# Patient Record
Sex: Female | Born: 1955 | Race: White | Hispanic: No | Marital: Married | Smoking: Never smoker
Health system: Southern US, Community
[De-identification: ages and names within clinical notes are randomized; demographics above are authoritative.]

## PROBLEM LIST (undated history)

## (undated) DIAGNOSIS — I4891 Unspecified atrial fibrillation: Secondary | ICD-10-CM

## (undated) DIAGNOSIS — M1990 Unspecified osteoarthritis, unspecified site: Secondary | ICD-10-CM

## (undated) DIAGNOSIS — J45909 Unspecified asthma, uncomplicated: Secondary | ICD-10-CM

## (undated) DIAGNOSIS — B54 Unspecified malaria: Secondary | ICD-10-CM

## (undated) DIAGNOSIS — F419 Anxiety disorder, unspecified: Secondary | ICD-10-CM

## (undated) DIAGNOSIS — I499 Cardiac arrhythmia, unspecified: Secondary | ICD-10-CM

## (undated) DIAGNOSIS — D649 Anemia, unspecified: Secondary | ICD-10-CM

## (undated) DIAGNOSIS — E785 Hyperlipidemia, unspecified: Secondary | ICD-10-CM

## (undated) DIAGNOSIS — J189 Pneumonia, unspecified organism: Secondary | ICD-10-CM

## (undated) DIAGNOSIS — K759 Inflammatory liver disease, unspecified: Secondary | ICD-10-CM

## (undated) DIAGNOSIS — G4733 Obstructive sleep apnea (adult) (pediatric): Secondary | ICD-10-CM

## (undated) DIAGNOSIS — K219 Gastro-esophageal reflux disease without esophagitis: Secondary | ICD-10-CM

## (undated) DIAGNOSIS — I10 Essential (primary) hypertension: Secondary | ICD-10-CM

## (undated) DIAGNOSIS — D369 Benign neoplasm, unspecified site: Secondary | ICD-10-CM

## (undated) DIAGNOSIS — A159 Respiratory tuberculosis unspecified: Secondary | ICD-10-CM

## (undated) DIAGNOSIS — Z9289 Personal history of other medical treatment: Secondary | ICD-10-CM

## (undated) DIAGNOSIS — F32A Depression, unspecified: Secondary | ICD-10-CM

## (undated) DIAGNOSIS — A6920 Lyme disease, unspecified: Secondary | ICD-10-CM

## (undated) DIAGNOSIS — R519 Headache, unspecified: Secondary | ICD-10-CM

## (undated) DIAGNOSIS — E063 Autoimmune thyroiditis: Secondary | ICD-10-CM

## (undated) DIAGNOSIS — Q249 Congenital malformation of heart, unspecified: Secondary | ICD-10-CM

## (undated) HISTORY — DX: Cardiac arrhythmia, unspecified: I49.9

## (undated) HISTORY — DX: Essential (primary) hypertension: I10

## (undated) HISTORY — DX: Gastro-esophageal reflux disease without esophagitis: K21.9

## (undated) HISTORY — PX: VAGINAL HYSTERECTOMY: SHX2639

## (undated) HISTORY — DX: Benign neoplasm, unspecified site: D36.9

## (undated) HISTORY — PX: RECTOCELE REPAIR: SHX761

## (undated) HISTORY — PX: COLONOSCOPY: SHX174

## (undated) HISTORY — DX: Unspecified malaria: B54

## (undated) HISTORY — DX: Lyme disease, unspecified: A69.20

## (undated) HISTORY — DX: Personal history of other medical treatment: Z92.89

## (undated) HISTORY — PX: ABDOMINAL HYSTERECTOMY: SHX81

## (undated) HISTORY — PX: CARDIAC CATHETERIZATION: SHX172

## (undated) HISTORY — PX: ELBOW SURGERY: SHX618

## (undated) HISTORY — PX: OOPHORECTOMY: SHX86

## (undated) HISTORY — DX: Hyperlipidemia, unspecified: E78.5

## (undated) HISTORY — PX: ECTOPIC PREGNANCY SURGERY: SHX613

---

## 2013-02-14 HISTORY — PX: COLONOSCOPY: SHX174

## 2020-02-15 DIAGNOSIS — Z01419 Encounter for gynecological examination (general) (routine) without abnormal findings: Secondary | ICD-10-CM

## 2020-02-15 HISTORY — DX: Encounter for gynecological examination (general) (routine) without abnormal findings: Z01.419

## 2020-07-02 ENCOUNTER — Ambulatory Visit (HOSPITAL_COMMUNITY)
Admission: RE | Admit: 2020-07-02 | Discharge: 2020-07-02 | Disposition: A | Discharge status: Home / Self Care | Payer: Medicare Other | Source: Ambulatory Visit | Attending: Cardiology | Admitting: Cardiology

## 2020-07-02 ENCOUNTER — Other Ambulatory Visit: Payer: Self-pay

## 2020-07-02 ENCOUNTER — Other Ambulatory Visit (HOSPITAL_COMMUNITY): Payer: Self-pay | Admitting: Optometry

## 2020-07-02 DIAGNOSIS — G453 Amaurosis fugax: Secondary | ICD-10-CM | POA: Diagnosis present

## 2020-08-11 ENCOUNTER — Encounter (HOSPITAL_COMMUNITY): Payer: Self-pay | Admitting: Emergency Medicine

## 2020-08-11 ENCOUNTER — Other Ambulatory Visit: Payer: Self-pay

## 2020-08-11 ENCOUNTER — Emergency Department (HOSPITAL_COMMUNITY): Payer: Medicare Other

## 2020-08-11 ENCOUNTER — Emergency Department (HOSPITAL_COMMUNITY)
Admission: EM | Admit: 2020-08-11 | Discharge: 2020-08-11 | Disposition: A | Discharge status: Home / Self Care | Payer: Medicare Other | Attending: Emergency Medicine | Admitting: Emergency Medicine

## 2020-08-11 DIAGNOSIS — I160 Hypertensive urgency: Secondary | ICD-10-CM | POA: Diagnosis not present

## 2020-08-11 DIAGNOSIS — R519 Headache, unspecified: Secondary | ICD-10-CM

## 2020-08-11 DIAGNOSIS — R079 Chest pain, unspecified: Secondary | ICD-10-CM | POA: Diagnosis present

## 2020-08-11 HISTORY — DX: Autoimmune thyroiditis: E06.3

## 2020-08-11 LAB — COMPREHENSIVE METABOLIC PANEL
ALT: 31 U/L (ref 0–44)
AST: 29 U/L (ref 15–41)
Albumin: 4 g/dL (ref 3.5–5.0)
Alkaline Phosphatase: 80 U/L (ref 38–126)
Anion gap: 7 (ref 5–15)
BUN: 15 mg/dL (ref 8–23)
CO2: 25 mmol/L (ref 22–32)
Calcium: 9.4 mg/dL (ref 8.9–10.3)
Chloride: 109 mmol/L (ref 98–111)
Creatinine, Ser: 0.79 mg/dL (ref 0.44–1.00)
GFR, Estimated: >60 mL/min (ref >60)
Glucose, Bld: 115 mg/dL — ABNORMAL HIGH (ref 70–99)
Potassium: 4.2 mmol/L (ref 3.5–5.1)
Sodium: 141 mmol/L (ref 135–145)
Total Bilirubin: 0.4 mg/dL (ref 0.3–1.2)
Total Protein: 6.6 g/dL (ref 6.5–8.1)

## 2020-08-11 LAB — TROPONIN I (HIGH SENSITIVITY)
Troponin I (High Sensitivity): 11 ng/L (ref <18)
Troponin I (High Sensitivity): 11 ng/L (ref <18)

## 2020-08-11 LAB — CBC
HCT: 42.8 % (ref 36.0–46.0)
Hemoglobin: 13.4 g/dL (ref 12.0–15.0)
MCH: 28.6 pg (ref 26.0–34.0)
MCHC: 31.3 g/dL (ref 30.0–36.0)
MCV: 91.5 fL (ref 80.0–100.0)
Platelets: 267 10*3/uL (ref 150–400)
RBC: 4.68 MIL/uL (ref 3.87–5.11)
RDW: 13.1 % (ref 11.5–15.5)
WBC: 6.9 10*3/uL (ref 4.0–10.5)
nRBC: 0 % (ref 0.0–0.2)

## 2020-08-11 LAB — LIPASE, BLOOD: Lipase: 34 U/L (ref 11–51)

## 2020-08-11 MED ORDER — DIPHENHYDRAMINE HCL 50 MG/ML IJ SOLN
25.0000 mg | Freq: Once | INTRAMUSCULAR | Status: AC
  Administered 2020-08-11: 25 mg via INTRAVENOUS
  Filled 2020-08-11: qty 1

## 2020-08-11 MED ORDER — METOPROLOL SUCCINATE ER 25 MG PO TB24: 25.0000 mg | ORAL_TABLET | Freq: Every day | ORAL | 0 refills | Status: AC

## 2020-08-11 MED ORDER — LORAZEPAM 2 MG/ML IJ SOLN
INTRAMUSCULAR | Status: AC
  Filled 2020-08-11: qty 1

## 2020-08-11 MED ORDER — SODIUM CHLORIDE 0.9 % IV BOLUS
1000.0000 mL | Freq: Once | INTRAVENOUS | Status: AC
  Administered 2020-08-11: 1000 mL via INTRAVENOUS

## 2020-08-11 MED ORDER — PROCHLORPERAZINE EDISYLATE 10 MG/2ML IJ SOLN
10.0000 mg | Freq: Once | INTRAMUSCULAR | Status: AC
  Administered 2020-08-11: 10 mg via INTRAVENOUS
  Filled 2020-08-11: qty 2

## 2020-08-11 MED ORDER — METOPROLOL TARTRATE 5 MG/5ML IV SOLN
10.0000 mg | INTRAVENOUS | Status: DC | PRN
  Administered 2020-08-11 (×3): 10 mg via INTRAVENOUS
  Filled 2020-08-11 (×3): qty 10

## 2020-08-11 MED ORDER — KETOROLAC TROMETHAMINE 30 MG/ML IJ SOLN
15.0000 mg | Freq: Once | INTRAMUSCULAR | Status: AC
  Administered 2020-08-11: 15 mg via INTRAVENOUS
  Filled 2020-08-11: qty 1

## 2020-08-11 NOTE — ED Notes (Signed)
Pt stated she feels like she is having a

## 2020-08-11 NOTE — ED Notes (Signed)
Pt stated she is feeling like she is anxious and BP 211/109. Notified the provider.

## 2020-08-11 NOTE — Discharge Instructions (Addendum)
As discussed, your evaluation today has been largely reassuring.  But, it is important that you monitor your condition carefully, and do not hesitate to return to the ED if you develop new, or concerning changes in your condition. ? ?Otherwise, please follow-up with your physician for appropriate ongoing care. ? ?

## 2020-08-11 NOTE — ED Notes (Signed)
Pt ambulated to the restroom w/o incident.

## 2020-08-11 NOTE — ED Triage Notes (Signed)
Pt reports intermittent chest pain that is worse when lying down since Thursday.  Also reports headache and nausea.  Denies SOB.

## 2020-08-11 NOTE — ED Provider Notes (Signed)
[facility] EM[facility] DEPARTMENT Provider Note   CSN: [id number] Arrival date & time: 08/11/20  1345     History Chief Complaint  Patient presents with   Chest Pain    Tamara Morgan is a 65 y.o. female.  HPI Patient presents with multiple concerns, including ongoing left-sided chest pain.  She is here with her husband who assists with history.  When she acknowledges multiple medical issues, but states that she is generally well until but 4 days ago.  Since that time she has had intermittent left-sided chest pain, headache, fatigue, nausea, anorexia.  No fever, no syncope, no focal weakness.  No relief with anything.  She notes ongoing therapy with multiple medication, provided at [facility].     Past Medical History:  Diagnosis Date   Hashimoto's disease     There are no problems to display for this patient.   History reviewed. No pertinent surgical history.   OB History   No obstetric history on file.     No family history on file.  Social History   Tobacco Use   Smoking status: Never   Smokeless tobacco: Never  Substance Use Topics   Alcohol use: Yes   Drug use: Never    Home Medications Prior to Admission medications   Not on File    Allergies    Patient has no allergy information on record.  Review of Systems   Review of Systems  Constitutional:        Per HPI, otherwise negative  HENT:         Per HPI, otherwise negative  Respiratory:         Per HPI, otherwise negative  Cardiovascular:        Per HPI, otherwise negative  Gastrointestinal:  Negative for vomiting.  Endocrine:       Negative aside from HPI  Genitourinary:        Neg aside from HPI   Musculoskeletal:        Per HPI, otherwise negative  Skin: Negative.   Neurological:  Positive for headaches. Negative for syncope.   Physical Exam Updated Vital Signs BP 133/68   Pulse (!) 59   Temp 98.2 F (36.8 C) (Oral)   Resp 20   Ht 5\' 8"  (1.727 m)   Wt  114.8 kg   SpO2 97%   BMI 38.47 kg/m   Physical Exam Vitals and nursing note reviewed.  Constitutional:      General: She is not in acute distress.    Appearance: She is obese. She is not diaphoretic.  HENT:     Head: Normocephalic and atraumatic.  Eyes:     Conjunctiva/sclera: Conjunctivae normal.  Cardiovascular:     Rate and Rhythm: Normal rate and regular rhythm.  Pulmonary:     Effort: Pulmonary effort is normal. No respiratory distress.     Breath sounds: Normal breath sounds. No stridor.  Abdominal:     General: There is no distension.  Skin:    General: Skin is warm and dry.  Neurological:     General: No focal deficit present.     Mental Status: She is alert and oriented to person, place, and time.     Cranial Nerves: No cranial nerve deficit.    ED Results / Procedures / Treatments   Labs (all labs ordered are listed, but only abnormal results are displayed) Labs Reviewed  COMPREHENSIVE METABOLIC PANEL - Abnormal; Notable for the following components:  Result Value   Glucose, Bld 115 (*)    All other components within normal limits  CBC  LIPASE, BLOOD  TROPONIN I (HIGH SENSITIVITY)  TROPONIN I (HIGH SENSITIVITY)    EKG EKG Interpretation  Date/Time:  Tuesday August 11 2020 14:35:50 EDT Ventricular Rate:  73 PR Interval:  182 QRS Duration: 80 QT Interval:  382 QTC Calculation: 420 R Axis:   92 Text Interpretation: Sinus rhythm with Premature atrial complexes Rightward axis Low voltage QRS Nonspecific ST and T wave abnormality Artifact Abnormal ECG Confirmed by Carmin Muskrat [phone]) on 08/11/2020 4:07:47 PM  Radiology DG Chest 2 View  Result Date: 08/11/2020 CLINICAL DATA:  Chest pain. EXAM: CHEST - 2 VIEW COMPARISON:  None. FINDINGS: The heart size and mediastinal contours are within normal limits. Marked severity calcification of the aortic arch is noted. Both lungs are clear. The visualized skeletal structures are unremarkable. IMPRESSION: No  active cardiopulmonary disease. Electronically Signed   By: Virgina Norfolk M.D.   On: 08/11/2020 15:16    Procedures Procedures   Medications Ordered in ED Medications  sodium chloride 0.9 % bolus 1,000 mL (has no administration in time range)  ketorolac (TORADOL) 30 MG/ML injection 15 mg (has no administration in time range)  prochlorperazine (COMPAZINE) injection 10 mg (has no administration in time range)    ED Course  I have reviewed the triage vital signs and the nursing notes.  Pertinent labs & imaging results that were available during my care of the patient were reviewed by me and considered in my medical decision making (see chart for details).  After the initial evaluation I obtained some charts, as available from outside hospital.  Notable for patient's ongoing outpatient evaluation for multiple medical issues including Hashimoto's thyroiditis, obesity, fatigue, hypertension.  Patient received every 10 minutes metoprolol 10 mg boluses given her hypertensive urgency.  6:51 PM Patient continues to complain of a headache.  After some delay, with the assistance of our pharmacy tech we are able to do a medication reconciliation, and at the patient's allergies, which she had previously indicated were substantial. Now after discussion on her allergies medication provided. 10:20 PM Patient markedly better.  After what possibly was a medication reaction, with anxiousness, unsettled sensation, patient presented to the Benadryl.  Now, headache is resolved, blood pressure has improved, MAP less than 100.  No evidence for ACS, pneumonia, low suspicion for PE with no hypoxia, increased work of breathing no evidence for endorgan damage from her hypertensive urgency and headache is resolved with medication.  Patient discharged in stable condition with new prescription for antihypertensives, close outpatient follow-up. MDM Rules/Calculators/A&P MDM Number of Diagnoses or Management  Options Bad headache: new, needed workup Hypertensive urgency: new, needed workup   Amount and/or Complexity of Data Reviewed Clinical lab tests: ordered and reviewed Tests in the radiology section of CPT: ordered and reviewed Tests in the medicine section of CPT: reviewed and ordered Decide to obtain previous medical records or to obtain history from someone other than the patient: yes Obtain history from someone other than the patient: yes Review and summarize past medical records: yes Independent visualization of images, tracings, or specimens: yes  Risk of Complications, Morbidity, and/or Mortality Presenting problems: high Diagnostic procedures: high Management options: high  Critical Care Total time providing critical care: 30-74 minutes (35)  Patient Progress Patient progress: improved   Final Clinical Impression(s) / ED Diagnoses Final diagnoses:  Hypertensive urgency  Bad headache    Rx / DC Orders ED  Discharge Orders          Ordered    metoprolol succinate (TOPROL-XL) 25 MG 24 hr tablet  Daily        08/11/20 2221             Carmin Muskrat, MD 08/11/20 2222

## 2020-08-11 NOTE — ED Notes (Signed)
Informed pt that we are waiting for med reconciliation and provider is aware of pain medication.

## 2020-08-11 NOTE — ED Provider Notes (Signed)
Emergency Medicine Provider Triage Evaluation Note  Tamara Morgan , a 65 y.o. female  was evaluated in triage.  Pt complains of left chest pain a few days ago.  Review of Systems  Positive: Chest pain Negative: Abd pain, sob  Physical Exam  BP (!) 155/75   Pulse 64   Temp 98.2 F (36.8 C) (Oral)   Resp 20   SpO2 93%  Gen:   Awake, no distress   Resp:  Normal effort  MSK:   Moves extremities without difficulty   Medical Decision Making  Medically screening exam initiated at 2:39 PM.  Appropriate orders placed.  Tamara Morgan was informed that the remainder of the evaluation will be completed by another provider, this initial triage assessment does not replace that evaluation, and the importance of remaining in the ED until their evaluation is complete.     Bishop Dublin 08/11/20 1441    Carmin Muskrat, MD 08/11/20 [phone]

## 2020-08-11 NOTE — ED Notes (Signed)
Pt stated at 19:02 she felt a flutter in her heart. She noticed her HR jump to 100 on the monitor and PVC on the monitor. Notified provider.

## 2020-08-21 ENCOUNTER — Telehealth: Payer: Self-pay

## 2020-08-21 NOTE — Telephone Encounter (Signed)
Attempt made to contact Tamara Morgan is a 65 y.o. female re: new patient appt Pt was not available.  LM on the VM for the patient to call me back.

## 2020-10-13 ENCOUNTER — Ambulatory Visit: Payer: Medicare Other | Admitting: Obstetrics and Gynecology

## 2021-03-18 DIAGNOSIS — I10 Essential (primary) hypertension: Secondary | ICD-10-CM | POA: Diagnosis not present

## 2021-03-18 DIAGNOSIS — Z9989 Dependence on other enabling machines and devices: Secondary | ICD-10-CM | POA: Diagnosis not present

## 2021-03-18 DIAGNOSIS — G4733 Obstructive sleep apnea (adult) (pediatric): Secondary | ICD-10-CM | POA: Diagnosis not present

## 2021-03-22 DIAGNOSIS — D169 Benign neoplasm of bone and articular cartilage, unspecified: Secondary | ICD-10-CM | POA: Diagnosis not present

## 2021-03-22 DIAGNOSIS — I10 Essential (primary) hypertension: Secondary | ICD-10-CM | POA: Diagnosis not present

## 2021-03-22 DIAGNOSIS — M899 Disorder of bone, unspecified: Secondary | ICD-10-CM | POA: Diagnosis not present

## 2021-03-22 DIAGNOSIS — E669 Obesity, unspecified: Secondary | ICD-10-CM | POA: Diagnosis not present

## 2021-03-25 DIAGNOSIS — N393 Stress incontinence (female) (male): Secondary | ICD-10-CM | POA: Diagnosis not present

## 2021-04-28 DIAGNOSIS — G4733 Obstructive sleep apnea (adult) (pediatric): Secondary | ICD-10-CM | POA: Diagnosis not present

## 2021-05-05 DIAGNOSIS — I8311 Varicose veins of right lower extremity with inflammation: Secondary | ICD-10-CM | POA: Diagnosis not present

## 2021-05-15 DIAGNOSIS — H04123 Dry eye syndrome of bilateral lacrimal glands: Secondary | ICD-10-CM | POA: Diagnosis not present

## 2021-05-19 DIAGNOSIS — I8312 Varicose veins of left lower extremity with inflammation: Secondary | ICD-10-CM | POA: Diagnosis not present

## 2021-06-15 DIAGNOSIS — I8312 Varicose veins of left lower extremity with inflammation: Secondary | ICD-10-CM | POA: Diagnosis not present

## 2021-06-15 DIAGNOSIS — I8311 Varicose veins of right lower extremity with inflammation: Secondary | ICD-10-CM | POA: Diagnosis not present

## 2021-06-17 DIAGNOSIS — J3089 Other allergic rhinitis: Secondary | ICD-10-CM | POA: Diagnosis not present

## 2021-06-17 DIAGNOSIS — R0600 Dyspnea, unspecified: Secondary | ICD-10-CM | POA: Diagnosis not present

## 2021-06-30 DIAGNOSIS — I8312 Varicose veins of left lower extremity with inflammation: Secondary | ICD-10-CM | POA: Diagnosis not present

## 2021-06-30 DIAGNOSIS — I8311 Varicose veins of right lower extremity with inflammation: Secondary | ICD-10-CM | POA: Diagnosis not present

## 2021-07-07 DIAGNOSIS — I8311 Varicose veins of right lower extremity with inflammation: Secondary | ICD-10-CM | POA: Diagnosis not present

## 2021-07-29 DIAGNOSIS — G4733 Obstructive sleep apnea (adult) (pediatric): Secondary | ICD-10-CM | POA: Diagnosis not present

## 2021-08-20 DIAGNOSIS — H04123 Dry eye syndrome of bilateral lacrimal glands: Secondary | ICD-10-CM | POA: Diagnosis not present

## 2021-08-27 DIAGNOSIS — I8311 Varicose veins of right lower extremity with inflammation: Secondary | ICD-10-CM | POA: Diagnosis not present

## 2021-08-27 DIAGNOSIS — H2000 Unspecified acute and subacute iridocyclitis: Secondary | ICD-10-CM | POA: Diagnosis not present

## 2021-08-27 DIAGNOSIS — R2232 Localized swelling, mass and lump, left upper limb: Secondary | ICD-10-CM | POA: Diagnosis not present

## 2021-08-27 DIAGNOSIS — E8881 Metabolic syndrome: Secondary | ICD-10-CM | POA: Diagnosis not present

## 2021-08-27 DIAGNOSIS — Z0000 Encounter for general adult medical examination without abnormal findings: Secondary | ICD-10-CM | POA: Diagnosis not present

## 2021-08-27 DIAGNOSIS — H40051 Ocular hypertension, right eye: Secondary | ICD-10-CM | POA: Diagnosis not present

## 2021-08-27 DIAGNOSIS — E559 Vitamin D deficiency, unspecified: Secondary | ICD-10-CM | POA: Diagnosis not present

## 2021-08-27 DIAGNOSIS — I8001 Phlebitis and thrombophlebitis of superficial vessels of right lower extremity: Secondary | ICD-10-CM | POA: Diagnosis not present

## 2021-08-27 DIAGNOSIS — E063 Autoimmune thyroiditis: Secondary | ICD-10-CM | POA: Diagnosis not present

## 2021-08-30 DIAGNOSIS — H2011 Chronic iridocyclitis, right eye: Secondary | ICD-10-CM | POA: Diagnosis not present

## 2021-08-30 DIAGNOSIS — H40051 Ocular hypertension, right eye: Secondary | ICD-10-CM | POA: Diagnosis not present

## 2021-08-30 DIAGNOSIS — I471 Supraventricular tachycardia: Secondary | ICD-10-CM | POA: Diagnosis not present

## 2021-08-30 DIAGNOSIS — R0789 Other chest pain: Secondary | ICD-10-CM | POA: Diagnosis not present

## 2021-08-30 DIAGNOSIS — I10 Essential (primary) hypertension: Secondary | ICD-10-CM | POA: Diagnosis not present

## 2021-08-30 DIAGNOSIS — H16103 Unspecified superficial keratitis, bilateral: Secondary | ICD-10-CM | POA: Diagnosis not present

## 2021-08-30 DIAGNOSIS — H401131 Primary open-angle glaucoma, bilateral, mild stage: Secondary | ICD-10-CM | POA: Diagnosis not present

## 2021-08-30 DIAGNOSIS — R002 Palpitations: Secondary | ICD-10-CM | POA: Diagnosis not present

## 2021-09-02 DIAGNOSIS — I8311 Varicose veins of right lower extremity with inflammation: Secondary | ICD-10-CM | POA: Diagnosis not present

## 2021-09-02 DIAGNOSIS — I8001 Phlebitis and thrombophlebitis of superficial vessels of right lower extremity: Secondary | ICD-10-CM | POA: Diagnosis not present

## 2021-09-06 DIAGNOSIS — R2232 Localized swelling, mass and lump, left upper limb: Secondary | ICD-10-CM | POA: Diagnosis not present

## 2021-09-08 DIAGNOSIS — H524 Presbyopia: Secondary | ICD-10-CM | POA: Diagnosis not present

## 2021-09-14 DIAGNOSIS — E063 Autoimmune thyroiditis: Secondary | ICD-10-CM | POA: Diagnosis not present

## 2021-09-15 DIAGNOSIS — G4733 Obstructive sleep apnea (adult) (pediatric): Secondary | ICD-10-CM | POA: Diagnosis not present

## 2021-09-15 DIAGNOSIS — Z9989 Dependence on other enabling machines and devices: Secondary | ICD-10-CM | POA: Diagnosis not present

## 2021-09-15 DIAGNOSIS — I10 Essential (primary) hypertension: Secondary | ICD-10-CM | POA: Diagnosis not present

## 2021-10-17 ENCOUNTER — Other Ambulatory Visit: Payer: Self-pay

## 2021-10-17 ENCOUNTER — Emergency Department (HOSPITAL_BASED_OUTPATIENT_CLINIC_OR_DEPARTMENT_OTHER): Payer: Medicare Other

## 2021-10-17 ENCOUNTER — Encounter (HOSPITAL_BASED_OUTPATIENT_CLINIC_OR_DEPARTMENT_OTHER): Payer: Self-pay | Admitting: Emergency Medicine

## 2021-10-17 ENCOUNTER — Emergency Department (HOSPITAL_BASED_OUTPATIENT_CLINIC_OR_DEPARTMENT_OTHER)
Admission: EM | Admit: 2021-10-17 | Discharge: 2021-10-18 | Disposition: A | Discharge status: Home / Self Care | Payer: Medicare Other | Attending: Emergency Medicine | Admitting: Emergency Medicine

## 2021-10-17 DIAGNOSIS — K760 Fatty (change of) liver, not elsewhere classified: Secondary | ICD-10-CM | POA: Diagnosis not present

## 2021-10-17 DIAGNOSIS — R1031 Right lower quadrant pain: Secondary | ICD-10-CM

## 2021-10-17 DIAGNOSIS — R109 Unspecified abdominal pain: Secondary | ICD-10-CM | POA: Diagnosis not present

## 2021-10-17 HISTORY — DX: Congenital malformation of heart, unspecified: Q24.9

## 2021-10-17 HISTORY — DX: Obstructive sleep apnea (adult) (pediatric): G47.33

## 2021-10-17 LAB — URINALYSIS, ROUTINE W REFLEX MICROSCOPIC
Bilirubin Urine: NEGATIVE
Glucose, UA: NEGATIVE mg/dL
Hgb urine dipstick: NEGATIVE
Ketones, ur: NEGATIVE mg/dL
Nitrite: NEGATIVE
Protein, ur: NEGATIVE mg/dL
Specific Gravity, Urine: 1.015 (ref 1.005–1.030)
pH: 6 (ref 5.0–8.0)

## 2021-10-17 LAB — COMPREHENSIVE METABOLIC PANEL
ALT: 25 U/L (ref 0–44)
AST: 25 U/L (ref 15–41)
Albumin: 3.9 g/dL (ref 3.5–5.0)
Alkaline Phosphatase: 71 U/L (ref 38–126)
Anion gap: 6 (ref 5–15)
BUN: 13 mg/dL (ref 8–23)
CO2: 27 mmol/L (ref 22–32)
Calcium: 9 mg/dL (ref 8.9–10.3)
Chloride: 109 mmol/L (ref 98–111)
Creatinine, Ser: 0.81 mg/dL (ref 0.44–1.00)
GFR, Estimated: >60 mL/min (ref >60)
Glucose, Bld: 115 mg/dL — ABNORMAL HIGH (ref 70–99)
Potassium: 3.9 mmol/L (ref 3.5–5.1)
Sodium: 142 mmol/L (ref 135–145)
Total Bilirubin: 0.4 mg/dL (ref 0.3–1.2)
Total Protein: 6.9 g/dL (ref 6.5–8.1)

## 2021-10-17 LAB — CBC WITH DIFFERENTIAL/PLATELET
Abs Immature Granulocytes: 0.04 10*3/uL (ref 0.00–0.07)
Basophils Absolute: 0 10*3/uL (ref 0.0–0.1)
Basophils Relative: 1 %
Eosinophils Absolute: 0.2 10*3/uL (ref 0.0–0.5)
Eosinophils Relative: 3 %
HCT: 39.2 % (ref 36.0–46.0)
Hemoglobin: 12.9 g/dL (ref 12.0–15.0)
Immature Granulocytes: 1 %
Lymphocytes Relative: 35 %
Lymphs Abs: 2.4 10*3/uL (ref 0.7–4.0)
MCH: 28.5 pg (ref 26.0–34.0)
MCHC: 32.9 g/dL (ref 30.0–36.0)
MCV: 86.7 fL (ref 80.0–100.0)
Monocytes Absolute: 0.5 10*3/uL (ref 0.1–1.0)
Monocytes Relative: 8 %
Neutro Abs: 3.7 10*3/uL (ref 1.7–7.7)
Neutrophils Relative %: 52 %
Platelets: 251 10*3/uL (ref 150–400)
RBC: 4.52 MIL/uL (ref 3.87–5.11)
RDW: 13.2 % (ref 11.5–15.5)
WBC: 6.9 10*3/uL (ref 4.0–10.5)
nRBC: 0 % (ref 0.0–0.2)

## 2021-10-17 LAB — URINALYSIS, MICROSCOPIC (REFLEX)

## 2021-10-17 LAB — LIPASE, BLOOD: Lipase: 28 U/L (ref 11–51)

## 2021-10-17 MED ORDER — ONDANSETRON HCL 4 MG/2ML IJ SOLN
4.0000 mg | Freq: Once | INTRAMUSCULAR | Status: AC
  Administered 2021-10-18: 4 mg via INTRAVENOUS
  Filled 2021-10-17: qty 2

## 2021-10-17 MED ORDER — IOHEXOL 300 MG/ML  SOLN
100.0000 mL | Freq: Once | INTRAMUSCULAR | Status: AC | PRN
Start: 2021-10-17 — End: 2021-10-17
  Administered 2021-10-17: 100 mL via INTRAVENOUS

## 2021-10-17 MED ORDER — DIPHENHYDRAMINE HCL 50 MG/ML IJ SOLN
25.0000 mg | Freq: Once | INTRAMUSCULAR | Status: AC
  Administered 2021-10-17: 25 mg via INTRAVENOUS
  Filled 2021-10-17: qty 1

## 2021-10-17 NOTE — ED Provider Notes (Signed)
[facility] Provider Note   CSN: [id number] Arrival date & time: 10/17/21  2122     History  Chief Complaint  Patient presents with   Abdominal Pain    Maily Debarge is a 66 y.o. female.  Patient presents to the hospital complaining of right lower quadrant pain which began upon waking on September 1.  The patient states the pain is stayed in the right lower quadrant area and is slowly grown in size of affected area.  Patient currently rates pain is moderate to severe.  Patient endorses multiple episodes of loose stools over the past 2 days.  Patient states that today she became nauseated and lost her appetite.  She denies shortness of breath, emesis, chest pain, dysuria, hematuria.  Past medical history significant for Hashimoto's disease, obstructive sleep apnea.  Patient states that she has had a total hysterectomy.  HPI     Home Medications Prior to Admission medications   Medication Sig Start Date End Date Taking? Authorizing Provider  albuterol (VENTOLIN HFA) 108 (90 Base) MCG/ACT inhaler Inhale 1 puff into the lungs every 6 (six) hours as needed for wheezing or shortness of breath.    [provider]  famotidine (PEPCID) 20 MG tablet Take 20 mg by mouth in the morning and at bedtime.    [provider]  Fluticasone Furoate 50 MCG/ACT AEPB Place 1 spray into both nostrils daily.    [provider]  latanoprost (XALATAN) 0.005 % ophthalmic solution Place 1 drop into both eyes at bedtime.    [provider]  levocetirizine (XYZAL) 5 MG tablet Take 5 mg by mouth daily.    [provider]  metoprolol succinate (TOPROL-XL) 25 MG 24 hr tablet Take 1 tablet (25 mg total) by mouth daily. 08/11/20 09/10/20  Carmin Muskrat, MD  pantoprazole (PROTONIX) 40 MG tablet Take 40 mg by mouth every evening.    [provider]  PRESCRIPTION MEDICATION Place 1 suppository vaginally at bedtime. Medication: Vitamin E  200 U/MI Vaginal suppositories    [provider]      Allergies    Tramadol, Aspirin, Betadine [povidone iodine], Codeine, Cortisone, Gluten meal, Mushroom extract complex, Onion, Prednisone, Premarin [conjugated estrogens], Tine test [old tuberculin], Tomato, Valium [diazepam], Versed [midazolam], Wound dressing adhesive, and Iodine    Review of Systems   Review of Systems  Constitutional:  Positive for appetite change.  Respiratory:  Negative for shortness of breath.   Cardiovascular:  Negative for chest pain.  Gastrointestinal:  Positive for abdominal pain, diarrhea and nausea. Negative for blood in stool and vomiting.  Genitourinary:  Negative for dysuria.    Physical Exam Updated Vital Signs BP (!) 172/79   Pulse 69   Temp 98.7 F (37.1 C) (Oral)   Resp 16   Ht '5\' 8"'$  (1.727 m)   Wt 117.9 kg   SpO2 96%   BMI 39.53 kg/m  Physical Exam Vitals and nursing note reviewed.  Constitutional:      General: She is not in acute distress.    Appearance: She is obese.  HENT:     Head: Normocephalic and atraumatic.  Eyes:     Pupils: Pupils are equal, round, and reactive to light.  Cardiovascular:     Rate and Rhythm: Normal rate and regular rhythm.     Heart sounds: Normal heart sounds.  Pulmonary:     Effort: Pulmonary effort is normal.     Breath sounds: Normal breath sounds.  Abdominal:  General: There is no distension.     Palpations: Abdomen is soft.     Tenderness: There is abdominal tenderness in the right lower quadrant.  Skin:    General: Skin is warm and dry.     Capillary Refill: Capillary refill takes less than 2 seconds.  Neurological:     Mental Status: She is alert and oriented to person, place, and time.     ED Results / Procedures / Treatments   Labs (all labs ordered are listed, but only abnormal results are displayed) Labs Reviewed - No data to display  EKG None  Radiology No results found.  Procedures Procedures     Medications Ordered in ED Medications - No data to display  ED Course/ Medical Decision Making/ A&P                           Medical Decision Making  This patient presents to the ED for concern of right lower quadrant pain, this involves an extensive number of treatment options, and is a complaint that carries with it a high risk of complications and morbidity.  The differential diagnosis includes appendicitis, cholecystitis, gastritis, gastroenteritis, nephrolithiasis, and others   Co morbidities that complicate the patient evaluation  History of hysterectomy, obesity   Additional history obtained:  Additional history obtained from husband at bedside External records from outside source obtained and reviewed including outpatient cardiology visits for SVT   Lab Tests:  I Ordered, and personally interpreted labs.  The pertinent results include: Grossly normal CBC, urinalysis with trace leukocytes, few bacteria, 9 squamous epithelial cells (not consistent with infection), lipase 28, grossly normal CMP   Imaging Studies ordered:  I ordered imaging studies including CT abdomen pelvis I independently visualized and interpreted imaging which showed pending I agree with the radiologist interpretation   Problem List / ED Course / Critical interventions / Medication management   I ordered medication including Zofran for nausea, Benadryl for possible allergic reaction Reevaluation of the patient after these medicines showed that the patient improved I have reviewed the patients home medicines and have made adjustments as needed  Test / Admission - Considered:  Patient care being transferred to Dr. Deno Etienne. If CT is negative plan on discharge home.         Final Clinical Impression(s) / ED Diagnoses Final diagnoses:  None    Rx / DC Orders ED Discharge Orders     None         Ronny Bacon 10/18/21 0002    Malvin Johns, MD 10/18/21  1457

## 2021-10-17 NOTE — ED Triage Notes (Addendum)
Pt reports RLQ pain x 2 days, loose stool today; sometimes pain radiates to RUQ; +nausea

## 2021-10-18 MED ORDER — ONDANSETRON 4 MG PO TBDP: ORAL_TABLET | ORAL | 0 refills | Status: DC

## 2021-10-18 NOTE — Discharge Instructions (Signed)
Please return for worsening pain fever or inability to eat or drink.

## 2021-10-25 DIAGNOSIS — M1711 Unilateral primary osteoarthritis, right knee: Secondary | ICD-10-CM | POA: Diagnosis not present

## 2021-10-25 DIAGNOSIS — M898X6 Other specified disorders of bone, lower leg: Secondary | ICD-10-CM | POA: Diagnosis not present

## 2021-10-25 DIAGNOSIS — Z6841 Body Mass Index (BMI) 40.0 and over, adult: Secondary | ICD-10-CM | POA: Diagnosis not present

## 2021-10-26 DIAGNOSIS — R1084 Generalized abdominal pain: Secondary | ICD-10-CM | POA: Diagnosis not present

## 2021-10-26 DIAGNOSIS — R599 Enlarged lymph nodes, unspecified: Secondary | ICD-10-CM | POA: Diagnosis not present

## 2021-10-29 DIAGNOSIS — G4733 Obstructive sleep apnea (adult) (pediatric): Secondary | ICD-10-CM | POA: Diagnosis not present

## 2021-11-05 DIAGNOSIS — J208 Acute bronchitis due to other specified organisms: Secondary | ICD-10-CM | POA: Diagnosis not present

## 2021-11-11 IMAGING — DX DG CHEST 2V
2 series · 2 of 2 positions shown · non-contrast
Comparison: None.

CLINICAL DATA: Chest pain.

EXAM:
CHEST - 2 VIEW

[chest pa]
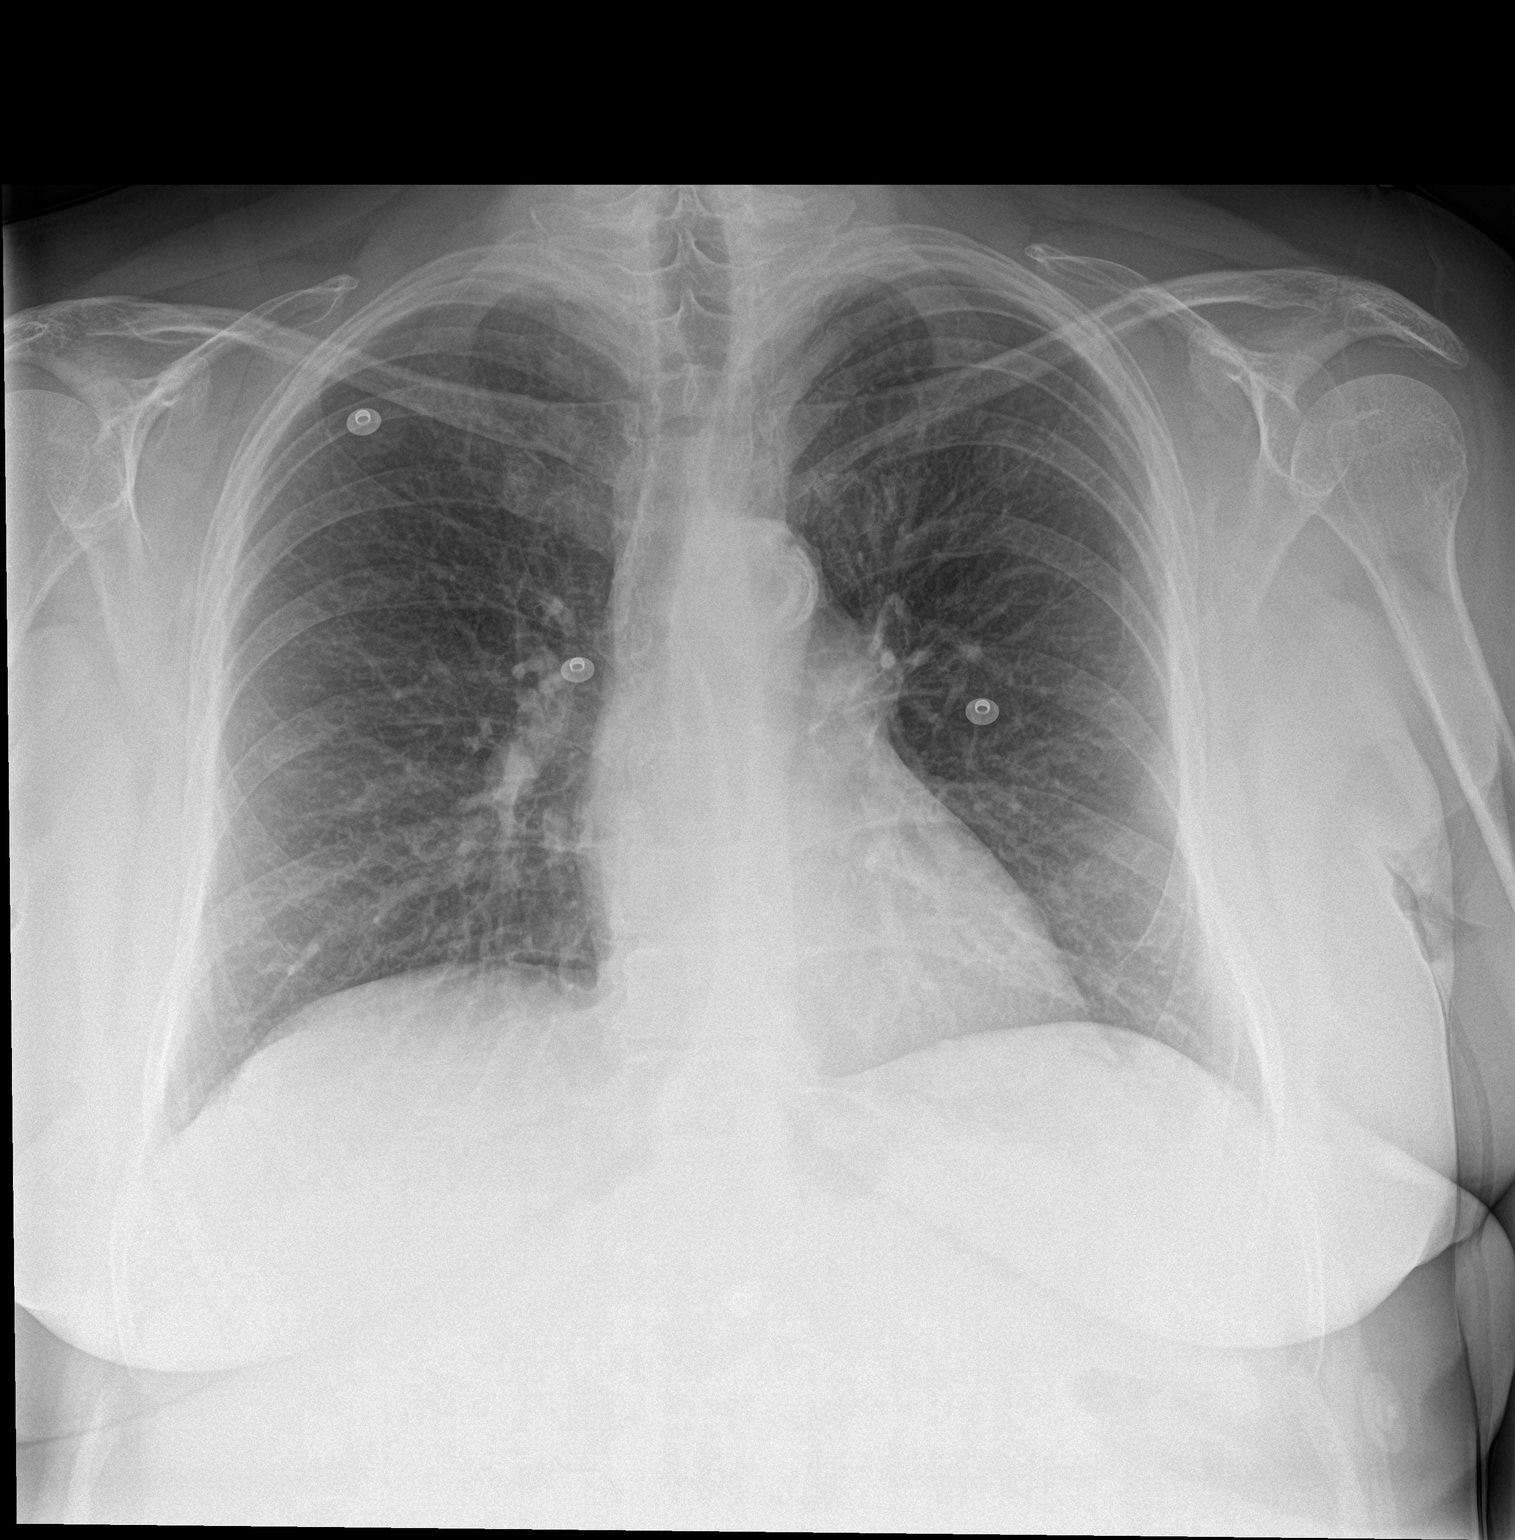

[chest lat]
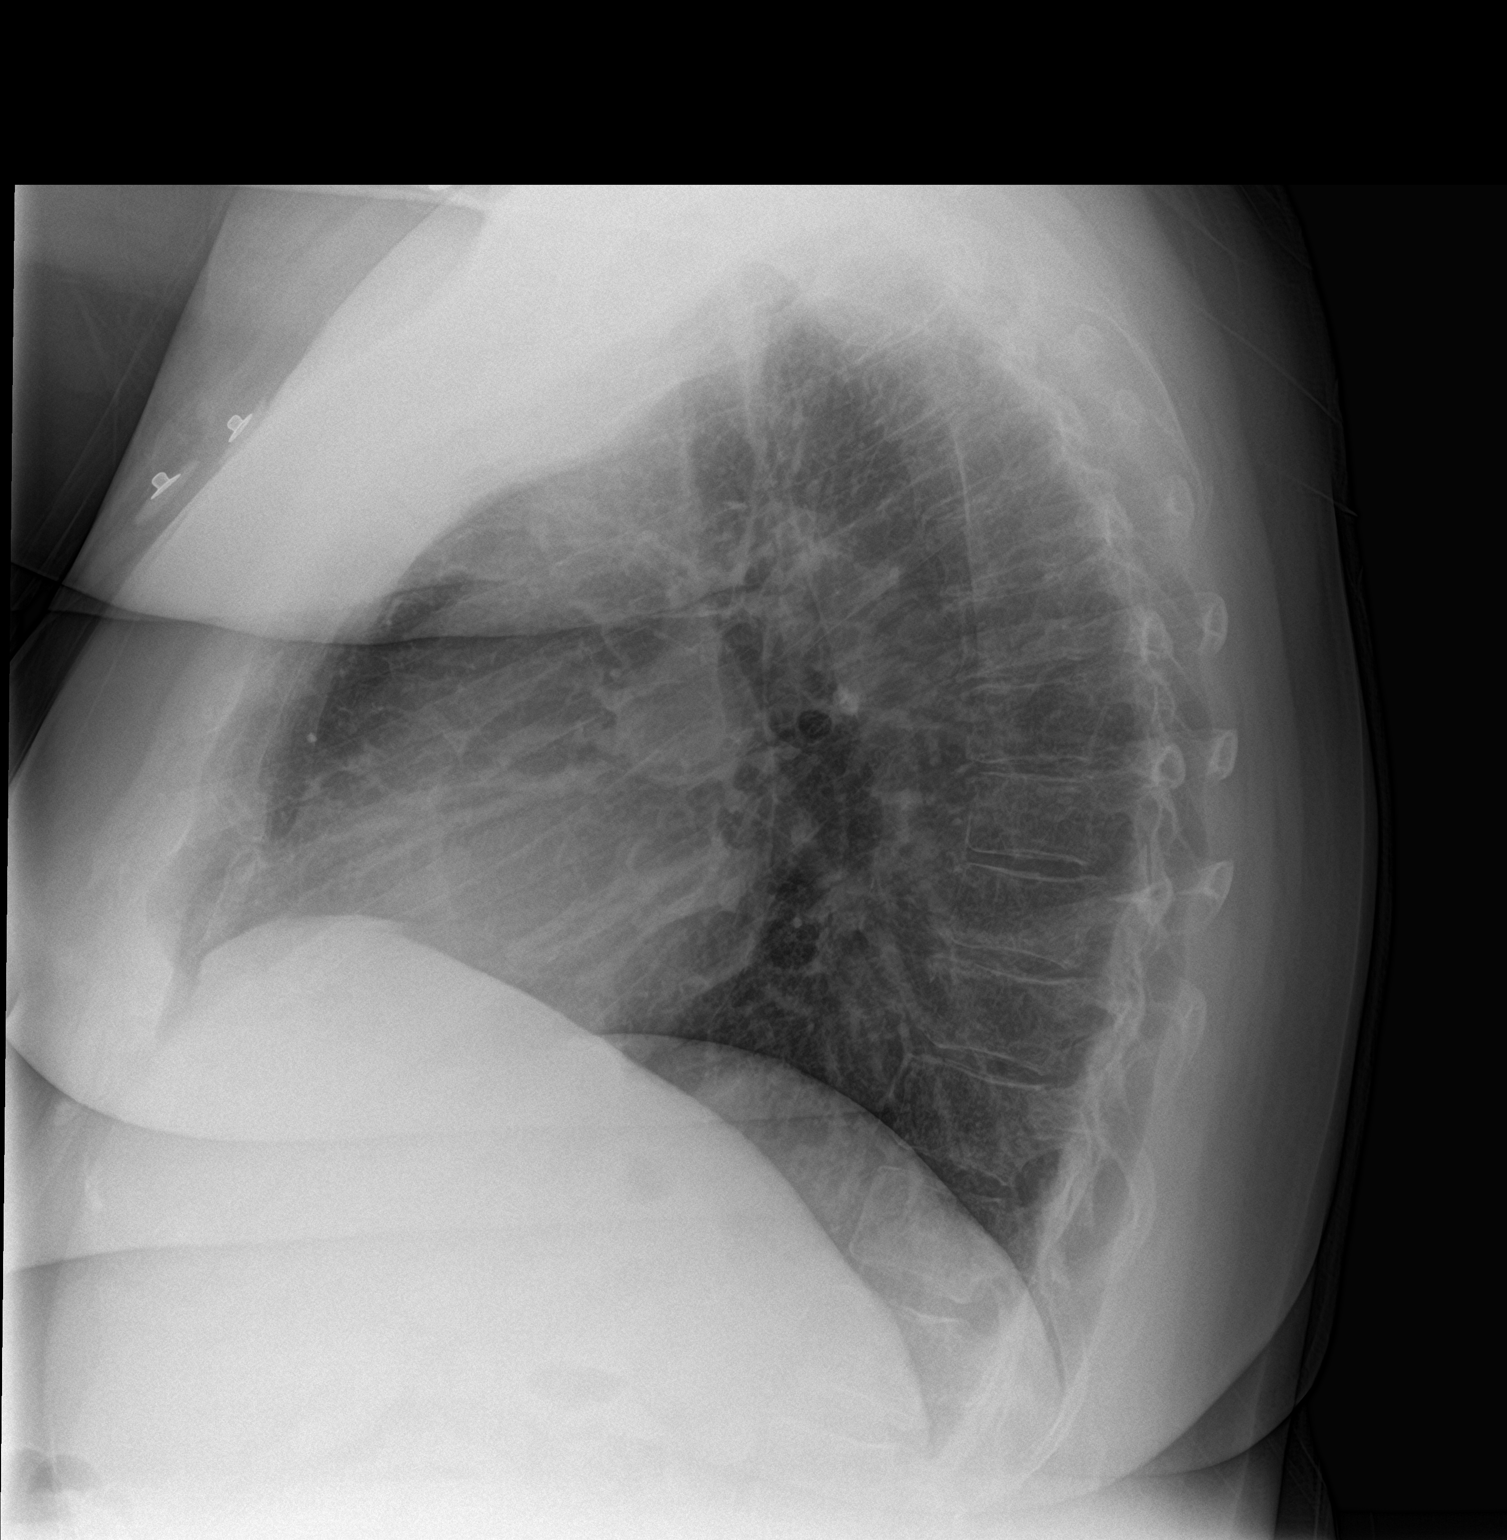

[2 of 2 positions shown; findings below may reference images not displayed]

FINDINGS: The heart size and mediastinal contours are within normal limits.
Marked severity calcification of the aortic arch is noted. Both
lungs are clear. The visualized skeletal structures are
unremarkable.
IMPRESSION: No active cardiopulmonary disease.

## 2021-11-12 ENCOUNTER — Other Ambulatory Visit: Payer: Self-pay

## 2021-11-12 ENCOUNTER — Emergency Department (HOSPITAL_BASED_OUTPATIENT_CLINIC_OR_DEPARTMENT_OTHER): Payer: Medicare Other

## 2021-11-12 ENCOUNTER — Emergency Department (HOSPITAL_BASED_OUTPATIENT_CLINIC_OR_DEPARTMENT_OTHER)
Admission: EM | Admit: 2021-11-12 | Discharge: 2021-11-12 | Disposition: A | Discharge status: Home / Self Care | Payer: Medicare Other | Attending: Emergency Medicine | Admitting: Emergency Medicine

## 2021-11-12 ENCOUNTER — Encounter (HOSPITAL_BASED_OUTPATIENT_CLINIC_OR_DEPARTMENT_OTHER): Payer: Self-pay

## 2021-11-12 ENCOUNTER — Other Ambulatory Visit (HOSPITAL_BASED_OUTPATIENT_CLINIC_OR_DEPARTMENT_OTHER): Payer: Self-pay

## 2021-11-12 DIAGNOSIS — R0781 Pleurodynia: Secondary | ICD-10-CM | POA: Diagnosis not present

## 2021-11-12 DIAGNOSIS — R051 Acute cough: Secondary | ICD-10-CM

## 2021-11-12 DIAGNOSIS — R059 Cough, unspecified: Secondary | ICD-10-CM | POA: Diagnosis not present

## 2021-11-12 DIAGNOSIS — J069 Acute upper respiratory infection, unspecified: Secondary | ICD-10-CM | POA: Insufficient documentation

## 2021-11-12 MED ORDER — BENZONATATE 100 MG PO CAPS: 100.0000 mg | ORAL_CAPSULE | Freq: Three times a day (TID) | ORAL | 0 refills | Status: DC

## 2021-11-12 MED ORDER — BENZONATATE 100 MG PO CAPS
100.0000 mg | ORAL_CAPSULE | Freq: Three times a day (TID) | ORAL | 0 refills | Status: DC
  Filled 2021-11-12: qty 21, 7d supply, fill #0

## 2021-11-12 NOTE — Discharge Instructions (Addendum)
You were seen today for an upper respiratory infection.  Your chest x-ray was reassuring for no signs of pneumonia.  I do recommend finishing the antibiotics as prescribed by your primary care provider.  Please take over-the-counter medication for symptom management as needed.  I have prescribed a cough medication called Tessalon Perles to be used as directed.  If you develop any life-threatening condition such as shortness of breath please return to the emergency department

## 2021-11-12 NOTE — ED Provider Notes (Addendum)
[facility] EM[facility] DEPARTMENT Provider Note   CSN: [id number] Arrival date & time: 11/12/21  1438     History  Chief Complaint  Patient presents with   Cough    Tamara Morgan is a 66 y.o. female.  Patient presents to the emergency department complaining of left-sided rib discomfort with coughing.  Patient states she has been sick intermittently over the past week.  She was evaluated at her primary care office last Friday and was negative for COVID and flu.  She states she has not been out of the house since that time.  She denies fevers, body aches, chills, shortness of breath, chest pain.  Endorses left-sided rib pain with coughing.  She states pain with deep inspiration.  Her chief complaint is the cough which has persisted for the past 4 weeks. Past medical history seen for Hashimoto's disease, OSA, cardiac anomaly  HPI     Home Medications Prior to Admission medications   Medication Sig Start Date End Date Taking? Authorizing Provider  albuterol (VENTOLIN HFA) 108 (90 Base) MCG/ACT inhaler Inhale 1 puff into the lungs every 6 (six) hours as needed for wheezing or shortness of breath.    [provider]  famotidine (PEPCID) 20 MG tablet Take 20 mg by mouth in the morning and at bedtime.    [provider]  Fluticasone Furoate 50 MCG/ACT AEPB Place 1 spray into both nostrils daily.    [provider]  latanoprost (XALATAN) 0.005 % ophthalmic solution Place 1 drop into both eyes at bedtime.    [provider]  levocetirizine (XYZAL) 5 MG tablet Take 5 mg by mouth daily.    [provider]  metoprolol succinate (TOPROL-XL) 25 MG 24 hr tablet Take 1 tablet (25 mg total) by mouth daily. 08/11/20 09/10/20  Carmin Muskrat, MD  ondansetron (ZOFRAN-ODT) 4 MG disintegrating tablet '4mg'$  ODT q4 hours prn nausea/vomit 10/18/21   Deno Etienne, DO  pantoprazole (PROTONIX) 40 MG tablet Take 40 mg by mouth every evening.    [provider]  PRESCRIPTION MEDICATION Place 1 suppository vaginally at bedtime. Medication: Vitamin E 200 U/MI Vaginal suppositories    [provider]      Allergies    Tramadol, Aspirin, Betadine [povidone iodine], Codeine, Cortisone, Gluten meal, Mushroom extract complex, Onion, Prednisone, Premarin [conjugated estrogens], Tine test [old tuberculin], Tomato, Valium [diazepam], Versed [midazolam], Wound dressing adhesive, and Iodine    Review of Systems   Review of Systems  Constitutional:  Negative for fever.  Respiratory:  Positive for cough. Negative for shortness of breath.   Cardiovascular:  Negative for chest pain.  Gastrointestinal:  Negative for abdominal pain, nausea and vomiting.  Genitourinary:  Negative for dysuria.  Neurological:  Negative for headaches.    Physical Exam Updated Vital Signs BP 132/69   Pulse 63   Temp 98 F (36.7 C) (Oral)   Resp 20   Ht '5\' 8"'$  (1.727 m)   Wt 117.9 kg   SpO2 95%   BMI 39.53 kg/m  Physical Exam Vitals and nursing note reviewed.  Constitutional:      General: She is not in acute distress.    Appearance: She is well-developed.  HENT:     Head: Normocephalic and atraumatic.     Mouth/Throat:     Mouth: Mucous membranes are moist.  Eyes:     Conjunctiva/sclera: Conjunctivae normal.  Cardiovascular:     Rate and Rhythm: Normal rate and regular rhythm.     Heart  sounds: No murmur heard. Pulmonary:     Effort: Pulmonary effort is normal. No respiratory distress.     Breath sounds: Normal breath sounds.  Abdominal:     Palpations: Abdomen is soft.     Tenderness: There is no abdominal tenderness.  Musculoskeletal:        General: No swelling or tenderness (No chest wall tenderness).     Cervical back: Neck supple.  Skin:    General: Skin is warm and dry.     Capillary Refill: Capillary refill takes less than 2 seconds.  Neurological:     Mental Status: She is alert.  Psychiatric:        Mood and Affect: Mood normal.      ED Results / Procedures / Treatments   Labs (all labs ordered are listed, but only abnormal results are displayed) Labs Reviewed - No data to display  EKG None  Radiology DG Chest 2 View  Result Date: 11/12/2021 CLINICAL DATA:  Cough EXAM: CHEST - 2 VIEW COMPARISON:  Radiograph August 11, 2020. FINDINGS: The heart size and mediastinal contours are within normal limits. Aortic atherosclerosis. No focal airspace consolidation. No pleural effusion. No pneumothorax. The visualized skeletal structures are unremarkable. IMPRESSION: No acute cardiopulmonary disease. Electronically Signed   By: Dahlia Bailiff M.D.   On: 11/12/2021 15:47    Procedures Procedures    Medications Ordered in ED Medications - No data to display  ED Course/ Medical Decision Making/ A&P                           Medical Decision Making Amount and/or Complexity of Data Reviewed Radiology: ordered.   This patient presents to the ED for concern of cough, this involves an extensive number of treatment options, and is a complaint that carries with it a high risk of complications and morbidity.  The differential diagnosis includes but is not limited to pneumonia, viral illness, Covid 19, influenza, and others   Co morbidities that complicate the patient evaluation  Obstructive sleep apnea   Additional history obtained:  Additional history obtained from husband at bedside   Lab Tests:  The patient has negative strep, Covid, influenza, and RSV testing from 1 week ago. I see no utility in repeat testing   Imaging Studies ordered:  I ordered imaging studies including plain chest x-ray  I independently visualized and interpreted imaging which showed no acute cardiopulmonary disease I agree with the radiologist interpretation    Test / Admission - Considered:  The patient has lung sounds are clear to auscultation bilaterally.  No pneumonia on chest x-ray.  This seems a viral illness.  Unclear as to  when his last at this morning but the patient states that she has autoimmune issues and that she frequently has emesis that last longer than other people do.  I see no indication at this time for hospital admission.  Her saturations on room air are 95+ percent.  Her vitals are normal.  She may discharge home at this time.  I will prescribe Tessalon Perles to help the patient with her cough.  I recommend she follow-up with her primary care provider as needed        Final Clinical Impression(s) / ED Diagnoses Final diagnoses:  Acute upper respiratory infection, unspecified  Acute cough    Rx / DC Orders ED Discharge Orders     None         Dorothyann Peng,  PA-C 11/12/21 1605    Dorothyann Peng, PA-C 11/12/21 1606    Drenda Freeze, MD 11/12/21 [phone]

## 2021-11-12 NOTE — ED Triage Notes (Signed)
Pt reports seen by PCP Friday. Neg for flu and covid. Pt has one day left of Z pack . Feeling worse since Saturday. Pain in ribs when coughing. Slight SOB with exertion

## 2021-12-07 DIAGNOSIS — I8312 Varicose veins of left lower extremity with inflammation: Secondary | ICD-10-CM | POA: Diagnosis not present

## 2021-12-17 ENCOUNTER — Telehealth: Payer: Self-pay | Admitting: Nurse Practitioner

## 2021-12-17 NOTE — Telephone Encounter (Signed)
Received a referral on pt from [facility] for history of thyroiditis. [facility] said labs are normal and pt does not need to see endocrinology. Closed referral/denied and send it to PCP to make them aware

## 2022-01-19 DIAGNOSIS — J45909 Unspecified asthma, uncomplicated: Secondary | ICD-10-CM | POA: Diagnosis not present

## 2022-01-19 DIAGNOSIS — Z20822 Contact with and (suspected) exposure to covid-19: Secondary | ICD-10-CM | POA: Diagnosis not present

## 2022-01-19 DIAGNOSIS — R059 Cough, unspecified: Secondary | ICD-10-CM | POA: Diagnosis not present

## 2022-01-19 DIAGNOSIS — J019 Acute sinusitis, unspecified: Secondary | ICD-10-CM | POA: Diagnosis not present

## 2022-01-26 DIAGNOSIS — J3089 Other allergic rhinitis: Secondary | ICD-10-CM | POA: Diagnosis not present

## 2022-01-26 DIAGNOSIS — R0609 Other forms of dyspnea: Secondary | ICD-10-CM | POA: Diagnosis not present

## 2022-01-26 DIAGNOSIS — Z6841 Body Mass Index (BMI) 40.0 and over, adult: Secondary | ICD-10-CM | POA: Diagnosis not present

## 2022-01-26 DIAGNOSIS — R051 Acute cough: Secondary | ICD-10-CM | POA: Diagnosis not present

## 2022-01-27 DIAGNOSIS — H40053 Ocular hypertension, bilateral: Secondary | ICD-10-CM | POA: Diagnosis not present

## 2022-01-28 DIAGNOSIS — G4733 Obstructive sleep apnea (adult) (pediatric): Secondary | ICD-10-CM | POA: Diagnosis not present

## 2022-01-28 DIAGNOSIS — H2000 Unspecified acute and subacute iridocyclitis: Secondary | ICD-10-CM | POA: Diagnosis not present

## 2022-01-28 DIAGNOSIS — H40031 Anatomical narrow angle, right eye: Secondary | ICD-10-CM | POA: Diagnosis not present

## 2022-01-31 DIAGNOSIS — R002 Palpitations: Secondary | ICD-10-CM | POA: Diagnosis not present

## 2022-01-31 DIAGNOSIS — I471 Supraventricular tachycardia, unspecified: Secondary | ICD-10-CM | POA: Diagnosis not present

## 2022-01-31 DIAGNOSIS — I10 Essential (primary) hypertension: Secondary | ICD-10-CM | POA: Diagnosis not present

## 2022-02-02 DIAGNOSIS — I2510 Atherosclerotic heart disease of native coronary artery without angina pectoris: Secondary | ICD-10-CM | POA: Diagnosis not present

## 2022-02-02 DIAGNOSIS — R222 Localized swelling, mass and lump, trunk: Secondary | ICD-10-CM | POA: Diagnosis not present

## 2022-02-02 DIAGNOSIS — K449 Diaphragmatic hernia without obstruction or gangrene: Secondary | ICD-10-CM | POA: Diagnosis not present

## 2022-02-02 DIAGNOSIS — I700 Atherosclerosis of aorta: Secondary | ICD-10-CM | POA: Diagnosis not present

## 2022-02-04 DIAGNOSIS — H401111 Primary open-angle glaucoma, right eye, mild stage: Secondary | ICD-10-CM | POA: Diagnosis not present

## 2022-02-04 DIAGNOSIS — H04121 Dry eye syndrome of right lacrimal gland: Secondary | ICD-10-CM | POA: Diagnosis not present

## 2022-02-04 DIAGNOSIS — H1820 Unspecified corneal edema: Secondary | ICD-10-CM | POA: Diagnosis not present

## 2022-02-11 DIAGNOSIS — H18591 Other hereditary corneal dystrophies, right eye: Secondary | ICD-10-CM | POA: Diagnosis not present

## 2022-02-11 DIAGNOSIS — H2013 Chronic iridocyclitis, bilateral: Secondary | ICD-10-CM | POA: Diagnosis not present

## 2022-02-11 DIAGNOSIS — H40051 Ocular hypertension, right eye: Secondary | ICD-10-CM | POA: Diagnosis not present

## 2022-02-11 DIAGNOSIS — H2000 Unspecified acute and subacute iridocyclitis: Secondary | ICD-10-CM | POA: Diagnosis not present

## 2022-02-15 LAB — LAB REPORT - SCANNED: EGFR: 64

## 2022-02-18 DIAGNOSIS — H40051 Ocular hypertension, right eye: Secondary | ICD-10-CM | POA: Diagnosis not present

## 2022-02-18 DIAGNOSIS — H2000 Unspecified acute and subacute iridocyclitis: Secondary | ICD-10-CM | POA: Diagnosis not present

## 2022-03-04 DIAGNOSIS — M7661 Achilles tendinitis, right leg: Secondary | ICD-10-CM | POA: Diagnosis not present

## 2022-03-04 DIAGNOSIS — M67471 Ganglion, right ankle and foot: Secondary | ICD-10-CM | POA: Diagnosis not present

## 2022-03-04 DIAGNOSIS — M79671 Pain in right foot: Secondary | ICD-10-CM | POA: Diagnosis not present

## 2022-03-04 DIAGNOSIS — H2000 Unspecified acute and subacute iridocyclitis: Secondary | ICD-10-CM | POA: Diagnosis not present

## 2022-03-04 DIAGNOSIS — M79672 Pain in left foot: Secondary | ICD-10-CM | POA: Diagnosis not present

## 2022-03-24 DIAGNOSIS — K080 Exfoliation of teeth due to systemic causes: Secondary | ICD-10-CM | POA: Diagnosis not present

## 2022-03-25 ENCOUNTER — Encounter: Payer: Self-pay | Admitting: Adult Health

## 2022-03-25 ENCOUNTER — Ambulatory Visit (INDEPENDENT_AMBULATORY_CARE_PROVIDER_SITE_OTHER): Payer: Medicare Other | Admitting: Adult Health

## 2022-03-25 VITALS — BP 130/70 | HR 74 | Temp 97.4°F | Ht 68.0 in | Wt 264.6 lb

## 2022-03-25 DIAGNOSIS — R7303 Prediabetes: Secondary | ICD-10-CM | POA: Diagnosis not present

## 2022-03-25 DIAGNOSIS — Z7689 Persons encountering health services in other specified circumstances: Secondary | ICD-10-CM | POA: Diagnosis not present

## 2022-03-25 DIAGNOSIS — Z113 Encounter for screening for infections with a predominantly sexual mode of transmission: Secondary | ICD-10-CM | POA: Diagnosis not present

## 2022-03-25 DIAGNOSIS — J309 Allergic rhinitis, unspecified: Secondary | ICD-10-CM

## 2022-03-25 DIAGNOSIS — I10 Essential (primary) hypertension: Secondary | ICD-10-CM

## 2022-03-25 DIAGNOSIS — E063 Autoimmune thyroiditis: Secondary | ICD-10-CM

## 2022-03-25 DIAGNOSIS — Z1211 Encounter for screening for malignant neoplasm of colon: Secondary | ICD-10-CM

## 2022-03-25 DIAGNOSIS — G4733 Obstructive sleep apnea (adult) (pediatric): Secondary | ICD-10-CM

## 2022-03-25 DIAGNOSIS — K219 Gastro-esophageal reflux disease without esophagitis: Secondary | ICD-10-CM | POA: Diagnosis not present

## 2022-03-25 NOTE — Progress Notes (Unsigned)
[facility] clinic  Provider:  Durenda Age DNP  Code Status:  Full Code  Goals of Care:     11/12/2021    2:48 PM  Advanced Directives  Does Patient Have a Medical Advance Directive? No     Chief Complaint  Patient presents with   New Patient (Initial Visit)    Patient presents today for a new patient appointment    HPI: Patient is a 67 y.o. female seen today to establish care with [facility]. She has a PMH of Hashimoto's disease, OSA and hypertension. She is a retired Corporate treasurer. She has 2 daughters and a son. She is a grandmother to 10 children. She lives with husband. She  lived in [place] wherein she had a history of fall. Right knee started hurting on 03/19/08 and follows up with [facility]. She drinks 1-2 glasses of alcohol 2X/week. She declines mammogram when offered.   Past Medical History:  Diagnosis Date   Cardiac anomaly    Hashimoto's disease    OSA (obstructive sleep apnea)     No past surgical history on file.  Allergies  Allergen Reactions   Tramadol Other (See Comments)    Hyperanxiety  Panic attacks    Aspirin     Edema    Betadine [Povidone Iodine] Hives   Codeine     Central nervous system    Cortisone     Unknown   Gluten Meal     unknown   Mushroom Extract Complex     Fungi infection    Onion     Reflux   Prednisone     unknown   Premarin [Conjugated Estrogens] Hives   Tine Test [Old Tuberculin] Swelling   Tomato     Reflux   Valium [Diazepam] Nausea Only    Stop breathing    Versed [Midazolam] Nausea And Vomiting    Out of body experience    Wound Dressing Adhesive Hives   Iodine Rash    Outpatient Encounter Medications as of 03/25/2022  Medication Sig   albuterol (VENTOLIN HFA) 108 (90 Base) MCG/ACT inhaler Inhale 1 puff into the lungs every 6 (six) hours as needed for wheezing or shortness of breath.   benzonatate (TESSALON) 100 MG capsule Take 1 capsule (100 mg total) by mouth every 8 (eight) hours.   famotidine (PEPCID) 20 MG tablet  Take 20 mg by mouth in the morning and at bedtime.   Fluticasone Furoate 50 MCG/ACT AEPB Place 1 spray into both nostrils daily.   latanoprost (XALATAN) 0.005 % ophthalmic solution Place 1 drop into both eyes at bedtime.   levocetirizine (XYZAL) 5 MG tablet Take 5 mg by mouth daily.   metoprolol succinate (TOPROL-XL) 25 MG 24 hr tablet Take 1 tablet (25 mg total) by mouth daily.   ondansetron (ZOFRAN-ODT) 4 MG disintegrating tablet 91m ODT q4 hours prn nausea/vomit   pantoprazole (PROTONIX) 40 MG tablet Take 40 mg by mouth every evening.   PRESCRIPTION MEDICATION Place 1 suppository vaginally at bedtime. Medication: Vitamin E 200 U/MI Vaginal suppositories   No facility-administered encounter medications on file as of 03/25/2022.    Review of Systems:  Review of Systems  Constitutional:  Negative for appetite change, chills, fatigue and fever.  HENT:  Negative for congestion, hearing loss, rhinorrhea and sore throat.   Eyes: Negative.   Respiratory:  Negative for cough, shortness of breath and wheezing.   Cardiovascular:  Positive for palpitations. Negative for chest pain and leg swelling.  Palpitations at night when no sleep   Gastrointestinal:  Negative for abdominal pain, constipation, diarrhea, nausea and vomiting.  Genitourinary:  Negative for dysuria.  Musculoskeletal:  Negative for arthralgias, back pain and myalgias.  Skin:  Negative for color change, rash and wound.  Neurological:  Negative for dizziness, weakness and headaches.  Psychiatric/Behavioral:  Negative for behavioral problems. The patient is not nervous/anxious.     Health Maintenance  Topic Date Due   COVID-19 Vaccine (1) Never done   Hepatitis C Screening  Never done   DTaP/Tdap/Td (1 - Tdap) Never done   COLONOSCOPY (Pts 45-57yr Insurance coverage will need to be confirmed)  Never done   MAMMOGRAM  Never done   Zoster Vaccines- Shingrix (1 of 2) Never done   Pneumonia Vaccine 69 Years old (1 of 1 -  PCV) Never done   DEXA SCAN  Never done   Medicare Annual Wellness (AWV)  05/05/2020   INFLUENZA VACCINE  Never done   HPV VACCINES  Aged Out    Physical Exam: Vitals:   03/25/22 1022  Weight: 264 lb 9.6 oz (120 kg)  Height: 5' 8"$  (1.727 m)   Body mass index is 40.23 kg/m. Physical Exam Constitutional:      Appearance: She is obese.     Comments: Morbidly obese  HENT:     Head: Normocephalic and atraumatic.     Nose: Nose normal.     Mouth/Throat:     Mouth: Mucous membranes are moist.  Eyes:     Conjunctiva/sclera: Conjunctivae normal.  Cardiovascular:     Rate and Rhythm: Normal rate and regular rhythm.  Pulmonary:     Effort: Pulmonary effort is normal.     Breath sounds: Normal breath sounds.  Abdominal:     General: Bowel sounds are normal.     Palpations: Abdomen is soft.  Musculoskeletal:        General: Normal range of motion.     Cervical back: Normal range of motion.     Comments: Bilateral antecubital areas tender.  Skin:    General: Skin is warm and dry.  Neurological:     General: No focal deficit present.     Mental Status: She is alert and oriented to person, place, and time.  Psychiatric:        Mood and Affect: Mood normal.        Behavior: Behavior normal.        Thought Content: Thought content normal.        Judgment: Judgment normal.     Labs reviewed: Basic Metabolic Panel: Recent Labs    10/17/21 2240  NA 142  K 3.9  CL 109  CO2 27  GLUCOSE 115*  BUN 13  CREATININE 0.81  CALCIUM 9.0   Liver Function Tests: Recent Labs    10/17/21 2240  AST 25  ALT 25  ALKPHOS 71  BILITOT 0.4  PROT 6.9  ALBUMIN 3.9   Recent Labs    10/17/21 2240  LIPASE 28   No results for input(s): "AMMONIA" in the last 8760 hours. CBC: Recent Labs    10/17/21 2240  WBC 6.9  NEUTROABS 3.7  HGB 12.9  HCT 39.2  MCV 86.7  PLT 251   Lipid Panel: No results for input(s): "CHOL", "HDL", "LDLCALC", "TRIG", "CHOLHDL", "LDLDIRECT" in the last  8760 hours. No results found for: "HGBA1C"  Procedures since last visit: No results found.  Assessment/Plan  1. Encounter to establish care -  established care  with [facility]  2. Primary hypertension -   continue Toprol XL and Losartan - CBC With Differential/Platelet - Complete Metabolic Panel with eGFR - Lipid panel  3. Prediabetes -  not on medications, last A1c 5.9 (12/15/20) - Hemoglobin A1C  4. Gastroesophageal reflux disease without esophagitis -  continue Pantoprazole  5. OSA (obstructive sleep apnea) -   uses CPAP at bedtime  6. Allergic rhinitis, unspecified seasonality, unspecified trigger -   takes Melatonin  7. Morbidly obese ([place]) Wt Readings from Last 3 Encounters:  03/25/22 264 lb 9.6 oz (120 kg)  11/12/21 260 lb (117.9 kg)  10/17/21 260 lb (117.9 kg)   Body mass index is 40.23 kg/m. -  walks 10-15 minutes daily -  encouraged to exercise at least 150 minutes/week of moderate exercise  8. Hashimoto's disease -  has appointment with rheumatology - TSH - T3 - T4, Free  9. Screening for STD (sexually transmitted disease) - Hep C Antibody - HIV antibody (with reflex)  10. Screen for colon cancer - Ambulatory referral to Gastroenterology    Labs/tests ordered:  - Hep C Antibody - HIV antibody (with reflex) - TSH - T3 - T4, Free - Hemoglobin A1C - CBC With Differential/Platelet - Complete Metabolic Panel with eGFR - Lipid panel   Next appt:  in 2 weeks

## 2022-03-25 NOTE — Patient Instructions (Signed)
Preventive Care 65 Years and Older, Female Preventive care refers to lifestyle choices and visits with your health care provider that can promote health and wellness. Preventive care visits are also called wellness exams. What can I expect for my preventive care visit? Counseling Your health care provider may ask you questions about your: Medical history, including: Past medical problems. Family medical history. Pregnancy and menstrual history. History of falls. Current health, including: Memory and ability to understand (cognition). Emotional well-being. Home life and relationship well-being. Sexual activity and sexual health. Lifestyle, including: Alcohol, nicotine or tobacco, and drug use. Access to firearms. Diet, exercise, and sleep habits. Work and work environment. Sunscreen use. Safety issues such as seatbelt and bike helmet use. Physical exam Your health care provider will check your: Height and weight. These may be used to calculate your BMI (body mass index). BMI is a measurement that tells if you are at a healthy weight. Waist circumference. This measures the distance around your waistline. This measurement also tells if you are at a healthy weight and may help predict your risk of certain diseases, such as type 2 diabetes and high blood pressure. Heart rate and blood pressure. Body temperature. Skin for abnormal spots. What immunizations do I need?  Vaccines are usually given at various ages, according to a schedule. Your health care provider will recommend vaccines for you based on your age, medical history, and lifestyle or other factors, such as travel or where you work. What tests do I need? Screening Your health care provider may recommend screening tests for certain conditions. This may include: Lipid and cholesterol levels. Hepatitis C test. Hepatitis B test. HIV (human immunodeficiency virus) test. STI (sexually transmitted infection) testing, if you are at  risk. Lung cancer screening. Colorectal cancer screening. Diabetes screening. This is done by checking your blood sugar (glucose) after you have not eaten for a while (fasting). Mammogram. Talk with your health care provider about how often you should have regular mammograms. BRCA-related cancer screening. This may be done if you have a family history of breast, ovarian, tubal, or peritoneal cancers. Bone density scan. This is done to screen for osteoporosis. Talk with your health care provider about your test results, treatment options, and if necessary, the need for more tests. Follow these instructions at home: Eating and drinking  Eat a diet that includes fresh fruits and vegetables, whole grains, lean protein, and low-fat dairy products. Limit your intake of foods with high amounts of sugar, saturated fats, and salt. Take vitamin and mineral supplements as recommended by your health care provider. Do not drink alcohol if your health care provider tells you not to drink. If you drink alcohol: Limit how much you have to 0-1 drink a day. Know how much alcohol is in your drink. In the [place] one drink equals one 12 oz bottle of beer (355 mL), one 5 oz glass of wine (148 mL), or one 1 oz glass of hard liquor (44 mL). Lifestyle Brush your teeth every morning and night with fluoride toothpaste. Floss one time each day. Exercise for at least 30 minutes 5 or more days each week. Do not use any products that contain nicotine or tobacco. These products include cigarettes, chewing tobacco, and vaping devices, such as e-cigarettes. If you need help quitting, ask your health care provider. Do not use drugs. If you are sexually active, practice safe sex. Use a condom or other form of protection in order to prevent STIs. Take aspirin only as told by   your health care provider. Make sure that you understand how much to take and what form to take. Work with your health care provider to find out whether it  is safe and beneficial for you to take aspirin daily. Ask your health care provider if you need to take a cholesterol-lowering medicine (statin). Find healthy ways to manage stress, such as: Meditation, yoga, or listening to music. Journaling. Talking to a trusted person. Spending time with friends and family. Minimize exposure to UV radiation to reduce your risk of skin cancer. Safety Always wear your seat belt while driving or riding in a vehicle. Do not drive: If you have been drinking alcohol. Do not ride with someone who has been drinking. When you are tired or distracted. While texting. If you have been using any mind-altering substances or drugs. Wear a helmet and other protective equipment during sports activities. If you have firearms in your house, make sure you follow all gun safety procedures. What's next? Visit your health care provider once a year for an annual wellness visit. Ask your health care provider how often you should have your eyes and teeth checked. Stay up to date on all vaccines. This information is not intended to replace advice given to you by your health care provider. Make sure you discuss any questions you have with your health care provider. Document Revised: 07/29/2020 Document Reviewed: 07/29/2020 Elsevier Patient Education  2023 Elsevier Inc.  

## 2022-03-26 LAB — COMPLETE METABOLIC PANEL WITH GFR
AG Ratio: 1.7 (calc) (ref 1.0–2.5)
ALT: 28 U/L (ref 6–29)
AST: 23 U/L (ref 10–35)
Albumin: 4.5 g/dL (ref 3.6–5.1)
Alkaline phosphatase (APISO): 82 U/L (ref 37–153)
BUN: 15 mg/dL (ref 7–25)
CO2: 25 mmol/L (ref 20–32)
Calcium: 9.7 mg/dL (ref 8.6–10.4)
Chloride: 106 mmol/L (ref 98–110)
Creat: 0.75 mg/dL (ref 0.50–1.05)
Globulin: 2.7 g/dL (calc) (ref 1.9–3.7)
Glucose, Bld: 111 mg/dL — ABNORMAL HIGH (ref 65–99)
Potassium: 4.3 mmol/L (ref 3.5–5.3)
Sodium: 142 mmol/L (ref 135–146)
Total Bilirubin: 0.4 mg/dL (ref 0.2–1.2)
Total Protein: 7.2 g/dL (ref 6.1–8.1)
eGFR: 87 mL/min/{1.73_m2} (ref >=60)

## 2022-03-26 LAB — HEMOGLOBIN A1C
Hgb A1c MFr Bld: 6.2 % of total Hgb — ABNORMAL HIGH (ref <5.7)
Mean Plasma Glucose: 131 mg/dL
eAG (mmol/L): 7.3 mmol/L

## 2022-03-26 LAB — CBC WITH DIFFERENTIAL/PLATELET
Absolute Monocytes: 462 cells/uL (ref 200–950)
Basophils Absolute: 48 cells/uL (ref 0–200)
Basophils Relative: 0.8 %
Eosinophils Absolute: 162 cells/uL (ref 15–500)
Eosinophils Relative: 2.7 %
HCT: 41 % (ref 35.0–45.0)
Hemoglobin: 13.7 g/dL (ref 11.7–15.5)
Lymphs Abs: 2424 cells/uL (ref 850–3900)
MCH: 29.2 pg (ref 27.0–33.0)
MCHC: 33.4 g/dL (ref 32.0–36.0)
MCV: 87.4 fL (ref 80.0–100.0)
MPV: 10.4 fL (ref 7.5–12.5)
Monocytes Relative: 7.7 %
Neutro Abs: 2904 cells/uL (ref 1500–7800)
Neutrophils Relative %: 48.4 %
Platelets: 267 10*3/uL (ref 140–400)
RBC: 4.69 10*6/uL (ref 3.80–5.10)
RDW: 12.9 % (ref 11.0–15.0)
Total Lymphocyte: 40.4 %
WBC: 6 10*3/uL (ref 3.8–10.8)

## 2022-03-26 LAB — LIPID PANEL
Cholesterol: 199 mg/dL (ref <200)
HDL: 55 mg/dL (ref >=50)
LDL Cholesterol (Calc): 114 mg/dL (calc) — ABNORMAL HIGH
Non-HDL Cholesterol (Calc): 144 mg/dL (calc) — ABNORMAL HIGH (ref <130)
Total CHOL/HDL Ratio: 3.6 (calc) (ref <5.0)
Triglycerides: 183 mg/dL — ABNORMAL HIGH (ref <150)

## 2022-03-26 LAB — TSH: TSH: 4.21 mIU/L (ref 0.40–4.50)

## 2022-03-26 LAB — T3: T3, Total: 114 ng/dL (ref 76–181)

## 2022-03-26 LAB — T4, FREE: Free T4: 0.9 ng/dL (ref 0.8–1.8)

## 2022-03-26 LAB — HIV ANTIBODY (ROUTINE TESTING W REFLEX): HIV 1&2 Ab, 4th Generation: NONREACTIVE

## 2022-03-26 LAB — HEPATITIS C ANTIBODY: Hepatitis C Ab: NONREACTIVE

## 2022-03-30 NOTE — Progress Notes (Signed)
-    hgbA1c 6.2, ranging as prediabetic -   triglycerides 183, elevated (normal <150) , restrict fats, avoid alcohol -   HIV and Hep C negative -  CBC and CMP all normal

## 2022-04-01 DIAGNOSIS — H40051 Ocular hypertension, right eye: Secondary | ICD-10-CM | POA: Diagnosis not present

## 2022-04-01 DIAGNOSIS — H2011 Chronic iridocyclitis, right eye: Secondary | ICD-10-CM | POA: Diagnosis not present

## 2022-04-01 DIAGNOSIS — H25813 Combined forms of age-related cataract, bilateral: Secondary | ICD-10-CM | POA: Diagnosis not present

## 2022-04-04 DIAGNOSIS — D1621 Benign neoplasm of long bones of right lower limb: Secondary | ICD-10-CM | POA: Diagnosis not present

## 2022-04-04 DIAGNOSIS — M67961 Unspecified disorder of synovium and tendon, right lower leg: Secondary | ICD-10-CM | POA: Diagnosis not present

## 2022-04-04 DIAGNOSIS — M2241 Chondromalacia patellae, right knee: Secondary | ICD-10-CM | POA: Diagnosis not present

## 2022-04-14 ENCOUNTER — Ambulatory Visit (INDEPENDENT_AMBULATORY_CARE_PROVIDER_SITE_OTHER): Payer: Medicare Other | Admitting: Adult Health

## 2022-04-14 ENCOUNTER — Encounter: Payer: Self-pay | Admitting: Adult Health

## 2022-04-14 VITALS — BP 127/88 | HR 61 | Temp 97.0°F | Resp 18 | Ht 68.0 in | Wt 267.0 lb

## 2022-04-14 DIAGNOSIS — R7303 Prediabetes: Secondary | ICD-10-CM | POA: Diagnosis not present

## 2022-04-14 DIAGNOSIS — K219 Gastro-esophageal reflux disease without esophagitis: Secondary | ICD-10-CM | POA: Diagnosis not present

## 2022-04-14 DIAGNOSIS — R002 Palpitations: Secondary | ICD-10-CM

## 2022-04-14 DIAGNOSIS — G4733 Obstructive sleep apnea (adult) (pediatric): Secondary | ICD-10-CM

## 2022-04-14 DIAGNOSIS — I10 Essential (primary) hypertension: Secondary | ICD-10-CM

## 2022-04-14 DIAGNOSIS — E063 Autoimmune thyroiditis: Secondary | ICD-10-CM

## 2022-04-14 DIAGNOSIS — E782 Mixed hyperlipidemia: Secondary | ICD-10-CM

## 2022-04-14 NOTE — Progress Notes (Signed)
[facility] clinic  Provider:  Durenda Age DNP  Code Status:  Full Code  Goals of Care:     11/12/2021    2:48 PM  Advanced Directives  Does Patient Have a Medical Advance Directive? No     Chief Complaint  Patient presents with   Follow-up    Two weeks follow up    HPI: Patient is a Tamara Morgan seen today for medical management of chronic issues.  Primary hypertension -  BP 127/88, takes Losartan PRN for SBP >113, and Metoprolol at night  Prediabetes -  eats salad, watches salad, has been prediabetic for 30 years  Gastroesophageal reflux disease without esophagitis - takes Pantoprazole, better, takes naturopathic supplements for the gut  OSA (obstructive sleep apnea) -  uses CPAP at night  Mixed hyperlipidemia  -  declines to take statin, eats salad  Might be moving to another PCP to get closer to her house.  Past Medical History:  Diagnosis Date   Arrhythmia    Benign tumor    Cardiac anomaly    GERD (gastroesophageal reflux disease)    Hashimoto's disease    History of bone density study    Hyperlipidemia    Hypertension    Lyme disease    Malaria    OSA (obstructive sleep apnea)    Pap smear, as part of routine gynecological examination 2022    Past Surgical History:  Procedure Laterality Date   CARDIAC CATHETERIZATION     COLONOSCOPY  2015   2010-2014   ECTOPIC PREGNANCY SURGERY     OOPHORECTOMY Left    RECTOCELE REPAIR     VAGINAL HYSTERECTOMY      Allergies  Allergen Reactions   Tramadol Other (See Comments)    Hyperanxiety  Panic attacks    Aspirin     Edema    Betadine [Povidone Iodine] Hives   Codeine     Central nervous system    Cortisone     Unknown   Gluten Meal     unknown   Mushroom Extract Complex     Fungi infection    Onion     Reflux   Prednisone     unknown   Premarin [Conjugated Estrogens] Hives   Tine Test [Old Tuberculin] Swelling   Tomato     Reflux   Valium [Diazepam] Nausea Only    Stop breathing     Versed [Midazolam] Nausea And Vomiting    Out of body experience    Wound Dressing Adhesive Hives   Iodine Rash    Outpatient Encounter Medications as of 04/14/2022  Medication Sig   Acetylcysteine (NAC PO) Take 1,200 mg by mouth daily.   Azelastine HCl 137 MCG/SPRAY SOLN Place 1-2 sprays into the nose in the morning and at bedtime.   benzonatate (TESSALON) 200 MG capsule Take 200 mg by mouth 3 (three) times daily as needed for cough.   Brinzolamide-Brimonidine (SIMBRINZA) 1-0.2 % SUSP Apply 1 drop to eye in the morning and at bedtime.   Calcium-Magnesium (CAL/MAG PO) Take by mouth.   cetirizine (ZYRTEC) 10 MG tablet Take 10 mg by mouth daily.   chlorhexidine (PERIDEX) 0.12 % solution Use as directed 15 mLs in the mouth or throat 2 (two) times daily.   Cholecalciferol (VITAMIN D-3 PO) Take by mouth.   clopidogrel (PLAVIX) 75 MG tablet Take 75 mg by mouth daily.   diphenhydrAMINE-PE-APAP (ALLERGY RELIEF PLUS SINUS PO) Take 50 mg by mouth at bedtime.   fluticasone (FLOVENT  HFA) 110 MCG/ACT inhaler Inhale 2 puffs into the lungs daily.   Ibuprofen-Acetaminophen (MOTRIN DUAL ACTION) 125-250 MG TABS Take 1 tablet by mouth as needed.   losartan (COZAAR) 25 MG tablet Take 25 mg by mouth in the morning and at bedtime.   Melatonin 10 MG TABS Take 20 mg by mouth at bedtime.   Misc Natural Products (TURMERIC CURCUMIN) CAPS Take 2,250 capsules by mouth daily.   Nutritional Supplements (COLD AND FLU PO) Take 2 tablets by mouth at bedtime.   pantoprazole (PROTONIX) 40 MG tablet Take 40 mg by mouth every evening.   Phenylephrine-Acetaminophen (TYLENOL SINUS CONGESTION/PAIN PO) Take by mouth as needed.   Povidone, PF, (IVIZIA DRY EYES) 0.5 % SOLN Apply 2 drops to eye in the morning and at bedtime.   prednisoLONE Acetate-Nepafenac 1-0.1 % SUSP Apply 1 drop to eye daily.   sodium chloride (MURO 128) 5 % ophthalmic ointment Place 1 Application into the right eye at bedtime.   metoprolol succinate  (TOPROL-XL) 25 MG 24 hr tablet Take 1 tablet (25 mg total) by mouth daily.   No facility-administered encounter medications on file as of 04/14/2022.    Review of Systems:  Review of Systems  Constitutional:  Negative for appetite change, chills, fatigue and fever.  HENT:  Negative for congestion, hearing loss, rhinorrhea and sore throat.   Eyes: Negative.   Respiratory:  Negative for cough, shortness of breath and wheezing.   Cardiovascular:  Positive for palpitations. Negative for chest pain and leg swelling.  Gastrointestinal:  Negative for abdominal pain, constipation, diarrhea, nausea and vomiting.  Genitourinary:  Negative for dysuria.  Musculoskeletal:  Negative for arthralgias, back pain and myalgias.  Skin:  Negative for color change, rash and wound.  Neurological:  Negative for dizziness, weakness and headaches.  Psychiatric/Behavioral:  Negative for behavioral problems. The patient is not nervous/anxious.     Health Maintenance  Topic Date Due   COVID-19 Vaccine (1) Never done   DTaP/Tdap/Td (1 - Tdap) Never done   COLONOSCOPY (Pts 45-83yr Insurance coverage will need to be confirmed)  Never done   MAMMOGRAM  Never done   Zoster Vaccines- Shingrix (1 of 2) Never done   Pneumonia Vaccine 69 Years old (1 of 1 - PCV) Never done   DEXA SCAN  Never done   Medicare Annual Wellness (AWV)  05/05/2020   INFLUENZA VACCINE  05/15/2022 (Originally 09/14/2021)   Hepatitis C Screening  Completed   HPV VACCINES  Aged Out    Physical Exam: Vitals:   04/14/22 1422  BP: 127/88  Pulse: 61  Resp: 18  Temp: (!) 97 F (36.1 C)  SpO2: 97%  Weight: 267 lb (121.1 kg)  Height: '5\' 8"'$  (1.727 m)   Body mass index is 40.6 kg/m. Physical Exam Constitutional:      General: She is not in acute distress.    Appearance: She is obese.     Comments: Morbidly obese  HENT:     Head: Normocephalic and atraumatic.     Nose: Nose normal.     Mouth/Throat:     Mouth: Mucous membranes are  moist.  Eyes:     Conjunctiva/sclera: Conjunctivae normal.  Cardiovascular:     Rate and Rhythm: Normal rate and regular rhythm.  Pulmonary:     Effort: Pulmonary effort is normal.     Breath sounds: Normal breath sounds.  Abdominal:     General: Bowel sounds are normal.     Palpations: Abdomen is soft.  Musculoskeletal:  General: Normal range of motion.     Cervical back: Normal range of motion.  Skin:    General: Skin is warm and dry.  Neurological:     General: No focal deficit present.     Mental Status: She is alert and oriented to person, place, and time.  Psychiatric:        Mood and Affect: Mood normal.        Behavior: Behavior normal.        Thought Content: Thought content normal.        Judgment: Judgment normal.     Labs reviewed: Basic Metabolic Panel: Recent Labs    10/17/21 2240 03/25/22 1152  NA 142 142  K 3.9 4.3  CL 109 106  CO2 27 25  GLUCOSE 115* 111*  BUN 13 15  CREATININE 0.81 0.75  CALCIUM 9.0 9.7  TSH  --  4.21   Liver Function Tests: Recent Labs    10/17/21 2240 03/25/22 1152  AST 25 23  ALT 25 28  ALKPHOS 71  --   BILITOT 0.4 0.4  PROT 6.9 7.2  ALBUMIN 3.9  --    Recent Labs    10/17/21 2240  LIPASE 28   No results for input(s): "AMMONIA" in the last 8760 hours. CBC: Recent Labs    10/17/21 2240 03/25/22 1152  WBC 6.9 6.0  NEUTROABS 3.7 2,904  HGB 12.9 13.7  HCT 39.2 41.0  MCV 86.7 87.4  PLT 251 267   Lipid Panel: Recent Labs    03/25/22 1152  CHOL 199  HDL 55  LDLCALC 114*  TRIG 183*  CHOLHDL 3.6   Lab Results  Component Value Date   HGBA1C 6.2 (H) 03/25/2022    Procedures since last visit: No results found.  Assessment/Plan  1. Primary hypertension -  BPs stable -  continue Losartan  2. Prediabetes Lab Results  Component Value Date   HGBA1C 6.2 (H) 03/25/2022   -  diet-controlled  3. Gastroesophageal reflux disease without esophagitis -   stable, continue Pantoprazole  4. OSA  (obstructive sleep apnea) -  uses CPAP at at night  5. Hashimoto's disease -  has an appointment with rheumatology on 06/17/22  6. Mixed hyperlipidemia Lab Results  Component Value Date   CHOL 199 03/25/2022   HDL 55 03/25/2022   LDLCALC 114 (H) 03/25/2022   TRIG 183 (H) 03/25/2022   CHOLHDL 3.6 03/25/2022   -  declines statin  7. Palpitations Lab Results  Component Value Date   TSH 4.21 03/25/2022   -   continue Plavix   Labs/tests ordered:  None  Next appt:  Visit date not found

## 2022-04-18 DIAGNOSIS — M899 Disorder of bone, unspecified: Secondary | ICD-10-CM | POA: Diagnosis not present

## 2022-04-18 DIAGNOSIS — Z1330 Encounter for screening examination for mental health and behavioral disorders, unspecified: Secondary | ICD-10-CM | POA: Diagnosis not present

## 2022-04-18 DIAGNOSIS — I10 Essential (primary) hypertension: Secondary | ICD-10-CM | POA: Diagnosis not present

## 2022-04-18 DIAGNOSIS — D169 Benign neoplasm of bone and articular cartilage, unspecified: Secondary | ICD-10-CM | POA: Diagnosis not present

## 2022-04-29 DIAGNOSIS — H2011 Chronic iridocyclitis, right eye: Secondary | ICD-10-CM | POA: Diagnosis not present

## 2022-04-29 DIAGNOSIS — H1820 Unspecified corneal edema: Secondary | ICD-10-CM | POA: Diagnosis not present

## 2022-05-13 DIAGNOSIS — M67471 Ganglion, right ankle and foot: Secondary | ICD-10-CM | POA: Diagnosis not present

## 2022-05-13 DIAGNOSIS — M79671 Pain in right foot: Secondary | ICD-10-CM | POA: Diagnosis not present

## 2022-05-13 DIAGNOSIS — M79672 Pain in left foot: Secondary | ICD-10-CM | POA: Diagnosis not present

## 2022-05-24 DIAGNOSIS — M1711 Unilateral primary osteoarthritis, right knee: Secondary | ICD-10-CM | POA: Diagnosis not present

## 2022-05-26 DIAGNOSIS — K219 Gastro-esophageal reflux disease without esophagitis: Secondary | ICD-10-CM | POA: Diagnosis not present

## 2022-05-26 DIAGNOSIS — E782 Mixed hyperlipidemia: Secondary | ICD-10-CM | POA: Diagnosis not present

## 2022-05-26 DIAGNOSIS — G4733 Obstructive sleep apnea (adult) (pediatric): Secondary | ICD-10-CM | POA: Diagnosis not present

## 2022-05-26 DIAGNOSIS — I10 Essential (primary) hypertension: Secondary | ICD-10-CM | POA: Diagnosis not present

## 2022-05-30 DIAGNOSIS — H2011 Chronic iridocyclitis, right eye: Secondary | ICD-10-CM | POA: Diagnosis not present

## 2022-05-30 DIAGNOSIS — H2513 Age-related nuclear cataract, bilateral: Secondary | ICD-10-CM | POA: Diagnosis not present

## 2022-06-03 NOTE — Progress Notes (Signed)
Office Visit Note  Patient: Tamara Morgan             Date of Birth: [birth year]           MRN: [id number]             PCP: Samuella Bruin Referring: Elise Benne, MD Visit Date: 06/17/2022 Occupation: @GUAROCC @  Subjective:  No chief complaint on file.   History of Present Illness: Tamara Morgan is a 67 y.o. female seen in consultation per request of Dr. Emily Filbert for positive ANA.  Patient states in the fall 2023 she developed right eye visual changes.  She was seen by Dr. Emily Filbert and was diagnosed with increased intraocular pressure.  She states she had extensive workup and follow-up and was finally diagnosed with reticulitis.  She has been on eyedrops.  Her vision is better on the eyedrops.  Her labs showed positive ANA for that reason she was referred to me for the evaluation.  She denies any history of oral ulcers, nasal ulcers, malar rash, photosensitivity, Raynaud's or lymphadenopathy.  She gives history of discomfort in her hands and feet for many years.  She states she was also evaluated at [facility] and was diagnosed with osteoarthritis.  She notices intermittent swelling in her hands and her ankles.  She states she had right knee injury in 2007 and has chronic discomfort in her knee since then.  There is no history of psoriasis, Achilles tendinitis, Planter fasciitis.  There is no family history of psoriasis.  There is no family history of autoimmune disease.  She is gravida 6, para 3, miscarriages 3.  Patient states she had preeclampsia and gestational diabetes during pregnancies.    Activities of Daily Living:  Patient reports morning stiffness for 0 minutes.   Patient Reports nocturnal pain.  Difficulty dressing/grooming: Denies Difficulty climbing stairs: Reports Difficulty getting out of chair: Reports Difficulty using hands for taps, buttons, cutlery, and/or writing: Reports  Review of Systems  Constitutional:  Positive for fatigue.  HENT:  Positive for mouth  dryness. Negative for mouth sores.   Eyes:  Positive for pain, visual disturbance and dryness.  Respiratory:  Negative for difficulty breathing.   Cardiovascular:  Positive for palpitations.  Gastrointestinal:  Positive for heartburn. Negative for blood in stool, constipation and diarrhea.  Endocrine: Negative for increased urination.  Genitourinary:  Negative for involuntary urination.  Musculoskeletal:  Positive for joint pain, joint pain, joint swelling, myalgias, muscle tenderness and myalgias. Negative for gait problem, muscle weakness and morning stiffness.  Skin:  Positive for hair loss. Negative for color change, rash and sensitivity to sunlight.  Allergic/Immunologic: Positive for susceptible to infections.  Neurological:  Positive for headaches. Negative for dizziness.  Hematological:  Negative for swollen glands.  Psychiatric/Behavioral:  Negative for depressed mood and sleep disturbance. The patient is not nervous/anxious.     PMFS History:  Patient Active Problem List   Diagnosis Date Noted   OSA (obstructive sleep apnea) 06/17/2022   Hashimoto's thyroiditis 06/17/2022   History of gastroesophageal reflux (GERD) 06/17/2022   Varicose veins of bilateral lower extremities with other complications 06/17/2022   Supraventricular tachycardia 06/17/2022   Primary hypertension 06/17/2022    Past Medical History:  Diagnosis Date   Arrhythmia    Benign tumor    Cardiac anomaly    GERD (gastroesophageal reflux disease)    Hashimoto's disease    History of bone density study    Hyperlipidemia    Hypertension    Lyme disease  Malaria    OSA (obstructive sleep apnea)    Pap smear, as part of routine gynecological examination 2022    Family History  Problem Relation Age of Onset   Hypertension Mother    Hypothyroidism Mother    Heart Problems Mother        Heart stent   Dementia Mother 45   Arthritis Mother    Arthritis Father    Cancer Father        TTR    Hypertension Father    Diabetes Father        type 2   Hypertension Daughter    Appendicitis Daughter    Hypertension Son    Past Surgical History:  Procedure Laterality Date   CARDIAC CATHETERIZATION     COLONOSCOPY     several   ECTOPIC PREGNANCY SURGERY     OOPHORECTOMY Left    RECTOCELE REPAIR     VAGINAL HYSTERECTOMY     Social History   Social History Narrative   Not on file    There is no immunization history on file for this patient.   Objective: Vital Signs: BP (!) 140/78 (BP Location: Right Arm, Patient Position: Sitting, Cuff Size: Large)   Pulse 71   Resp 17   Ht 5\' 8"  (1.727 m)   Wt 265 lb (120.2 kg)   BMI 40.29 kg/m    Physical Exam Vitals and nursing note reviewed.  Constitutional:      Appearance: She is well-developed.  HENT:     Head: Normocephalic and atraumatic.  Eyes:     Conjunctiva/sclera: Conjunctivae normal.  Cardiovascular:     Rate and Rhythm: Normal rate and regular rhythm.     Heart sounds: Normal heart sounds.  Pulmonary:     Effort: Pulmonary effort is normal.     Breath sounds: Normal breath sounds.  Abdominal:     General: Bowel sounds are normal.     Palpations: Abdomen is soft.  Musculoskeletal:     Cervical back: Normal range of motion.  Lymphadenopathy:     Cervical: No cervical adenopathy.  Skin:    General: Skin is warm and dry.     Capillary Refill: Capillary refill takes less than 2 seconds.  Neurological:     Mental Status: She is alert and oriented to person, place, and time.  Psychiatric:        Behavior: Behavior normal.      Musculoskeletal Exam: Cervical, thoracic and lumbar spine were in good range of motion.  She had some discomfort range of motion of her lumbar spine.  Shoulder joints, elbow joints, wrist joints, MCPs PIPs and DIPs with good range of motion.  She had bilateral PIP and DIP thickening consistent with osteoarthritis with no synovitis.  Hip joints and knee joints with good range of motion  with some discomfort in her right knee joint without any warmth swelling or effusion.  There was no tenderness over ankles or MTPs.  Prominence of PIP and DIP joints was noted.  No plantar fasciitis or Achilles tendinitis was noted.  CDAI Exam: CDAI Score: -- Patient Global: --; Provider Global: -- Swollen: --; Tender: -- Joint Exam 06/17/2022   No joint exam has been documented for this visit   There is currently no information documented on the homunculus. Go to the Rheumatology activity and complete the homunculus joint exam.  Investigation: No additional findings.  Imaging: No results found.  Recent Labs: Lab Results  Component Value Date   WBC  6.0 03/25/2022   HGB 13.7 03/25/2022   PLT 267 03/25/2022   NA 142 03/25/2022   K 4.3 03/25/2022   CL 106 03/25/2022   CO2 25 03/25/2022   GLUCOSE 111 (H) 03/25/2022   BUN 15 03/25/2022   CREATININE 0.75 03/25/2022   BILITOT 0.4 03/25/2022   ALKPHOS 71 10/17/2021   AST 23 03/25/2022   ALT 28 03/25/2022   PROT 7.2 03/25/2022   ALBUMIN 3.9 10/17/2021   CALCIUM 9.7 03/25/2022   February 11, 2022 BMP normal creatinine 0.96, ANA 1: 40 cytoplasmic speckled and nuclear homogenous, HSV IgG negative, HLA-B27 negative, sed rate 6, CBC WBC 6.8, hemoglobin 13.4, platelets 252,  rheumatoid factor negative, C-reactive protein 3.3, varicella zoster antibody IgG positive, ACE 28  December 15, 2020 antithyroglobulin antibody positive March 25, 2022 hepatitis C negative, HIV negative  Speciality Comments: No specialty comments available.  Procedures:  No procedures performed Allergies: Tramadol, Aspirin, Betadine [povidone iodine], Codeine, Cortisone, Gluten meal, Mushroom extract complex, Onion, Prednisone, Premarin [conjugated estrogens], Tine test [old tuberculin], Tomato, Valium [diazepam], Versed [midazolam], Wound dressing adhesive, and Iodine   Assessment / Plan:     Visit Diagnoses: Positive ANA (antinuclear antibody) -patient was  found to have positive ANA 1: 40NH during the workup by ophthalmologist.  Patient denies any history of oral ulcers, nasal ulcers, malar rash, photosensitivity, Raynaud's, lymphadenopathy or oral ulcers.  She gives history of sicca symptoms, hair loss and fatigue.  Her exam was unremarkable.  I will obtain following labs today.  Plan: Protein / creatinine ratio, urine, Anti-scleroderma antibody, RNP Antibody, Anti-Smith antibody, Sjogrens syndrome-A extractable nuclear antibody, Sjogrens syndrome-B extractable nuclear antibody, Anti-DNA antibody, double-stranded, C3 and C4  Pain of right eye - Evaluated by Dr. Emily Filbert and diagnosed with Trabeculitis. -Patient states her eye symptoms have resolved on the eyedrops.  She had extensive labs by Dr. Emily Filbert which were unremarkable.  Will obtain additional labs today.  Plan: QuantiFERON-TB Gold Plus, Protein electrophoresis, serum, Toxoplasma gondii antibody, IgM, Fluorescent treponemal ab(fta)-IgG-bld, Epstein-Barr virus VCA antibody panel, CMV abs, IgG+IgM (cytomegalovirus), Hepatitis B core antibody, IgM  Pain in both hands-she gives history of pain in her hands for many years.  She reports intermittent swelling.  No synovitis was noted.  Bilateral PIP and DIP thickening consistent with osteoarthritis was noted.  Joint protection muscle strengthening was discussed.  Chronic pain of the right knee-patient states she had an injury to her right knee joint in 2017.  Since then she has had chronic pain and discomfort in the right knee joint.  She is seeing orthopedic surgeon.  Pain in both feet-she gives history of pain in her feet for many years.  Osteoarthritic changes were noted.  Patient states she was evaluated at [facility].  History of DVT-patient states of DVTs.  She states that first was postpartum and second was related to travels.  Primary hypertension-blood pressure was elevated at 140/78.  She was advised to monitor blood pressure closely and follow-up  with the PCP.  Palpitations  Supraventricular tachycardia-followed by cardiology per patient.  Status post ablation.  She is on Plavix.  Chronic anticoagulation - on Plavix since 2023.  Varicose veins of bilateral lower extremities with other complications  History of gastroesophageal reflux (GERD)  Hashimoto's thyroiditis-antithyroglobulin antibody was positive.  OSA (obstructive sleep apnea)  Orders: Orders Placed This Encounter  Procedures   Protein / creatinine ratio, urine   Anti-scleroderma antibody   RNP Antibody   Anti-Smith antibody   Sjogrens syndrome-A extractable nuclear antibody  Sjogrens syndrome-B extractable nuclear antibody   Anti-DNA antibody, double-stranded   C3 and C4   QuantiFERON-TB Gold Plus   Protein electrophoresis, serum   Toxoplasma gondii antibody, IgM   Fluorescent treponemal ab(fta)-IgG-bld   Epstein-Barr virus VCA antibody panel   CMV abs, IgG+IgM (cytomegalovirus)   Hepatitis B core antibody, IgM   No orders of the defined types were placed in this encounter.   Follow-Up Instructions: No follow-ups on file.   Pollyann Savoy, MD  Note - This record has been created using Animal nutritionist.  Chart creation errors have been sought, but may not always  have been located. Such creation errors do not reflect on  the standard of medical care.

## 2022-06-15 DIAGNOSIS — D485 Neoplasm of uncertain behavior of skin: Secondary | ICD-10-CM | POA: Diagnosis not present

## 2022-06-15 DIAGNOSIS — L820 Inflamed seborrheic keratosis: Secondary | ICD-10-CM | POA: Diagnosis not present

## 2022-06-17 ENCOUNTER — Encounter: Payer: Self-pay | Admitting: Rheumatology

## 2022-06-17 ENCOUNTER — Ambulatory Visit: Payer: Medicare Other | Attending: Rheumatology | Admitting: Rheumatology

## 2022-06-17 VITALS — BP 140/78 | HR 71 | Resp 17 | Ht 68.0 in | Wt 265.0 lb

## 2022-06-17 DIAGNOSIS — G8929 Other chronic pain: Secondary | ICD-10-CM

## 2022-06-17 DIAGNOSIS — M25561 Pain in right knee: Secondary | ICD-10-CM

## 2022-06-17 DIAGNOSIS — I10 Essential (primary) hypertension: Secondary | ICD-10-CM

## 2022-06-17 DIAGNOSIS — Z86718 Personal history of other venous thrombosis and embolism: Secondary | ICD-10-CM

## 2022-06-17 DIAGNOSIS — H5711 Ocular pain, right eye: Secondary | ICD-10-CM

## 2022-06-17 DIAGNOSIS — Z8719 Personal history of other diseases of the digestive system: Secondary | ICD-10-CM

## 2022-06-17 DIAGNOSIS — R768 Other specified abnormal immunological findings in serum: Secondary | ICD-10-CM

## 2022-06-17 DIAGNOSIS — M79671 Pain in right foot: Secondary | ICD-10-CM

## 2022-06-17 DIAGNOSIS — G4733 Obstructive sleep apnea (adult) (pediatric): Secondary | ICD-10-CM

## 2022-06-17 DIAGNOSIS — Z7901 Long term (current) use of anticoagulants: Secondary | ICD-10-CM

## 2022-06-17 DIAGNOSIS — M79672 Pain in left foot: Secondary | ICD-10-CM

## 2022-06-17 DIAGNOSIS — I471 Supraventricular tachycardia, unspecified: Secondary | ICD-10-CM

## 2022-06-17 DIAGNOSIS — R002 Palpitations: Secondary | ICD-10-CM

## 2022-06-17 DIAGNOSIS — M79641 Pain in right hand: Secondary | ICD-10-CM

## 2022-06-17 DIAGNOSIS — E063 Autoimmune thyroiditis: Secondary | ICD-10-CM | POA: Insufficient documentation

## 2022-06-17 DIAGNOSIS — I83893 Varicose veins of bilateral lower extremities with other complications: Secondary | ICD-10-CM | POA: Insufficient documentation

## 2022-06-17 DIAGNOSIS — H5710 Ocular pain, unspecified eye: Secondary | ICD-10-CM

## 2022-06-17 DIAGNOSIS — M79642 Pain in left hand: Secondary | ICD-10-CM

## 2022-06-18 LAB — PROTEIN / CREATININE RATIO, URINE
Creatinine, Urine: 138 mg/dL (ref 20–275)
Protein/Creat Ratio: 65 mg/g creat (ref 24–184)
Protein/Creatinine Ratio: 0.065 mg/mg creat (ref 0.024–0.184)
Total Protein, Urine: 9 mg/dL (ref 5–24)

## 2022-06-18 LAB — ANTI-DNA ANTIBODY, DOUBLE-STRANDED: ds DNA Ab: <1 IU/mL

## 2022-06-18 LAB — SJOGRENS SYNDROME-B EXTRACTABLE NUCLEAR ANTIBODY: SSB (La) (ENA) Antibody, IgG: <1 AI

## 2022-06-18 LAB — C3 AND C4
C3 Complement: 174 mg/dL (ref 83–193)
C4 Complement: 26 mg/dL (ref 15–57)

## 2022-06-18 LAB — RNP ANTIBODY: Ribonucleic Protein(ENA) Antibody, IgG: <1 AI

## 2022-06-18 LAB — SJOGRENS SYNDROME-A EXTRACTABLE NUCLEAR ANTIBODY: SSA (Ro) (ENA) Antibody, IgG: <1 AI

## 2022-06-18 LAB — ANTI-SMITH ANTIBODY: ENA SM Ab Ser-aCnc: <1 AI

## 2022-06-18 LAB — ANTI-SCLERODERMA ANTIBODY: Scleroderma (Scl-70) (ENA) Antibody, IgG: <1 AI

## 2022-06-27 ENCOUNTER — Telehealth: Payer: Self-pay

## 2022-06-27 NOTE — Telephone Encounter (Signed)
Patient called requesting refill on pantoprazole. Patient states that she is in the process of finding a new PCP. She states that she has a PCP at [facility] physicians, but doesn't have an appointment with them yet. Is it ok to refill medication?  Message sent to Kenard Gower, NP

## 2022-06-28 ENCOUNTER — Other Ambulatory Visit: Payer: Self-pay | Admitting: Adult Health

## 2022-06-28 MED ORDER — PANTOPRAZOLE SODIUM 40 MG PO TBEC: 40.0000 mg | DELAYED_RELEASE_TABLET | Freq: Every evening | ORAL | 0 refills | Status: AC

## 2022-06-28 NOTE — Telephone Encounter (Signed)
Sent Pantoprazole prescription to pharmacy.

## 2022-06-29 DIAGNOSIS — K219 Gastro-esophageal reflux disease without esophagitis: Secondary | ICD-10-CM | POA: Diagnosis not present

## 2022-07-05 ENCOUNTER — Other Ambulatory Visit: Payer: Self-pay

## 2022-07-05 DIAGNOSIS — R7689 Other specified abnormal immunological findings in serum: Secondary | ICD-10-CM

## 2022-07-05 DIAGNOSIS — R768 Other specified abnormal immunological findings in serum: Secondary | ICD-10-CM

## 2022-07-05 DIAGNOSIS — H5711 Ocular pain, right eye: Secondary | ICD-10-CM

## 2022-07-05 NOTE — Progress Notes (Signed)
Office Visit Note  Patient: Tamara Morgan             Date of Birth: 05-09-1955           MRN: [id number]             PCP: Jackelyn Poling, DO Referring: Samuella Bruin Visit Date: 07/15/2022 Occupation: @GUAROCC @  Subjective:  History of high inflammation  History of Present Illness: Tamara Morgan is a 67 y.o. female returns today for evaluation of positive ANA and inflammation.  She states she continues to have some stiffness in her joints.  She has not seen any joint swelling.  She still uses prednisolone eyedrops as recommended by Dr. Emily Filbert.  She gives history of dry mouth and dry eyes.  There is no history of oral ulcers, nasal ulcers, malar rash, photosensitivity, Raynaud's phenomenon or lymphadenopathy.    Activities of Daily Living:  Patient reports morning stiffness for 1 hour.   Patient Denies nocturnal pain.  Difficulty dressing/grooming: Denies Difficulty climbing stairs: Reports Difficulty getting out of chair: Denies Difficulty using hands for taps, buttons, cutlery, and/or writing: Reports  Review of Systems  Constitutional:  Positive for fatigue.  HENT:  Positive for mouth dryness. Negative for mouth sores.   Eyes:  Positive for dryness.  Respiratory:  Negative for shortness of breath.   Cardiovascular:  Negative for chest pain and palpitations.  Gastrointestinal:  Negative for blood in stool, constipation and diarrhea.  Endocrine: Negative for increased urination.  Genitourinary:  Negative for involuntary urination.  Musculoskeletal:  Positive for joint pain, gait problem, joint pain, myalgias, muscle weakness, morning stiffness, muscle tenderness and myalgias. Negative for joint swelling.  Skin:  Positive for hair loss. Negative for color change, rash and sensitivity to sunlight.  Allergic/Immunologic: Negative for susceptible to infections.  Neurological:  Negative for dizziness and headaches.  Hematological:  Positive for bruising/bleeding  tendency. Negative for swollen glands.  Psychiatric/Behavioral:  Negative for depressed mood and sleep disturbance. The patient is not nervous/anxious.     PMFS History:  Patient Active Problem List   Diagnosis Date Noted   OSA (obstructive sleep apnea) 06/17/2022   Hashimoto's thyroiditis 06/17/2022   History of gastroesophageal reflux (GERD) 06/17/2022   Varicose veins of bilateral lower extremities with other complications 06/17/2022   Supraventricular tachycardia 06/17/2022   Primary hypertension 06/17/2022    Past Medical History:  Diagnosis Date   Arrhythmia    Benign tumor    Cardiac anomaly    GERD (gastroesophageal reflux disease)    Hashimoto's disease    History of bone density study    Hyperlipidemia    Hypertension    Lyme disease    Malaria    OSA (obstructive sleep apnea)    Pap smear, as part of routine gynecological examination 2022    Family History  Problem Relation Age of Onset   Hypertension Mother    Hypothyroidism Mother    Heart Problems Mother        Heart stent   Dementia Mother 72   Arthritis Mother    Arthritis Father    Cancer Father        TTR   Hypertension Father    Diabetes Father        type 2   Hypertension Daughter    Appendicitis Daughter    Hypertension Son    Past Surgical History:  Procedure Laterality Date   CARDIAC CATHETERIZATION     COLONOSCOPY     several  ECTOPIC PREGNANCY SURGERY     OOPHORECTOMY Left    RECTOCELE REPAIR     VAGINAL HYSTERECTOMY     Social History   Social History Narrative   Not on file    There is no immunization history on file for this patient.   Objective: Vital Signs: BP 125/79 (BP Location: Right Arm, Patient Position: Sitting, Cuff Size: Large)   Pulse (!) 53   Resp 17   Ht 5\' 8"  (1.727 m)   Wt 266 lb (120.7 kg)   BMI 40.45 kg/m    Physical Exam Vitals and nursing note reviewed.  Constitutional:      Appearance: She is well-developed.  HENT:     Head: Normocephalic  and atraumatic.  Eyes:     Conjunctiva/sclera: Conjunctivae normal.  Cardiovascular:     Rate and Rhythm: Normal rate and regular rhythm.     Heart sounds: Normal heart sounds.  Pulmonary:     Effort: Pulmonary effort is normal.     Breath sounds: Normal breath sounds.  Abdominal:     General: Bowel sounds are normal.     Palpations: Abdomen is soft.  Musculoskeletal:     Cervical back: Normal range of motion.  Lymphadenopathy:     Cervical: No cervical adenopathy.  Skin:    General: Skin is warm and dry.     Capillary Refill: Capillary refill takes less than 2 seconds.  Neurological:     Mental Status: She is alert and oriented to person, place, and time.  Psychiatric:        Behavior: Behavior normal.      Musculoskeletal Exam: Cervical spine was in good range of motion.  Shoulder joints, elbow joints, wrist joints were in good range of motion.  She had bilateral PIP and DIP thickening consistent with osteoarthritis.  Subluxation of some of the DIP joints was noted.  Hip joints and knee joints were in good range of motion.  No warmth swelling or effusion was noted.  She had no tenderness over ankles or MTPs.  CDAI Exam: CDAI Score: -- Patient Global: --; Provider Global: -- Swollen: --; Tender: -- Joint Exam 07/15/2022   No joint exam has been documented for this visit   There is currently no information documented on the homunculus. Go to the Rheumatology activity and complete the homunculus joint exam.  Investigation: No additional findings.  Imaging: No results found.  Recent Labs: Lab Results  Component Value Date   WBC 6.0 03/25/2022   HGB 13.7 03/25/2022   PLT 267 03/25/2022   NA 142 03/25/2022   K 4.3 03/25/2022   CL 106 03/25/2022   CO2 25 03/25/2022   GLUCOSE 111 (H) 03/25/2022   BUN 15 03/25/2022   CREATININE 0.75 03/25/2022   BILITOT 0.4 03/25/2022   ALKPHOS 71 10/17/2021   AST 23 03/25/2022   ALT 28 03/25/2022   PROT 7.0 07/06/2022   ALBUMIN  3.9 10/17/2021   CALCIUM 9.7 03/25/2022   QFTBGOLDPLUS NEGATIVE 07/06/2022   Jun 17, 2022 protein creatinine ratio normal, complements normal, ENA (SCL 70, RNP, Smith, SSA, SSB, dsDNA) negative  Jul 06, 2022 SPEP showed bisalbuminemia, TB Gold negative, EBV negative, CMV negative, toxoplasmosis negative, hepatitis B nonreactive  February 11, 2022 BMP normal creatinine 0.96, ANA 1: 40 cytoplasmic speckled and nuclear homogenous, HSV IgG negative, HLA-B27 negative, sed rate 6, CBC WBC 6.8, hemoglobin 13.4, platelets 252,  rheumatoid factor negative, C-reactive protein 3.3, varicella zoster antibody IgG positive, ACE 28  December 15, 2020 antithyroglobulin antibody positive March 25, 2022 hepatitis C negative, HIV negative   Speciality Comments: No specialty comments available.  Procedures:  No procedures performed Allergies: Tramadol, Aspirin, Betadine [povidone iodine], Codeine, Cortisone, Gluten meal, Mushroom extract complex, Onion, Prednisone, Premarin [conjugated estrogens], Tine test [old tuberculin], Tomato, Valium [diazepam], Versed [midazolam], Wound dressing adhesive, and Iodine   Assessment / Plan:     Visit Diagnoses: Positive ANA (antinuclear antibody) - ANA is low titer positive and not significant.  ENA negative.  I did detailed discussion with the patient regarding the labs.  She denies any history of oral ulcers, nasal ulcers, malar rash, photosensitivity, Raynaud's, lymphadenopathy.  She gives history of dry mouth and dry eyes which is most likely related to medication use.  Pain of right eye - Evaluated by Dr. Emily Filbert and diagnosed with trabeculitis.  She continues to use prednisone eyedrops.  Extensive workup including TB Gold, EBV, CMV, toxoplasmosis, hepatitis B were negative.  Lab results were discussed with the patient.  Primary osteoarthritis of both hands-she had bilateral DIP thickening and subluxation of some of the DIP joints.  Joint protection was discussed.  A  handout on hand exercises was given.  Chronic pain of right knee-she continues to have some discomfort in her knee joints.  No warmth swelling or effusion was noted.  A handout on lower extremity exercises was given.  Primary osteoarthritis of both feet-proper fitting shoes were advised.  Abnormal SPEP - Jul 06, 2022 SPEP showed bisalbuminemia -lab results were discussed with the patient.  Patient states that she has family history of blood cancer and would like to be evaluated by hematology.  I will place the referral.  Plan: Ambulatory referral to Hematology / Oncology  History of DVT (deep vein thrombosis) - First DVT in the postpartum and second after trauma.  Supraventricular tachycardia - S/p ablation.  Patient is followed by cardiologist.  She is on Plavix.  Primary hypertension  Chronic anticoagulation - On Plavix since 2023.  Varicose veins of bilateral lower extremities with other complications  Hashimoto's thyroiditis - Thyroglobulin antibody positive.  History of gastroesophageal reflux (GERD)  OSA (obstructive sleep apnea)  Orders: Orders Placed This Encounter  Procedures   Ambulatory referral to Hematology / Oncology   No orders of the defined types were placed in this encounter.    Follow-Up Instructions: Return if symptoms worsen or fail to improve, for Osteoarthritis, trabeculitis.   Pollyann Savoy, MD  Note - This record has been created using Animal nutritionist.  Chart creation errors have been sought, but may not always  have been located. Such creation errors do not reflect on  the standard of medical care.

## 2022-07-05 NOTE — Progress Notes (Unsigned)
Attempted to contact patient and left message to advise patient to call the office in regards to labs that are needed for upcoming appointment.   Some of the labs needed at the new patient appointment were not drawn/released. I have placed the orders as future orders. Patient needs to come prior to new patient follow up to have these drawn so they can be discussed at the appointment.

## 2022-07-06 DIAGNOSIS — R0789 Other chest pain: Secondary | ICD-10-CM | POA: Diagnosis not present

## 2022-07-06 DIAGNOSIS — R768 Other specified abnormal immunological findings in serum: Secondary | ICD-10-CM | POA: Diagnosis not present

## 2022-07-06 DIAGNOSIS — I471 Supraventricular tachycardia, unspecified: Secondary | ICD-10-CM | POA: Diagnosis not present

## 2022-07-06 DIAGNOSIS — Z111 Encounter for screening for respiratory tuberculosis: Secondary | ICD-10-CM | POA: Diagnosis not present

## 2022-07-06 DIAGNOSIS — R002 Palpitations: Secondary | ICD-10-CM | POA: Diagnosis not present

## 2022-07-06 DIAGNOSIS — H5711 Ocular pain, right eye: Secondary | ICD-10-CM | POA: Diagnosis not present

## 2022-07-06 DIAGNOSIS — I10 Essential (primary) hypertension: Secondary | ICD-10-CM | POA: Diagnosis not present

## 2022-07-06 NOTE — Progress Notes (Signed)
Patient returned call to the office. Patient advised some of the labs needed at the new patient appointment were not drawn/released. Patient advised we would like to have these result prior to her follow up visit so we may discuss them with her. Patient will come this afternoon to have them drawn.

## 2022-07-07 LAB — HEPATITIS B CORE ANTIBODY, IGM: Hep B C IgM: NONREACTIVE

## 2022-07-08 LAB — QUANTIFERON-TB GOLD PLUS
QuantiFERON-TB Gold Plus: NEGATIVE
TB1-NIL: 0.06 IU/mL
TB2-NIL: 0.06 IU/mL

## 2022-07-08 LAB — CMV ABS, IGG+IGM (CYTOMEGALOVIRUS)
CMV IgM: <30 AU/mL
Cytomegalovirus Ab-IgG: 9.2 U/mL — ABNORMAL HIGH

## 2022-07-08 LAB — EPSTEIN-BARR VIRUS VCA ANTIBODY PANEL: EBV NA IgG: <18 U/mL

## 2022-07-09 LAB — PROTEIN ELECTROPHORESIS, SERUM
Albumin ELP: 4.4 g/dL (ref 3.8–4.8)
Alpha 1: 0.3 g/dL (ref 0.2–0.3)
Alpha 2: 0.7 g/dL (ref 0.5–0.9)
Beta 2: 0.4 g/dL (ref 0.2–0.5)
Beta Globulin: 0.5 g/dL (ref 0.4–0.6)
Gamma Globulin: 0.8 g/dL (ref 0.8–1.7)
Total Protein: 7 g/dL (ref 6.1–8.1)

## 2022-07-09 LAB — QUANTIFERON-TB GOLD PLUS
Mitogen-NIL: 8.59 IU/mL
NIL: 0.02 IU/mL

## 2022-07-09 LAB — TOXOPLASMA GONDII ANTIBODY, IGM: Toxoplasma Antibody- IgM: <8 [AU]/ml

## 2022-07-09 LAB — EPSTEIN-BARR VIRUS VCA ANTIBODY PANEL
EBV VCA IgG: <18 U/mL
EBV VCA IgM: <36 U/mL

## 2022-07-12 NOTE — Progress Notes (Signed)
I will discuss results at the follow-up visit.

## 2022-07-13 DIAGNOSIS — R002 Palpitations: Secondary | ICD-10-CM | POA: Diagnosis not present

## 2022-07-13 DIAGNOSIS — I10 Essential (primary) hypertension: Secondary | ICD-10-CM | POA: Diagnosis not present

## 2022-07-13 DIAGNOSIS — I471 Supraventricular tachycardia, unspecified: Secondary | ICD-10-CM | POA: Diagnosis not present

## 2022-07-13 DIAGNOSIS — R0789 Other chest pain: Secondary | ICD-10-CM | POA: Diagnosis not present

## 2022-07-13 DIAGNOSIS — I517 Cardiomegaly: Secondary | ICD-10-CM | POA: Diagnosis not present

## 2022-07-15 ENCOUNTER — Encounter: Payer: Self-pay | Admitting: Rheumatology

## 2022-07-15 ENCOUNTER — Ambulatory Visit: Payer: Medicare Other | Attending: Rheumatology | Admitting: Rheumatology

## 2022-07-15 VITALS — BP 125/79 | HR 53 | Resp 17 | Ht 68.0 in | Wt 266.0 lb

## 2022-07-15 DIAGNOSIS — M19042 Primary osteoarthritis, left hand: Secondary | ICD-10-CM

## 2022-07-15 DIAGNOSIS — M19071 Primary osteoarthritis, right ankle and foot: Secondary | ICD-10-CM

## 2022-07-15 DIAGNOSIS — R768 Other specified abnormal immunological findings in serum: Secondary | ICD-10-CM | POA: Diagnosis not present

## 2022-07-15 DIAGNOSIS — Z86718 Personal history of other venous thrombosis and embolism: Secondary | ICD-10-CM

## 2022-07-15 DIAGNOSIS — E063 Autoimmune thyroiditis: Secondary | ICD-10-CM

## 2022-07-15 DIAGNOSIS — R778 Other specified abnormalities of plasma proteins: Secondary | ICD-10-CM

## 2022-07-15 DIAGNOSIS — Z8719 Personal history of other diseases of the digestive system: Secondary | ICD-10-CM

## 2022-07-15 DIAGNOSIS — I83893 Varicose veins of bilateral lower extremities with other complications: Secondary | ICD-10-CM

## 2022-07-15 DIAGNOSIS — M19041 Primary osteoarthritis, right hand: Secondary | ICD-10-CM

## 2022-07-15 DIAGNOSIS — H5711 Ocular pain, right eye: Secondary | ICD-10-CM | POA: Diagnosis not present

## 2022-07-15 DIAGNOSIS — M25561 Pain in right knee: Secondary | ICD-10-CM | POA: Diagnosis not present

## 2022-07-15 DIAGNOSIS — M19072 Primary osteoarthritis, left ankle and foot: Secondary | ICD-10-CM

## 2022-07-15 DIAGNOSIS — G8929 Other chronic pain: Secondary | ICD-10-CM

## 2022-07-15 DIAGNOSIS — I471 Supraventricular tachycardia, unspecified: Secondary | ICD-10-CM

## 2022-07-15 DIAGNOSIS — I10 Essential (primary) hypertension: Secondary | ICD-10-CM

## 2022-07-15 DIAGNOSIS — Z7901 Long term (current) use of anticoagulants: Secondary | ICD-10-CM

## 2022-07-15 DIAGNOSIS — G4733 Obstructive sleep apnea (adult) (pediatric): Secondary | ICD-10-CM

## 2022-07-15 NOTE — Patient Instructions (Signed)
Hand Exercises Hand exercises can be helpful for almost anyone. They can strengthen your hands and improve flexibility and movement. The exercises can also increase blood flow to the hands. These results can make your work and daily tasks easier for you. Hand exercises can be especially helpful for people who have joint pain from arthritis or nerve damage from using their hands over and over. These exercises can also help people who injure a hand. Exercises Most of these hand exercises are gentle stretching and motion exercises. It is usually safe to do them often throughout the day. Warming up your hands before exercise may help reduce stiffness. You can do this with gentle massage or by placing your hands in warm water for 10-15 minutes. It is normal to feel some stretching, pulling, tightness, or mild discomfort when you begin new exercises. In time, this will improve. Remember to always be careful and stop right away if you feel sudden, very bad pain or your pain gets worse. You want to get better and be safe. Ask your health care provider which exercises are safe for you. Do exercises exactly as told by your provider and adjust them as told. Do not begin these exercises until told by your provider. Knuckle bend or "claw" fist  Stand or sit with your arm, hand, and all five fingers pointed straight up. Make sure to keep your wrist straight. Gently bend your fingers down toward your palm until the tips of your fingers are touching your palm. Keep your big knuckle straight and only bend the small knuckles in your fingers. Hold this position for 10 seconds. Straighten your fingers back to your starting position. Repeat this exercise 5-10 times with each hand. Full finger fist  Stand or sit with your arm, hand, and all five fingers pointed straight up. Make sure to keep your wrist straight. Gently bend your fingers into your palm until the tips of your fingers are touching the middle of your  palm. Hold this position for 10 seconds. Extend your fingers back to your starting position, stretching every joint fully. Repeat this exercise 5-10 times with each hand. Straight fist  Stand or sit with your arm, hand, and all five fingers pointed straight up. Make sure to keep your wrist straight. Gently bend your fingers at the big knuckle, where your fingers meet your hand, and at the middle knuckle. Keep the knuckle at the tips of your fingers straight and try to touch the bottom of your palm. Hold this position for 10 seconds. Extend your fingers back to your starting position, stretching every joint fully. Repeat this exercise 5-10 times with each hand. Tabletop  Stand or sit with your arm, hand, and all five fingers pointed straight up. Make sure to keep your wrist straight. Gently bend your fingers at the big knuckle, where your fingers meet your hand, as far down as you can. Keep the small knuckles in your fingers straight. Think of forming a tabletop with your fingers. Hold this position for 10 seconds. Extend your fingers back to your starting position, stretching every joint fully. Repeat this exercise 5-10 times with each hand. Finger spread  Place your hand flat on a table with your palm facing down. Make sure your wrist stays straight. Spread your fingers and thumb apart from each other as far as you can until you feel a gentle stretch. Hold this position for 10 seconds. Bring your fingers and thumb tight together again. Hold this position for 10 seconds. Repeat  this exercise 5-10 times with each hand. Making circles  Stand or sit with your arm, hand, and all five fingers pointed straight up. Make sure to keep your wrist straight. Make a circle by touching the tip of your thumb to the tip of your index finger. Hold for 10 seconds. Then open your hand wide. Repeat this motion with your thumb and each of your fingers. Repeat this exercise 5-10 times with each hand. Thumb  motion  Sit with your forearm resting on a table and your wrist straight. Your thumb should be facing up toward the ceiling. Keep your fingers relaxed as you move your thumb. Lift your thumb up as high as you can toward the ceiling. Hold for 10 seconds. Bend your thumb across your palm as far as you can, reaching the tip of your thumb for the small finger (pinkie) side of your palm. Hold for 10 seconds. Repeat this exercise 5-10 times with each hand. Grip strengthening  Hold a stress ball or other soft ball in the middle of your hand. Slowly increase the pressure, squeezing the ball as much as you can without causing pain. Think of bringing the tips of your fingers into the middle of your palm. All of your finger joints should bend when doing this exercise. Hold your squeeze for 10 seconds, then relax. Repeat this exercise 5-10 times with each hand. Contact a health care provider if: Your hand pain or discomfort gets much worse when you do an exercise. Your hand pain or discomfort does not improve within 2 hours after you exercise. If you have either of these problems, stop doing these exercises right away. Do not do them again unless your provider says that you can. Get help right away if: You develop sudden, severe hand pain or swelling. If this happens, stop doing these exercises right away. Do not do them again unless your provider says that you can. This information is not intended to replace advice given to you by your health care provider. Make sure you discuss any questions you have with your health care provider. Document Revised: 02/15/2022 Document Reviewed: 02/15/2022 Elsevier Patient Education  2024 [employer] Exercises for Chronic Knee Pain Chronic knee pain is pain that lasts longer than 3 months. For most people with chronic knee pain, exercise and weight loss is an important part of treatment. Your health care provider may want you to focus on: Making the muscles that  support your knee stronger. This can take pressure off your knee and reduce pain. Preventing knee stiffness. How far you can move your knee, keeping it there or making it farther. Losing weight (if this applies) to take pressure off your knee, lower your risk for injury, and make it easier for you to exercise. Your provider will help you make an exercise program that fits your needs and physical abilities. Below are simple, low-impact exercises you can do at home. Ask your provider or physical therapist how often you should do your exercise program and how many times to repeat each exercise. General safety tips  Get your provider's approval before doing any exercises. Start slowly and stop any time you feel pain. Do not exercise if your knee pain is flaring up. Warm up first. Stretching a cold muscle can cause an injury. Do 5-10 minutes of easy movement or light stretching before beginning your exercises. Do 5-10 minutes of low-impact activity (like walking or cycling) before starting strengthening exercises. Contact your provider any time you have pain during  or after exercising. Exercise can cause discomfort but should not be painful. It is normal to be a little stiff or sore after exercising. Stretching and range-of-motion exercises Front thigh stretch  Stand up straight and support your body by holding on to a chair or resting one hand on a wall. With your legs straight and close together, bend one knee to lift your heel up toward your butt. Using one hand for support, grab your ankle with your free hand. Pull your foot up closer toward your butt to feel the stretch in front of your thigh. Hold the stretch for 30 seconds. Repeat __________ times. Complete this exercise __________ times a day. Back thigh stretch  Sit on the floor with your back straight and your legs out straight in front of you. Place the palms of your hands on the floor and slide them toward your feet as you bend at the  hip. Try to touch your nose to your knees and feel the stretch in the back of your thighs. Hold for 30 seconds. Repeat __________ times. Complete this exercise __________ times a day. Calf stretch  Stand facing a wall. Place the palms of your hands flat against the wall, arms extended, and lean slightly against the wall. Get into a lunge position with one leg bent at the knee and the other leg stretched out straight behind you. Keep both feet facing the wall and increase the bend in your knee while keeping the heel of the other leg flat on the ground. You should feel the stretch in your calf. Hold for 30 seconds. Repeat __________ times. Complete this exercise __________ times a day. Strengthening exercises Straight leg lift  Lie on your back with one knee bent and the other leg out straight. Slowly lift the straight leg without bending the knee. Lift until your foot is about 12 inches (30 cm) off the floor. Hold for 3-5 seconds and slowly lower your leg. Repeat __________ times. Complete this exercise __________ times a day. Single leg dip  Stand between two chairs and put both hands on the backs of the chairs for support. Extend one leg out straight with your body weight resting on the heel of the standing leg. Slowly bend your standing knee to dip your body to the level that is comfortable for you. Hold for 3-5 seconds. Repeat __________ times. Complete this exercise __________ times a day. Hamstring curls  Stand straight, knees close together, facing the back of a chair. Hold on to the back of a chair with both hands. Keep one leg straight. Bend the other knee while bringing the heel up toward the butt until the knee is bent at a 90-degree angle (right angle). Hold for 3-5 seconds. Repeat __________ times. Complete this exercise __________ times a day. Wall squat  Stand straight with your back, hips, and head against a wall. Step forward one foot at a time with your back  still against the wall. Your feet should be 2 feet (61 cm) from the wall at shoulder width. Keeping your back, hips, and head against the wall, slide down the wall to as close to a sitting position as you can get. Hold for 5-10 seconds, then slowly slide back up. Repeat __________ times. Complete this exercise __________ times a day. Step-ups  Stand in front of a sturdy platform or stool that is about 6 inches (15 cm) high. Slowly step up with your left / right foot, keeping your knee in line with your hip  and foot. Do not let your knee bend so far that you cannot see your toes. Hold on to a chair for balance, but do not use it for support. Slowly unlock your knee and lower yourself to the starting position. Repeat __________ times. Complete this exercise __________ times a day. Contact a health care provider if: Your exercises cause pain. Your pain is worse after you exercise. Your pain prevents you from doing your exercises. This information is not intended to replace advice given to you by your health care provider. Make sure you discuss any questions you have with your health care provider. Document Revised: 02/15/2022 Document Reviewed: 02/15/2022 Elsevier Patient Education  2024 [employer].

## 2022-07-22 ENCOUNTER — Ambulatory Visit: Payer: Medicare Other | Admitting: Podiatry

## 2022-07-22 DIAGNOSIS — L600 Ingrowing nail: Secondary | ICD-10-CM

## 2022-07-22 NOTE — Progress Notes (Signed)
Subjective:  Patient ID: Tamara Morgan, female    DOB: December 05, 1955,  MRN: [id number]  Chief Complaint  Patient presents with   Ingrown Toenail    left great toenail ingrown nail/ bilateral borders and right hallux ingrown medial border.   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 female presents with concern for bilateral hallux nail ingrown.  She has pain on both borders of the left great toe event on the medial border of the right hallux.  She has been doing this for a long time.  She has been getting pedicures and they have tried to take them out but they are not fully going away and she is having pain with them and does not want them to get infected.  Past Medical History:  Diagnosis Date   Arrhythmia    Benign tumor    Cardiac anomaly    GERD (gastroesophageal reflux disease)    Hashimoto's disease    History of bone density study    Hyperlipidemia    Hypertension    Lyme disease    Malaria    OSA (obstructive sleep apnea)    Pap smear, as part of routine gynecological examination 2022    Allergies  Allergen Reactions   Tramadol Other (See Comments)    Hyperanxiety  Panic attacks    Aspirin     Edema    Betadine [Povidone Iodine] Hives   Codeine     Central nervous system    Cortisone     Unknown   Gluten Meal     unknown   Mushroom Extract Complex     Fungi infection    Onion     Reflux   Prednisone     oral   Premarin [Conjugated Estrogens] Hives    Oral premarin    Tine Test [Old Tuberculin] Swelling   Tomato     Reflux   Valium [Diazepam] Nausea Only    Stop breathing    Versed [Midazolam] Nausea And Vomiting    Out of body experience    Wound Dressing Adhesive Hives   Iodine Rash    ROS: Negative except as per HPI above  Objective:  General: AAO x3, NAD  Dermatological: Incurvation is present along the bilateral nail border of the bilateral great toe. There is localized edema without any erythema or increase  in warmth around the nail border. There is no drainage or pus. There is no ascending cellulitis. No malodor. No open lesions or pre-ulcerative lesions.    Vascular:  Dorsalis Pedis artery and Posterior Tibial artery pedal pulses are 2/4 bilateral.  Capillary fill time < 3 sec to all digits.   Neruologic: Grossly intact via light touch bilateral. Protective threshold intact to all sites bilateral.   Musculoskeletal: No gross boney pedal deformities bilateral. No pain, crepitus, or limitation noted with foot and ankle range of motion bilateral. Muscular strength 5/5 in all groups tested bilateral.  Gait: Unassisted, Nonantalgic.   No images are attached to the encounter.   Assessment:   1. Ingrown nail of great toe of right foot   2. Ingrown nail of great toe of left foot      Plan:  Patient was evaluated and treated and all questions answered.  Ingrown Nail, bilaterally -Patient elects to proceed with minor surgery to remove ingrown toenail today. Consent reviewed and signed by patient. -Ingrown nail excised. See procedure note. -Educated on post-procedure care including soaking. Written  instructions provided and reviewed. -Patient to follow up in 2 weeks for nail check.  Procedure: Excision of Ingrown Toenail Location: Bilateral 1st toe  bilateral  nail borders. Anesthesia: Lidocaine 1% plain; 1.5 mL and Marcaine 0.5% plain; 1.5 mL, digital block. Skin Prep: Betadine. Dressing: Silvadene; telfa; dry, sterile, compression dressing. Technique: Following skin prep, the toe was exsanguinated and a tourniquet was secured at the base of the toe. The affected nail border was freed, split with a nail splitter, and excised. Chemical matrixectomy was then performed with phenol and irrigated out with alcohol. The tourniquet was then removed and sterile dressing applied. Disposition: Patient tolerated procedure well. Patient to return in 2 weeks for follow-up.    Return in about 2 weeks  (around 08/05/2022) for nail check.          Corinna Gab, DPM [facility] / [facility]

## 2022-07-22 NOTE — Patient Instructions (Signed)

## 2022-07-23 ENCOUNTER — Other Ambulatory Visit: Payer: Self-pay | Admitting: Adult Health

## 2022-08-01 DIAGNOSIS — H2011 Chronic iridocyclitis, right eye: Secondary | ICD-10-CM | POA: Diagnosis not present

## 2022-08-01 DIAGNOSIS — H401111 Primary open-angle glaucoma, right eye, mild stage: Secondary | ICD-10-CM | POA: Diagnosis not present

## 2022-08-01 DIAGNOSIS — H25811 Combined forms of age-related cataract, right eye: Secondary | ICD-10-CM | POA: Diagnosis not present

## 2022-08-03 DIAGNOSIS — K219 Gastro-esophageal reflux disease without esophagitis: Secondary | ICD-10-CM | POA: Diagnosis not present

## 2022-08-03 DIAGNOSIS — R600 Localized edema: Secondary | ICD-10-CM | POA: Diagnosis not present

## 2022-08-03 DIAGNOSIS — K760 Fatty (change of) liver, not elsewhere classified: Secondary | ICD-10-CM | POA: Diagnosis not present

## 2022-08-03 DIAGNOSIS — I10 Essential (primary) hypertension: Secondary | ICD-10-CM | POA: Diagnosis not present

## 2022-08-05 ENCOUNTER — Ambulatory Visit: Payer: Medicare Other | Admitting: Podiatry

## 2022-08-05 DIAGNOSIS — L600 Ingrowing nail: Secondary | ICD-10-CM

## 2022-08-05 NOTE — Progress Notes (Signed)
Subjective: Tamara Morgan is a 67 y.o.  female returns to office today for follow up evaluation after having bilateral Hallux bilateral border nail ingrown removal with phenol and alcohol matrixectomy approximately 2 weeks ago. Patient has been soaking using epsom salts and applying topical antibiotic covered with bandaid daily. Patient denies fevers, chills, nausea, vomiting. Denies any calf pain, chest pain, SOB.   Objective:  Vitals: Reviewed  General: Well developed, nourished, in no acute distress, alert and oriented x3   Dermatology: Skin is warm, dry and supple bilateral. bilateral hallux nail border appears to be clean, dry, with mild granular tissue and surrounding scab. There is no surrounding erythema, edema, drainage/purulence. The remaining nails appear unremarkable at this time. There are no other lesions or other signs of infection present.  Neurovascular status: Intact. No lower extremity swelling; No pain with calf compression bilateral.  Musculoskeletal: Decreased tenderness to palpation of the bilateral hallux nail fold(s). Muscular strength within normal limits bilateral.   Assesement and Plan: S/p phenol and alcohol matrixectomy to the  bilateral hallux nail bilateral, doing well.   -Continue soaking in epsom salts twice a day followed by antibiotic ointment and a band-aid. Can leave uncovered at night. Continue this until completely healed.  -If the area has not healed in 2 weeks, call the office for follow-up appointment, or sooner if any problems arise.  -Monitor for any signs/symptoms of infection. Call the office immediately if any occur or go directly to the emergency room. Call with any questions/concerns.        Corinna Gab, DPM [facility] / [facility]                   08/05/2022

## 2022-08-06 ENCOUNTER — Other Ambulatory Visit: Payer: Self-pay | Admitting: Podiatry

## 2022-08-06 ENCOUNTER — Telehealth: Payer: Self-pay | Admitting: Podiatry

## 2022-08-06 ENCOUNTER — Encounter: Payer: Self-pay | Admitting: Podiatry

## 2022-08-06 DIAGNOSIS — G4733 Obstructive sleep apnea (adult) (pediatric): Secondary | ICD-10-CM | POA: Diagnosis not present

## 2022-08-06 MED ORDER — CEPHALEXIN 500 MG PO CAPS: 500.0000 mg | ORAL_CAPSULE | Freq: Three times a day (TID) | ORAL | 0 refills | Status: DC

## 2022-08-06 NOTE — Telephone Encounter (Signed)
Patient called the answering service again stating that both toes are red and inflamed and painful.  I tried to call the patient again for the fourth time and he goes right to voicemail.  Antibiotic sent over.

## 2022-08-06 NOTE — Telephone Encounter (Signed)
Patient called the answering service with concerned about pain from her toenail. I tried calling x 2 and it went to VM. I left message to call back.

## 2022-08-08 ENCOUNTER — Other Ambulatory Visit: Payer: Self-pay | Admitting: Podiatry

## 2022-08-08 ENCOUNTER — Telehealth: Payer: Self-pay | Admitting: *Deleted

## 2022-08-08 DIAGNOSIS — N3941 Urge incontinence: Secondary | ICD-10-CM | POA: Diagnosis not present

## 2022-08-08 MED ORDER — DOXYCYCLINE HYCLATE 100 MG PO TABS: 100.0000 mg | ORAL_TABLET | Freq: Two times a day (BID) | ORAL | 0 refills | Status: AC

## 2022-08-08 NOTE — Telephone Encounter (Signed)
Patient said that she is allergic to the cephalexin, did not pick up. She has taken doxycycline before and it should be ok. The toe is still a little darker pink and the throbbing has gone away, would still like something to take just in case she has another flare up.

## 2022-08-08 NOTE — Progress Notes (Signed)
Doxycycline sent to pharmacy

## 2022-08-09 DIAGNOSIS — K219 Gastro-esophageal reflux disease without esophagitis: Secondary | ICD-10-CM | POA: Diagnosis not present

## 2022-08-09 DIAGNOSIS — R0789 Other chest pain: Secondary | ICD-10-CM | POA: Diagnosis not present

## 2022-08-09 DIAGNOSIS — I10 Essential (primary) hypertension: Secondary | ICD-10-CM | POA: Diagnosis not present

## 2022-08-09 DIAGNOSIS — I471 Supraventricular tachycardia, unspecified: Secondary | ICD-10-CM | POA: Diagnosis not present

## 2022-08-09 DIAGNOSIS — R1310 Dysphagia, unspecified: Secondary | ICD-10-CM | POA: Diagnosis not present

## 2022-08-09 DIAGNOSIS — R002 Palpitations: Secondary | ICD-10-CM | POA: Diagnosis not present

## 2022-08-09 NOTE — Telephone Encounter (Signed)
Patient updated and has picked up prescription.

## 2022-08-12 DIAGNOSIS — R002 Palpitations: Secondary | ICD-10-CM | POA: Diagnosis not present

## 2022-08-12 DIAGNOSIS — I10 Essential (primary) hypertension: Secondary | ICD-10-CM | POA: Diagnosis not present

## 2022-08-12 DIAGNOSIS — R0789 Other chest pain: Secondary | ICD-10-CM | POA: Diagnosis not present

## 2022-08-12 DIAGNOSIS — I471 Supraventricular tachycardia, unspecified: Secondary | ICD-10-CM | POA: Diagnosis not present

## 2022-08-19 ENCOUNTER — Other Ambulatory Visit: Payer: Self-pay

## 2022-08-19 ENCOUNTER — Inpatient Hospital Stay: Payer: Medicare Other | Attending: Hematology | Admitting: Hematology

## 2022-08-19 ENCOUNTER — Encounter: Payer: Self-pay | Admitting: Hematology

## 2022-08-19 ENCOUNTER — Inpatient Hospital Stay: Payer: Medicare Other

## 2022-08-19 VITALS — BP 140/80 | HR 60 | Temp 97.8°F | Resp 18 | Ht 67.5 in | Wt 271.2 lb

## 2022-08-19 DIAGNOSIS — R778 Other specified abnormalities of plasma proteins: Secondary | ICD-10-CM | POA: Diagnosis not present

## 2022-08-19 DIAGNOSIS — Z90710 Acquired absence of both cervix and uterus: Secondary | ICD-10-CM | POA: Diagnosis not present

## 2022-08-19 DIAGNOSIS — I10 Essential (primary) hypertension: Secondary | ICD-10-CM | POA: Diagnosis not present

## 2022-08-19 DIAGNOSIS — M7989 Other specified soft tissue disorders: Secondary | ICD-10-CM | POA: Diagnosis not present

## 2022-08-19 DIAGNOSIS — Z86718 Personal history of other venous thrombosis and embolism: Secondary | ICD-10-CM | POA: Insufficient documentation

## 2022-08-19 DIAGNOSIS — Z8249 Family history of ischemic heart disease and other diseases of the circulatory system: Secondary | ICD-10-CM | POA: Insufficient documentation

## 2022-08-19 DIAGNOSIS — Z90721 Acquired absence of ovaries, unilateral: Secondary | ICD-10-CM | POA: Diagnosis not present

## 2022-08-19 DIAGNOSIS — R1013 Epigastric pain: Secondary | ICD-10-CM

## 2022-08-19 NOTE — Progress Notes (Unsigned)
[facility]   [phone] [phone]   Clinic New Consult Note   Patient Care Team: Jackelyn Poling, DO as PCP - General (Family Medicine)  Date of Service:  08/19/2022   CHIEF COMPLAINTS/PURPOSE OF CONSULTATION:  Abnormal SPEP  REFERRING PHYSICIAN:  Pollyann Savoy, MD  HISTORY OF PRESENTING ILLNESS:  Tamara Morgan 67 y.o. female is a here because of abnormal SPEP. The patient was referred by Pollyann Savoy, MD. The patient presents to the clinic today accompanied by husband.  Patient states in the fall 2023 she developed right eye visual changes.  Pt had some temporarily blindness. She was seen by Dr. Emily Filbert and was diagnosed with increased intraocular pressure.  She states she had extensive workup and follow-up and was finally diagnosed with reticulitis.  She has been on eyedrops.  Due to positive ANA which was found during her workup, she was referred to rheumatologist Dr. Corliss Skains.  Rheumatological workup for negative. SPEP was obtained during the workup, which showed bisalbuminemia, negative for M protein and total protein and gamma globulin levels were normal.  Patient was referred to [place] for further evaluation.   She had LE DVT twice in the past, first 1 happened right after she delivered her daughter, and second episode was after an accident.  She was treated with anticoagulation and has been all for long time.  She also has a family history of thrombosis, and would like to be tested to see if she has inheritable thrombophilia.  She is doing well overall.  Pt has some swelling in her feet. She has stated that she has joint pain that she has learn to adapt to. Pt reports of a constant pain on her right side. For a while now.    She has a PMHx of.... Rheumatoid Arthritis  Hypertension -GERD Thyroid disease Hysterectomy Father -heart cancer   Socially... Married 3 children Social drinker   REVIEW OF SYSTEMS:   Constitutional:  (-)Denies fevers, chills or (+) abnormal night sweats Eyes: (-)Denies blurriness of vision, double vision or watery eyes Ears, nose, mouth, throat, and face: Denies mucositis or sore throat Respiratory: (-) Denies cough, dyspnea or wheezes Cardiovascular: (-)Denies palpitation, chest discomfort or lower extremity swelling Gastrointestinal: (-)  Denies nausea, heartburn or change in bowel habits Skin: (-) Denies abnormal skin rashes Lymphatics:(-) Denies new lymphadenopathy or easy bruising Neurological:(-) Denies numbness, tingling or new weaknesses Behavioral/Psych:(-)  Mood is stable, no new changes  All other systems were reviewed with the patient and are negative.   MEDICAL HISTORY:  Past Medical History:  Diagnosis Date   Arrhythmia    Benign tumor    Cardiac anomaly    GERD (gastroesophageal reflux disease)    Hashimoto's disease    History of bone density study    Hyperlipidemia    Hypertension    Lyme disease    Malaria    OSA (obstructive sleep apnea)    Pap smear, as part of routine gynecological examination 2022    SURGICAL HISTORY: Past Surgical History:  Procedure Laterality Date   ABDOMINAL HYSTERECTOMY     CARDIAC CATHETERIZATION     COLONOSCOPY     several   ECTOPIC PREGNANCY SURGERY     OOPHORECTOMY Left    RECTOCELE REPAIR     VAGINAL HYSTERECTOMY      SOCIAL HISTORY: Social History   Socioeconomic History   Marital status: Married    Spouse name: Not on file   Number of children: 3   Years  of education: Not on file   Highest education level: Not on file  Occupational History   Not on file  Tobacco Use   Smoking status: Never    Passive exposure: Past   Smokeless tobacco: Never  Vaping Use   Vaping Use: Never used  Substance and Sexual Activity   Alcohol use: Yes    Alcohol/week: 1.0 - 2.0 standard drink of alcohol    Types: 1 - 2 Glasses of wine per week    Comment: occ   Drug use: Never   Sexual activity: Not on file  Other Topics  Concern   Not on file  Social History Narrative   Not on file   Social Determinants of Health   Financial Resource Strain: Not on file  Food Insecurity: Not on file  Transportation Needs: Not on file  Physical Activity: Not on file  Stress: Not on file  Social Connections: Not on file  Intimate Partner Violence: Not on file    FAMILY HISTORY: Family History  Problem Relation Age of Onset   Hypertension Mother    Hypothyroidism Mother    Heart Problems Mother        Heart stent   Dementia Mother 36   Arthritis Mother    Arthritis Father    Cancer Father        TTR   Hypertension Father    Diabetes Father        type 2   Hypertension Daughter    Appendicitis Daughter    Hypertension Son     ALLERGIES:  is allergic to tramadol, aspirin, betadine [povidone iodine], codeine, cortisone, gluten meal, mushroom extract complex, onion, prednisone, premarin [conjugated estrogens], tine test [old tuberculin], tomato, valium [diazepam], versed [midazolam], wound dressing adhesive, and iodine.  MEDICATIONS:  Current Outpatient Medications  Medication Sig Dispense Refill   Acetylcysteine (NAC PO) Take 1,200 mg by mouth daily.     Azelastine HCl 137 MCG/SPRAY SOLN Place 1-2 sprays into the nose in the morning and at bedtime.     Brinzolamide-Brimonidine (SIMBRINZA) 1-0.2 % SUSP Apply 1 drop to eye in the morning and at bedtime.     Calcium-Magnesium (CAL/MAG PO) Take by mouth.     Carboxymethylcellul-Glycerin (REFRESH TEARS PF OP) Apply to eye as needed.     cephALEXin (KEFLEX) 500 MG capsule Take 1 capsule (500 mg total) by mouth 3 (three) times daily. 21 capsule 0   cetirizine (ZYRTEC) 10 MG tablet Take 10 mg by mouth daily.     chlorhexidine (PERIDEX) 0.12 % solution Use as directed 15 mLs in the mouth or throat as needed.     Cholecalciferol (VITAMIN D-3 PO) Take by mouth.     clopidogrel (PLAVIX) 75 MG tablet Take 75 mg by mouth daily.     diphenhydrAMINE-PE-APAP (ALLERGY  RELIEF PLUS SINUS PO) Take 50 mg by mouth at bedtime.     fluticasone (FLOVENT HFA) 110 MCG/ACT inhaler Inhale 2 puffs into the lungs as needed.     Ibuprofen-Acetaminophen (MOTRIN DUAL ACTION) 125-250 MG TABS Take 1 tablet by mouth as needed.     losartan (COZAAR) 25 MG tablet Take 25 mg by mouth in the morning and at bedtime.     Melatonin 10 MG TABS Take 20 mg by mouth at bedtime.     metoprolol succinate (TOPROL-XL) 25 MG 24 hr tablet Take 1 tablet (25 mg total) by mouth daily. 30 tablet 0   Misc Natural Products (TURMERIC CURCUMIN) CAPS Take 2,250 capsules by  mouth daily.     Nutritional Supplements (COLD AND FLU PO) Take 2 tablets by mouth at bedtime. (Patient not taking: Reported on 07/15/2022)     pantoprazole (PROTONIX) 40 MG tablet Take 1 tablet (40 mg total) by mouth every evening. 30 tablet 0   Phenylephrine-Acetaminophen (TYLENOL SINUS CONGESTION/PAIN PO) Take 1 tablet by mouth 2 (two) times daily.     Povidone, PF, (IVIZIA DRY EYES) 0.5 % SOLN Apply 2 drops to eye in the morning and at bedtime.     prednisoLONE Acetate-Nepafenac 1-0.1 % SUSP Apply 1 drop to eye daily. Right eye     sodium chloride (MURO 128) 5 % ophthalmic ointment Place 1 Application into both eyes at bedtime.     No current facility-administered medications for this visit.    PHYSICAL EXAMINATION: ECOG PERFORMANCE STATUS: 0 - Asymptomatic  Vitals:   08/19/22 1555  BP: (!) 140/80  Pulse: 60  Resp: 18  Temp: 97.8 F (36.6 C)  SpO2: 93%   Filed Weights   08/19/22 1555  Weight: 271 lb 3.2 oz (123 kg)     GENERAL:alert, no distress and comfortable SKIN: skin color normal, no rashes or significant lesions EYES: normal, Conjunctiva are pink and non-injected, sclera clear  NEURO: alert & oriented x 3 with fluent speech NECK: (-)supple, thyroid normal size, non-tender, without nodularity LYMPH:  (-)no palpable lymphadenopathy in the cervical, axillary  LUNGS: (-) clear to auscultation and percussion  with normal breathing effort HEART: (-) regular rate & rhythm and no murmurs and no lower extremity edema ABDOMEN:(-)abdomen soft, (-) non-tender and (-) normal bowel sounds  LABORATORY DATA:  I have reviewed the data as listed    Latest Ref Rng & Units 03/25/2022   11:52 AM 10/17/2021   10:40 PM 08/11/2020    2:49 PM  CBC  WBC 3.8 - 10.8 Thousand/uL 6.0  6.9  6.9   Hemoglobin 11.7 - 15.5 g/dL 16.1  09.6  04.5   Hematocrit 35.0 - 45.0 % 41.0  39.2  42.8   Platelets 140 - 400 Thousand/uL 267  251  267        Latest Ref Rng & Units 07/06/2022    3:17 PM 03/25/2022   11:52 AM 10/17/2021   10:40 PM  CMP  Glucose 65 - 99 mg/dL  409  811   BUN 7 - 25 mg/dL  15  13   Creatinine 9.14 - 1.05 mg/dL  7.82  9.56   Sodium 213 - 146 mmol/L  142  142   Potassium 3.5 - 5.3 mmol/L  4.3  3.9   Chloride 98 - 110 mmol/L  106  109   CO2 20 - 32 mmol/L  25  27   Calcium 8.6 - 10.4 mg/dL  9.7  9.0   Total Protein 6.1 - 8.1 g/dL 7.0  7.2  6.9   Total Bilirubin 0.2 - 1.2 mg/dL  0.4  0.4   Alkaline Phos 38 - 126 U/L   71   AST 10 - 35 U/L  23  25   ALT 6 - 29 U/L  28  25      RADIOGRAPHIC STUDIES: I have personally reviewed the radiological images as listed and agreed with the findings in the report. No results found.  ASSESSMENT & PLAN:  Eugenie Saiz is a 67 y.o.  female with a history of    Abnormal SPEP  -She had lab workup by her rheumatologist for positive ANA, which were mainly negative.  SPEP was obtained during the workup, which showed bisalbuminemia, negative for M protein and total protein and gamma globulin levels were normal. -No evidence of MGUS or multiple myeloma.  I am not sure what the cause of her bisalbuminemia, the interpretation of the test is "This pattern may occur when  patients are taking high doses of Beta-lactamic anti-biotics (such as penicillin), suffering from pancreatic disease, or it may be the result of a rare genetic disorder, Hereditary Bisalbuminemia.". pt has  gallbladder stone and intermittent right upper quadrant pain after fatty meal.  -I will repeat SPEP and check vitamin levels, and lipase. -If the repeated SPEP is normal, no need for further workup.  2.  Recurrent DVT -she had two episodes of DVT in her lower extremity, both were provoked.  -Patient is concerned about her future risk of thrombosis, she has a positive family history of thrombosis, I think it is reasonable to do genetic testing to rule out inheritable thrombophilia -Will obtain lab workup next week    PLAN:  -repeat Lab in 1 week for SPEP and hypercoagulopathy workup -F/u with phone visit in 3 weeks.  No orders of the defined types were placed in this encounter.   All questions were answered. The patient knows to call the clinic with any problems, questions or concerns. The total time spent in the appointment was 45 minutes.     Malachy Mood, MD 08/19/2022 4:15 PM  I, Monica Martinez, am acting as scribe for Malachy Mood, MD.   I have reviewed the above documentation for accuracy and completeness, and I agree with the above.

## 2022-08-25 DIAGNOSIS — K080 Exfoliation of teeth due to systemic causes: Secondary | ICD-10-CM | POA: Diagnosis not present

## 2022-08-26 ENCOUNTER — Other Ambulatory Visit: Payer: Self-pay

## 2022-08-26 ENCOUNTER — Inpatient Hospital Stay: Payer: Medicare Other

## 2022-08-26 DIAGNOSIS — R1013 Epigastric pain: Secondary | ICD-10-CM

## 2022-08-26 DIAGNOSIS — Z8249 Family history of ischemic heart disease and other diseases of the circulatory system: Secondary | ICD-10-CM | POA: Diagnosis not present

## 2022-08-26 DIAGNOSIS — R778 Other specified abnormalities of plasma proteins: Secondary | ICD-10-CM | POA: Diagnosis not present

## 2022-08-26 DIAGNOSIS — Z86718 Personal history of other venous thrombosis and embolism: Secondary | ICD-10-CM | POA: Diagnosis not present

## 2022-08-26 DIAGNOSIS — I10 Essential (primary) hypertension: Secondary | ICD-10-CM | POA: Diagnosis not present

## 2022-08-26 DIAGNOSIS — Z90710 Acquired absence of both cervix and uterus: Secondary | ICD-10-CM | POA: Diagnosis not present

## 2022-08-26 DIAGNOSIS — M7989 Other specified soft tissue disorders: Secondary | ICD-10-CM | POA: Diagnosis not present

## 2022-08-26 DIAGNOSIS — Z90721 Acquired absence of ovaries, unilateral: Secondary | ICD-10-CM | POA: Diagnosis not present

## 2022-08-26 LAB — D-DIMER, QUANTITATIVE: D-Dimer, Quant: <0.27 ug/mL-FEU (ref 0.00–0.50)

## 2022-08-26 LAB — LIPASE, BLOOD: Lipase: 31 U/L (ref 11–51)

## 2022-08-27 LAB — ANTITHROMBIN III: AntiThromb III Func: 98 % (ref 75–120)

## 2022-08-27 LAB — HOMOCYSTEINE: Homocysteine: 10.7 umol/L (ref 0.0–17.2)

## 2022-08-28 LAB — BETA-2-GLYCOPROTEIN I ABS, IGG/M/A
Beta-2 Glyco I IgG: <9 GPI IgG units (ref 0–20)
Beta-2-Glycoprotein I IgA: <9 GPI IgA units (ref 0–25)
Beta-2-Glycoprotein I IgM: <9 GPI IgM units (ref 0–32)

## 2022-08-29 LAB — KAPPA/LAMBDA LIGHT CHAINS
Kappa free light chain: 19.2 mg/L (ref 3.3–19.4)
Kappa, lambda light chain ratio: 1.52 (ref 0.26–1.65)
Lambda free light chains: 12.6 mg/L (ref 5.7–26.3)

## 2022-08-29 LAB — CARDIOLIPIN ANTIBODIES, IGG, IGM, IGA
Anticardiolipin IgA: <9 APL U/mL (ref 0–11)
Anticardiolipin IgG: <9 GPL U/mL (ref 0–14)
Anticardiolipin IgM: <9 MPL U/mL (ref 0–12)

## 2022-08-29 LAB — PROTEIN S, TOTAL: Protein S Ag, Total: 104 % (ref 60–150)

## 2022-08-29 LAB — PROTEIN C ACTIVITY: Protein C Activity: 139 % (ref 73–180)

## 2022-08-29 LAB — LUPUS ANTICOAGULANT PANEL
DRVVT: 40.4 s (ref 0.0–47.0)
PTT Lupus Anticoagulant: 37.1 s (ref 0.0–43.5)

## 2022-08-29 LAB — PROTEIN S ACTIVITY: Protein S Activity: 102 % (ref 63–140)

## 2022-08-30 LAB — PROTEIN ELECTROPHORESIS, SERUM, WITH REFLEX
A/G Ratio: 1.3 (ref 0.7–1.7)
Albumin ELP: 3.7 g/dL (ref 2.9–4.4)
Alpha-1-Globulin: 0.2 g/dL (ref 0.0–0.4)
Alpha-2-Globulin: 0.8 g/dL (ref 0.4–1.0)
Beta Globulin: 1.1 g/dL (ref 0.7–1.3)
Gamma Globulin: 0.8 g/dL (ref 0.4–1.8)
Globulin, Total: 2.8 g/dL (ref 2.2–3.9)
Total Protein ELP: 6.5 g/dL (ref 6.0–8.5)

## 2022-08-30 LAB — PROTEIN C, TOTAL: Protein C, Total: 110 % (ref 60–150)

## 2022-08-31 LAB — PROTHROMBIN GENE MUTATION

## 2022-09-01 DIAGNOSIS — H40051 Ocular hypertension, right eye: Secondary | ICD-10-CM | POA: Diagnosis not present

## 2022-09-06 DIAGNOSIS — R5383 Other fatigue: Secondary | ICD-10-CM | POA: Diagnosis not present

## 2022-09-06 DIAGNOSIS — J029 Acute pharyngitis, unspecified: Secondary | ICD-10-CM | POA: Diagnosis not present

## 2022-09-06 DIAGNOSIS — R059 Cough, unspecified: Secondary | ICD-10-CM | POA: Diagnosis not present

## 2022-09-06 DIAGNOSIS — Z03818 Encounter for observation for suspected exposure to other biological agents ruled out: Secondary | ICD-10-CM | POA: Diagnosis not present

## 2022-09-06 DIAGNOSIS — J069 Acute upper respiratory infection, unspecified: Secondary | ICD-10-CM | POA: Diagnosis not present

## 2022-09-06 LAB — FACTOR 5 LEIDEN

## 2022-09-08 ENCOUNTER — Encounter: Payer: Self-pay | Admitting: Hematology

## 2022-09-08 ENCOUNTER — Inpatient Hospital Stay (HOSPITAL_BASED_OUTPATIENT_CLINIC_OR_DEPARTMENT_OTHER): Payer: Medicare Other | Admitting: Hematology

## 2022-09-08 DIAGNOSIS — R778 Other specified abnormalities of plasma proteins: Secondary | ICD-10-CM | POA: Diagnosis not present

## 2022-09-08 DIAGNOSIS — Z86718 Personal history of other venous thrombosis and embolism: Secondary | ICD-10-CM | POA: Insufficient documentation

## 2022-09-08 NOTE — Assessment & Plan Note (Signed)
she had two episodes of DVT in her lower extremity, both were provoked.  -Patient is concerned about her future risk of thrombosis, she has a positive family history of thrombosis -She had a complete hypercoagulopathy workup in July 2024, which were all negative.  I reviewed the results with her

## 2022-09-08 NOTE — Progress Notes (Signed)
[facility]   [phone]) [phone] [phone]   Clinic Follow up Note   Patient Care Team: Jackelyn Poling, DO as PCP - General (Family Medicine)  Date of Service:  09/08/2022  I connected with Tamara Morgan on 09/08/2022 at  2:00 PM EDT by telephone visit and verified that I am speaking with the correct person using two identifiers.  I discussed the limitations, risks, security and privacy concerns of performing an evaluation and management service by telephone and the availability of in person appointments. I also discussed with the patient that there may be a patient responsible charge related to this service. The patient expressed understanding and agreed to proceed.   Other persons participating in the visit and their role in the encounter:  No  Patient's location:   home Provider's location:  [facility] office  CHIEF COMPLAINT: f/u of Abnormal SPEP   CURRENT THERAPY:    ASSESSMENT & PLAN:  Tamara Morgan is a 66 y.o. female with    Personal history of venous thrombosis and embolism she had two episodes of DVT in her lower extremity, both were provoked.  -Patient is concerned about her future risk of thrombosis, she has a positive family history of thrombosis -She had a complete hypercoagulopathy workup in July 2024, which were all negative.  I reviewed the results with her  Abnormal SPEP -She had lab workup by her rheumatologist for positive ANA, which were mainly negative.  SPEP was obtained during the workup, which showed bisalbuminemia, negative for M protein and total protein and gamma globulin levels were normal. -No evidence of MGUS or multiple myeloma.  I am not sure what the cause of her bisalbuminemia, the interpretation of the test is "This pattern may occur when  patients are taking high doses of Beta-lactamic anti-biotics (such as penicillin), suffering from pancreatic disease, or it may be the result of a rare genetic disorder, Hereditary  Bisalbuminemia.". pt has gallbladder stone and intermittent right upper quadrant pain after fatty meal.  -Repeated SPEP with immunofixation on August 26, 2022 was negative.  Light chain levels were also normal.  Lipase was normal.  No need to repeat or follow-up on this.  PLAN: -SPEP- normal -Light chain-normal -blood test-normal -f/u with PCP   INTERVAL HISTORY:  Tamara Morgan was contacted for a follow up of Abnormal SPEP . She was last seen by me on 08/19/2022.  Pt is recovering from a cold.   All other systems were reviewed with the patient and are negative.  MEDICAL HISTORY:  Past Medical History:  Diagnosis Date   Arrhythmia    Benign tumor    Cardiac anomaly    GERD (gastroesophageal reflux disease)    Hashimoto's disease    History of bone density study    Hyperlipidemia    Hypertension    Lyme disease    Malaria    OSA (obstructive sleep apnea)    Pap smear, as part of routine gynecological examination 2022    SURGICAL HISTORY: Past Surgical History:  Procedure Laterality Date   ABDOMINAL HYSTERECTOMY     CARDIAC CATHETERIZATION     COLONOSCOPY     several   ECTOPIC PREGNANCY SURGERY     OOPHORECTOMY Left    RECTOCELE REPAIR     VAGINAL HYSTERECTOMY      I have reviewed the social history and family history with the patient and they are unchanged from previous note.  ALLERGIES:  is allergic to tramadol, aspirin, betadine [povidone iodine], codeine, cortisone,  gluten meal, mushroom extract complex, onion, prednisone, premarin [conjugated estrogens], tine test [old tuberculin], tomato, valium [diazepam], versed [midazolam], wound dressing adhesive, and iodine.  MEDICATIONS:  Current Outpatient Medications  Medication Sig Dispense Refill   Acetylcysteine (NAC PO) Take 1,200 mg by mouth daily.     Azelastine HCl 137 MCG/SPRAY SOLN Place 1-2 sprays into the nose in the morning and at bedtime.     Brinzolamide-Brimonidine (SIMBRINZA) 1-0.2 % SUSP Apply 1 drop to  eye in the morning and at bedtime.     Calcium-Magnesium (CAL/MAG PO) Take by mouth.     Carboxymethylcellul-Glycerin (REFRESH TEARS PF OP) Apply to eye as needed.     cephALEXin (KEFLEX) 500 MG capsule Take 1 capsule (500 mg total) by mouth 3 (three) times daily. 21 capsule 0   cetirizine (ZYRTEC) 10 MG tablet Take 10 mg by mouth daily.     chlorhexidine (PERIDEX) 0.12 % solution Use as directed 15 mLs in the mouth or throat as needed.     Cholecalciferol (VITAMIN D-3 PO) Take by mouth.     clopidogrel (PLAVIX) 75 MG tablet Take 75 mg by mouth daily.     diphenhydrAMINE-PE-APAP (ALLERGY RELIEF PLUS SINUS PO) Take 50 mg by mouth at bedtime.     fluticasone (FLOVENT HFA) 110 MCG/ACT inhaler Inhale 2 puffs into the lungs as needed.     Ibuprofen-Acetaminophen (MOTRIN DUAL ACTION) 125-250 MG TABS Take 1 tablet by mouth as needed.     losartan (COZAAR) 25 MG tablet Take 25 mg by mouth in the morning and at bedtime.     Melatonin 10 MG TABS Take 20 mg by mouth at bedtime.     metoprolol succinate (TOPROL-XL) 25 MG 24 hr tablet Take 1 tablet (25 mg total) by mouth daily. 30 tablet 0   Misc Natural Products (TURMERIC CURCUMIN) CAPS Take 2,250 capsules by mouth daily.     Nutritional Supplements (COLD AND FLU PO) Take 2 tablets by mouth at bedtime. (Patient not taking: Reported on 07/15/2022)     pantoprazole (PROTONIX) 40 MG tablet Take 1 tablet (40 mg total) by mouth every evening. 30 tablet 0   Phenylephrine-Acetaminophen (TYLENOL SINUS CONGESTION/PAIN PO) Take 1 tablet by mouth 2 (two) times daily.     Povidone, PF, (IVIZIA DRY EYES) 0.5 % SOLN Apply 2 drops to eye in the morning and at bedtime.     prednisoLONE Acetate-Nepafenac 1-0.1 % SUSP Apply 1 drop to eye daily. Right eye     sodium chloride (MURO 128) 5 % ophthalmic ointment Place 1 Application into both eyes at bedtime.     No current facility-administered medications for this visit.    PHYSICAL EXAMINATION: ECOG PERFORMANCE STATUS: 1 -  Symptomatic but completely ambulatory  There were no vitals filed for this visit. Wt Readings from Last 3 Encounters:  08/19/22 271 lb 3.2 oz (123 kg)  07/15/22 266 lb (120.7 kg)  06/17/22 265 lb (120.2 kg)     No vitals taken today, Exam not performed today  LABORATORY DATA:  I have reviewed the data as listed    Latest Ref Rng & Units 03/25/2022   11:52 AM 10/17/2021   10:40 PM 08/11/2020    2:49 PM  CBC  WBC 3.8 - 10.8 Thousand/uL 6.0  6.9  6.9   Hemoglobin 11.7 - 15.5 g/dL 09.8  11.9  14.7   Hematocrit 35.0 - 45.0 % 41.0  39.2  42.8   Platelets 140 - 400 Thousand/uL 267  251  267  Latest Ref Rng & Units 07/06/2022    3:17 PM 03/25/2022   11:52 AM 10/17/2021   10:40 PM  CMP  Glucose 65 - 99 mg/dL  161  096   BUN 7 - 25 mg/dL  15  13   Creatinine 0.45 - 1.05 mg/dL  4.09  8.11   Sodium 914 - 146 mmol/L  142  142   Potassium 3.5 - 5.3 mmol/L  4.3  3.9   Chloride 98 - 110 mmol/L  106  109   CO2 20 - 32 mmol/L  25  27   Calcium 8.6 - 10.4 mg/dL  9.7  9.0   Total Protein 6.1 - 8.1 g/dL 7.0  7.2  6.9   Total Bilirubin 0.2 - 1.2 mg/dL  0.4  0.4   Alkaline Phos 38 - 126 U/L   71   AST 10 - 35 U/L  23  25   ALT 6 - 29 U/L  28  25       RADIOGRAPHIC STUDIES: I have personally reviewed the radiological images as listed and agreed with the findings in the report. No results found.    No orders of the defined types were placed in this encounter.  All questions were answered. The patient knows to call the clinic with any problems, questions or concerns. No barriers to learning was detected. The total time spent in the appointment was 12 minutes.     Malachy Mood, MD 09/08/2022   Carolin Coy am acting as scribe for Malachy Mood, MD.   I have reviewed the above documentation for accuracy and completeness, and I agree with the above.

## 2022-09-08 NOTE — Assessment & Plan Note (Signed)
-  She had lab workup by her rheumatologist for positive ANA, which were mainly negative.  SPEP was obtained during the workup, which showed bisalbuminemia, negative for M protein and total protein and gamma globulin levels were normal. -No evidence of MGUS or multiple myeloma.  I am not sure what the cause of her bisalbuminemia, the interpretation of the test is "This pattern may occur when  patients are taking high doses of Beta-lactamic anti-biotics (such as penicillin), suffering from pancreatic disease, or it may be the result of a rare genetic disorder, Hereditary Bisalbuminemia.". pt has gallbladder stone and intermittent right upper quadrant pain after fatty meal.  -Repeated SPEP with immunofixation on August 26, 2022 was negative.  Light chain levels were also normal.  Lipase was normal.  No need to repeat or follow-up on this.

## 2022-09-09 DIAGNOSIS — H40051 Ocular hypertension, right eye: Secondary | ICD-10-CM | POA: Diagnosis not present

## 2022-09-09 DIAGNOSIS — H401111 Primary open-angle glaucoma, right eye, mild stage: Secondary | ICD-10-CM | POA: Diagnosis not present

## 2022-09-16 DIAGNOSIS — R0981 Nasal congestion: Secondary | ICD-10-CM | POA: Diagnosis not present

## 2022-09-20 DIAGNOSIS — Z1211 Encounter for screening for malignant neoplasm of colon: Secondary | ICD-10-CM | POA: Diagnosis not present

## 2022-09-20 DIAGNOSIS — K219 Gastro-esophageal reflux disease without esophagitis: Secondary | ICD-10-CM | POA: Diagnosis not present

## 2022-10-05 DIAGNOSIS — E063 Autoimmune thyroiditis: Secondary | ICD-10-CM | POA: Diagnosis not present

## 2022-10-05 DIAGNOSIS — I4891 Unspecified atrial fibrillation: Secondary | ICD-10-CM | POA: Diagnosis not present

## 2022-10-05 DIAGNOSIS — E215 Disorder of parathyroid gland, unspecified: Secondary | ICD-10-CM | POA: Diagnosis not present

## 2022-10-05 DIAGNOSIS — J069 Acute upper respiratory infection, unspecified: Secondary | ICD-10-CM | POA: Diagnosis not present

## 2022-10-06 ENCOUNTER — Other Ambulatory Visit: Payer: Self-pay | Admitting: Family Medicine

## 2022-10-06 DIAGNOSIS — E215 Disorder of parathyroid gland, unspecified: Secondary | ICD-10-CM

## 2022-10-11 DIAGNOSIS — H401131 Primary open-angle glaucoma, bilateral, mild stage: Secondary | ICD-10-CM | POA: Diagnosis not present

## 2022-10-11 DIAGNOSIS — H5711 Ocular pain, right eye: Secondary | ICD-10-CM | POA: Diagnosis not present

## 2022-10-11 DIAGNOSIS — H20011 Primary iridocyclitis, right eye: Secondary | ICD-10-CM | POA: Diagnosis not present

## 2022-10-12 DIAGNOSIS — H40051 Ocular hypertension, right eye: Secondary | ICD-10-CM | POA: Diagnosis not present

## 2022-10-25 DIAGNOSIS — H40033 Anatomical narrow angle, bilateral: Secondary | ICD-10-CM | POA: Diagnosis not present

## 2022-10-25 DIAGNOSIS — R052 Subacute cough: Secondary | ICD-10-CM | POA: Diagnosis not present

## 2022-10-25 DIAGNOSIS — H402212 Chronic angle-closure glaucoma, right eye, moderate stage: Secondary | ICD-10-CM | POA: Diagnosis not present

## 2022-10-25 DIAGNOSIS — H2513 Age-related nuclear cataract, bilateral: Secondary | ICD-10-CM | POA: Diagnosis not present

## 2022-11-01 ENCOUNTER — Ambulatory Visit
Admission: RE | Admit: 2022-11-01 | Discharge: 2022-11-01 | Disposition: A | Discharge status: Home / Self Care | Payer: Medicare Other | Source: Ambulatory Visit | Attending: Family Medicine | Admitting: Family Medicine

## 2022-11-01 DIAGNOSIS — E215 Disorder of parathyroid gland, unspecified: Secondary | ICD-10-CM

## 2022-11-07 DIAGNOSIS — G4733 Obstructive sleep apnea (adult) (pediatric): Secondary | ICD-10-CM | POA: Diagnosis not present

## 2022-11-08 DIAGNOSIS — H2513 Age-related nuclear cataract, bilateral: Secondary | ICD-10-CM | POA: Diagnosis not present

## 2022-11-11 ENCOUNTER — Ambulatory Visit: Payer: Medicare Other | Admitting: Podiatry

## 2022-11-11 ENCOUNTER — Ambulatory Visit (INDEPENDENT_AMBULATORY_CARE_PROVIDER_SITE_OTHER): Payer: Medicare Other

## 2022-11-11 ENCOUNTER — Encounter: Payer: Self-pay | Admitting: Podiatry

## 2022-11-11 DIAGNOSIS — M9261 Juvenile osteochondrosis of tarsus, right ankle: Secondary | ICD-10-CM

## 2022-11-11 DIAGNOSIS — M6740 Ganglion, unspecified site: Secondary | ICD-10-CM

## 2022-11-11 DIAGNOSIS — M898X7 Other specified disorders of bone, ankle and foot: Secondary | ICD-10-CM | POA: Diagnosis not present

## 2022-11-11 DIAGNOSIS — Z01818 Encounter for other preprocedural examination: Secondary | ICD-10-CM | POA: Diagnosis not present

## 2022-11-12 NOTE — Progress Notes (Signed)
Subjective:  Patient ID: Tamara Morgan, female    DOB: 06-03-1955,  MRN: [id number]  Chief Complaint  Patient presents with   Foot Pain    follow up to discuss surgery/ right 2nd toe gotten worse     67 y.o. female presents with concern for right second toe pain due to a lesion on the toe.  She also has significant pain in the back of her right heel and the bump that causes pain with any pressure on it at the back of the heel on the right foot.  She has tried multiple conservative therapies for the right second toe and back of the heel including stretching icing offloading and gel toe cap for the second toe as well as anti-inflammatories and NSAIDs for the right heel as well as shoe gear changes and modifications/heel lifts but nothing is working.  She is hoping to discuss surgery for the second toe as well as the heel issue.  Past Medical History:  Diagnosis Date   Arrhythmia    Benign tumor    Cardiac anomaly    GERD (gastroesophageal reflux disease)    Hashimoto's disease    History of bone density study    Hyperlipidemia    Hypertension    Lyme disease    Malaria    OSA (obstructive sleep apnea)    Pap smear, as part of routine gynecological examination 2022    Allergies  Allergen Reactions   Tramadol Other (See Comments)    Hyperanxiety  Panic attacks    Aspirin     Edema    Betadine [Povidone Iodine] Hives   Codeine     Central nervous system    Cortisone     Unknown   Gluten Meal     unknown   Mushroom Extract Complex     Fungi infection    Onion     Reflux   Prednisone     oral   Premarin [Conjugated Estrogens] Hives    Oral premarin    Tine Test [Old Tuberculin] Swelling   Tomato     Reflux   Valium [Diazepam] Nausea Only    Stop breathing    Versed [Midazolam] Nausea And Vomiting    Out of body experience    Wound Dressing Adhesive Hives   Iodine Rash    ROS: Negative except as per HPI above  Objective:  General: AAO x3,  NAD  Dermatological: Lesion present to the dorsal medial aspect of the second toe distal interphalangeal joint.  Lesion is fluid-filled consistent with a mucoid cyst.  Vascular:  Dorsalis Pedis artery and Posterior Tibial artery pedal pulses are 2/4 bilateral.  Capillary fill time < 3 sec to all digits.   Neruologic: Grossly intact via light touch bilateral. Protective threshold intact to all sites bilateral.   Musculoskeletal: Pain with palpation about the second toe distal interphalangeal joint.  Pain with palpation of the posterior aspect of the right heel with palpable osseous prominence noted at the posterior aspect of the heel.  Pain is increased with ankle dorsiflexion range of motion.  Gait: Unassisted, Nonantalgic.   No images are attached to the encounter.  Radiographs:  Date: 11/11/2022 XR the right foot Weightbearing AP/Lateral/Oblique   Findings: Haglund's deformity is noted at the posterior aspect of the heel with small intratendinous calcification noted just proximal to the Achilles tendon insertion point.  Second toe with evidence of arthritic changes in the distal and proximal interphalangeal joints. Assessment:   1. Haglund's deformity of  right heel   2. Mucoid cyst of joint   3. Retrocalcaneal exostosis   4. Pre-op exam      Plan:  Patient was evaluated and treated and all questions answered.  # Achilles tendinitis and Haglund's deformity right heel -Patient has failed conservative therapies including stretching icing anti-inflammatories as well as shoe gear modifications -Hoping to proceed with surgical intervention to include resection of the painful bump on the back of the right heel -I would recommend retrocalcaneal exostectomy with resection of prominent bone as well as debridement of any intra tendinous calcifications and debridement of the Achilles tendon with secondary repair -Discussed the risk benefits alternatives and possible complications associated  with this procedure as well as the expected postoperative recovery course.  Patient was to proceed  # Mucoid cyst of right second toe distal interphalange joint -Painful mucoid cyst patient has previously ruptured it herself and has recurred -Patient has failed conservative therapies including toe caps and offloading -Recommend excision of the mucoid cyst with distal interphalange joint arthroplasty to prevent recurrence -Explained the risk benefits alternatives and possible complications as well as recovery course for this procedure -Patient wishes to proceed.  Informed surgical consent was obtained.  Will begin surgical planning.         Corinna Gab, DPM [facility] / [facility]

## 2022-11-21 DIAGNOSIS — H402293 Chronic angle-closure glaucoma, unspecified eye, severe stage: Secondary | ICD-10-CM | POA: Diagnosis not present

## 2022-11-21 DIAGNOSIS — H402213 Chronic angle-closure glaucoma, right eye, severe stage: Secondary | ICD-10-CM | POA: Diagnosis not present

## 2022-11-21 DIAGNOSIS — H2511 Age-related nuclear cataract, right eye: Secondary | ICD-10-CM | POA: Diagnosis not present

## 2022-11-22 DIAGNOSIS — Z9841 Cataract extraction status, right eye: Secondary | ICD-10-CM | POA: Diagnosis not present

## 2022-11-22 DIAGNOSIS — H2512 Age-related nuclear cataract, left eye: Secondary | ICD-10-CM | POA: Diagnosis not present

## 2022-11-22 DIAGNOSIS — Z961 Presence of intraocular lens: Secondary | ICD-10-CM | POA: Diagnosis not present

## 2022-11-22 DIAGNOSIS — H402212 Chronic angle-closure glaucoma, right eye, moderate stage: Secondary | ICD-10-CM | POA: Diagnosis not present

## 2022-11-22 DIAGNOSIS — H40032 Anatomical narrow angle, left eye: Secondary | ICD-10-CM | POA: Diagnosis not present

## 2022-11-23 DIAGNOSIS — I10 Essential (primary) hypertension: Secondary | ICD-10-CM | POA: Diagnosis not present

## 2022-11-23 DIAGNOSIS — Z86718 Personal history of other venous thrombosis and embolism: Secondary | ICD-10-CM | POA: Diagnosis not present

## 2022-11-28 DIAGNOSIS — Z961 Presence of intraocular lens: Secondary | ICD-10-CM | POA: Diagnosis not present

## 2022-11-28 DIAGNOSIS — H402212 Chronic angle-closure glaucoma, right eye, moderate stage: Secondary | ICD-10-CM | POA: Diagnosis not present

## 2022-11-28 DIAGNOSIS — Z9841 Cataract extraction status, right eye: Secondary | ICD-10-CM | POA: Diagnosis not present

## 2022-11-28 DIAGNOSIS — H40032 Anatomical narrow angle, left eye: Secondary | ICD-10-CM | POA: Diagnosis not present

## 2022-11-28 DIAGNOSIS — H2512 Age-related nuclear cataract, left eye: Secondary | ICD-10-CM | POA: Diagnosis not present

## 2022-11-30 DIAGNOSIS — R051 Acute cough: Secondary | ICD-10-CM | POA: Diagnosis not present

## 2022-12-04 ENCOUNTER — Emergency Department (HOSPITAL_BASED_OUTPATIENT_CLINIC_OR_DEPARTMENT_OTHER)
Admission: EM | Admit: 2022-12-04 | Discharge: 2022-12-04 | Disposition: A | Discharge status: Home / Self Care | Payer: Medicare Other | Attending: Emergency Medicine | Admitting: Emergency Medicine

## 2022-12-04 ENCOUNTER — Other Ambulatory Visit: Payer: Self-pay

## 2022-12-04 ENCOUNTER — Encounter (HOSPITAL_BASED_OUTPATIENT_CLINIC_OR_DEPARTMENT_OTHER): Payer: Self-pay | Admitting: Emergency Medicine

## 2022-12-04 ENCOUNTER — Emergency Department (HOSPITAL_BASED_OUTPATIENT_CLINIC_OR_DEPARTMENT_OTHER): Payer: Medicare Other

## 2022-12-04 DIAGNOSIS — I700 Atherosclerosis of aorta: Secondary | ICD-10-CM | POA: Diagnosis not present

## 2022-12-04 DIAGNOSIS — J40 Bronchitis, not specified as acute or chronic: Secondary | ICD-10-CM | POA: Diagnosis not present

## 2022-12-04 DIAGNOSIS — Z7902 Long term (current) use of antithrombotics/antiplatelets: Secondary | ICD-10-CM | POA: Diagnosis not present

## 2022-12-04 DIAGNOSIS — R600 Localized edema: Secondary | ICD-10-CM | POA: Diagnosis not present

## 2022-12-04 DIAGNOSIS — R0602 Shortness of breath: Secondary | ICD-10-CM | POA: Insufficient documentation

## 2022-12-04 DIAGNOSIS — Z1152 Encounter for screening for COVID-19: Secondary | ICD-10-CM | POA: Insufficient documentation

## 2022-12-04 DIAGNOSIS — R059 Cough, unspecified: Secondary | ICD-10-CM | POA: Diagnosis not present

## 2022-12-04 LAB — CBC
HCT: 39.6 % (ref 36.0–46.0)
Hemoglobin: 12.9 g/dL (ref 12.0–15.0)
MCH: 29.3 pg (ref 26.0–34.0)
MCHC: 32.6 g/dL (ref 30.0–36.0)
MCV: 90 fL (ref 80.0–100.0)
Platelets: 262 10*3/uL (ref 150–400)
RBC: 4.4 MIL/uL (ref 3.87–5.11)
RDW: 14.7 % (ref 11.5–15.5)
WBC: 8.4 10*3/uL (ref 4.0–10.5)
nRBC: 0 % (ref 0.0–0.2)

## 2022-12-04 LAB — BASIC METABOLIC PANEL
Anion gap: 10 (ref 5–15)
BUN: 14 mg/dL (ref 8–23)
CO2: 26 mmol/L (ref 22–32)
Calcium: 9.1 mg/dL (ref 8.9–10.3)
Chloride: 102 mmol/L (ref 98–111)
Creatinine, Ser: 0.93 mg/dL (ref 0.44–1.00)
GFR, Estimated: >60 mL/min (ref >60)
Glucose, Bld: 123 mg/dL — ABNORMAL HIGH (ref 70–99)
Potassium: 3.9 mmol/L (ref 3.5–5.1)
Sodium: 138 mmol/L (ref 135–145)

## 2022-12-04 LAB — BRAIN NATRIURETIC PEPTIDE: B Natriuretic Peptide: 28.8 pg/mL (ref 0.0–100.0)

## 2022-12-04 LAB — RESP PANEL BY RT-PCR (RSV, FLU A&B, COVID)  RVPGX2
Influenza A by PCR: NEGATIVE
Influenza B by PCR: NEGATIVE
Resp Syncytial Virus by PCR: NEGATIVE
SARS Coronavirus 2 by RT PCR: NEGATIVE

## 2022-12-04 MED ORDER — DOXYCYCLINE HYCLATE 100 MG PO CAPS: 100.0000 mg | ORAL_CAPSULE | Freq: Two times a day (BID) | ORAL | 0 refills | Status: DC

## 2022-12-04 MED ORDER — GUAIFENESIN-DM 100-10 MG/5ML PO SYRP: 5.0000 mL | ORAL_SOLUTION | Freq: Three times a day (TID) | ORAL | 0 refills | Status: AC | PRN

## 2022-12-04 NOTE — ED Provider Notes (Signed)
[place] [facility] AT [facility] Provider Note   CSN: [id number] Arrival date & time: 12/04/22  1900     History  Chief Complaint  Patient presents with   Cough    Tamara Morgan is a 67 y.o. female.   Cough Patient presents with cough.  Has had for the last few months.  Congestion worse over the last week.  More production. No fevers.  Denies history of COPD.  States she had some doxycycline about 4 months ago.  Cough medicines have not helped much.  Been more fatigued.  Has swelling in her legs also.     Home Medications Prior to Admission medications   Medication Sig Start Date End Date Taking? Authorizing Provider  doxycycline (VIBRAMYCIN) 100 MG capsule Take 1 capsule (100 mg total) by mouth 2 (two) times daily. 12/04/22  Yes Benjiman Core, MD  guaiFENesin-dextromethorphan (ROBITUSSIN DM) 100-10 MG/5ML syrup Take 5 mLs by mouth 3 (three) times daily as needed for cough. 12/04/22  Yes Benjiman Core, MD  Acetylcysteine (NAC PO) Take 1,200 mg by mouth daily.    [provider]  Azelastine HCl 137 MCG/SPRAY SOLN Place 1-2 sprays into the nose in the morning and at bedtime.    [provider]  Brinzolamide-Brimonidine (SIMBRINZA) 1-0.2 % SUSP Apply 1 drop to eye in the morning and at bedtime.    [provider]  Calcium-Magnesium (CAL/MAG PO) Take by mouth.    [provider]  Carboxymethylcellul-Glycerin (REFRESH TEARS PF OP) Apply to eye as needed.    [provider]  cephALEXin (KEFLEX) 500 MG capsule Take 1 capsule (500 mg total) by mouth 3 (three) times daily. 08/06/22   Vivi Barrack, DPM  cetirizine (ZYRTEC) 10 MG tablet Take 10 mg by mouth daily.    [provider]  chlorhexidine (PERIDEX) 0.12 % solution Use as directed 15 mLs in the mouth or throat as needed.    [provider]  Cholecalciferol (VITAMIN D-3 PO) Take by mouth.    [provider]  clopidogrel  (PLAVIX) 75 MG tablet Take 75 mg by mouth daily.    [provider]  diphenhydrAMINE-PE-APAP (ALLERGY RELIEF PLUS SINUS PO) Take 50 mg by mouth at bedtime.    [provider]  fluticasone (FLOVENT HFA) 110 MCG/ACT inhaler Inhale 2 puffs into the lungs as needed.    [provider]  Ibuprofen-Acetaminophen (MOTRIN DUAL ACTION) 125-250 MG TABS Take 1 tablet by mouth as needed.    [provider]  losartan (COZAAR) 25 MG tablet Take 25 mg by mouth in the morning and at bedtime.    [provider]  Melatonin 10 MG TABS Take 20 mg by mouth at bedtime.    [provider]  metoprolol succinate (TOPROL-XL) 25 MG 24 hr tablet Take 1 tablet (25 mg total) by mouth daily. 08/11/20 07/15/22  Gerhard Munch, MD  Misc Natural Products (TURMERIC CURCUMIN) CAPS Take 2,250 capsules by mouth daily.    [provider]  Nutritional Supplements (COLD AND FLU PO) Take 2 tablets by mouth at bedtime.    [provider]  pantoprazole (PROTONIX) 40 MG tablet Take 1 tablet (40 mg total) by mouth every evening. 06/28/22   Medina-Vargas, Monina C, NP  Phenylephrine-Acetaminophen (TYLENOL SINUS CONGESTION/PAIN PO) Take 1 tablet by mouth 2 (two) times daily.    [provider]  Povidone, PF, (IVIZIA DRY EYES) 0.5 % SOLN Apply 2 drops to eye in the morning and at bedtime.  [provider]  prednisoLONE Acetate-Nepafenac 1-0.1 % SUSP Apply 1 drop to eye daily. Right eye    [provider]  sodium chloride (MURO 128) 5 % ophthalmic ointment Place 1 Application into both eyes at bedtime.    [provider]      Allergies    Tramadol, Aspirin, Betadine [povidone iodine], Codeine, Cortisone, Gluten meal, Mushroom extract complex, Onion, Prednisone, Premarin [conjugated estrogens], Tine test [old tuberculin], Tomato, Valium [diazepam], Versed [midazolam], Wound dressing adhesive, and Iodine    Review of Systems   Review of  Systems  Respiratory:  Positive for cough.     Physical Exam Updated Vital Signs BP (!) 128/49   Pulse 70   Temp 97.8 F (36.6 C) (Oral)   Resp 15   Ht 5\' 8"  (1.727 m)   Wt 119.3 kg   SpO2 96%   BMI 39.99 kg/m  Physical Exam Vitals and nursing note reviewed.  HENT:     Head: Normocephalic.  Cardiovascular:     Rate and Rhythm: Regular rhythm.  Pulmonary:     Comments: Somewhat harsh breath sounds without focal rales or rhonchi. Abdominal:     Tenderness: There is no abdominal tenderness.  Musculoskeletal:     Right lower leg: Edema present.     Left lower leg: Edema present.     ED Results / Procedures / Treatments   Labs (all labs ordered are listed, but only abnormal results are displayed) Labs Reviewed  BASIC METABOLIC PANEL - Abnormal; Notable for the following components:      Result Value   Glucose, Bld 123 (*)    All other components within normal limits  RESP PANEL BY RT-PCR (RSV, FLU A&B, COVID)  RVPGX2  BORDETELLA PERTUSSIS PCR  BRAIN NATRIURETIC PEPTIDE  CBC    EKG None  Radiology DG Chest 2 View  Result Date: 12/04/2022 CLINICAL DATA:  cough EXAM: CHEST - 2 VIEW COMPARISON:  Chest x-ray 11/12/2021 FINDINGS: The heart and mediastinal contours are within normal limits. Atherosclerotic plaque. No focal consolidation. No pulmonary edema. No pleural effusion. No pneumothorax. No acute osseous abnormality. IMPRESSION: 1. No active cardiopulmonary disease. 2.  Aortic Atherosclerosis (ICD10-I70.0). Electronically Signed   By: Tish Frederickson M.D.   On: 12/04/2022 21:04    Procedures Procedures    Medications Ordered in ED Medications - No data to display  ED Course/ Medical Decision Making/ A&P                                 Medical Decision Making Amount and/or Complexity of Data Reviewed Labs: ordered. Radiology: ordered.  Risk OTC drugs. Prescription drug management.   Patient shortness of breath and cough.  Worsening.  Has been  there for a month but now more nasal congestion.  Potentially viral or cause such as puberty but has had increased sputum production.  States she will cough to the point she cannot breathe.  Pertussis testing done but not resulted.  X-ray does not show pneumonia.  Blood work including BNP done and overall reassuring.  Will give cough medicine.  I think antibiotics are reasonable at this point with increasing sputum production.  Will need to follow-up with PCP.  Will discharge home.        Final Clinical Impression(s) / ED Diagnoses Final diagnoses:  Bronchitis    Rx / DC Orders ED Discharge Orders  Ordered    doxycycline (VIBRAMYCIN) 100 MG capsule  2 times daily        12/04/22 2312    guaiFENesin-dextromethorphan (ROBITUSSIN DM) 100-10 MG/5ML syrup  3 times daily PRN        12/04/22 2312              Benjiman Core, MD 12/04/22 2318

## 2022-12-04 NOTE — Discharge Instructions (Signed)
Take the back and the cough medicine as needed.  Follow-up with your doctor.  A pertussis test has been done but not resulted while you are here.

## 2022-12-04 NOTE — ED Triage Notes (Signed)
Pt reports cough since August; developed congestion Tues; sts OTC cough meds not helping

## 2022-12-04 NOTE — ED Notes (Addendum)
..  The patient is A&OX4, ambulatory at d/c with independent steady gait, NAD. Pt verbalized understanding of d/c instructions, prescriptions and follow up care.  

## 2022-12-07 LAB — BORDETELLA PERTUSSIS PCR
B parapertussis, DNA: NEGATIVE
B pertussis, DNA: NEGATIVE

## 2022-12-08 DIAGNOSIS — H9193 Unspecified hearing loss, bilateral: Secondary | ICD-10-CM | POA: Diagnosis not present

## 2022-12-08 DIAGNOSIS — H9313 Tinnitus, bilateral: Secondary | ICD-10-CM | POA: Diagnosis not present

## 2022-12-08 DIAGNOSIS — Z01118 Encounter for examination of ears and hearing with other abnormal findings: Secondary | ICD-10-CM | POA: Diagnosis not present

## 2022-12-08 DIAGNOSIS — Z0000 Encounter for general adult medical examination without abnormal findings: Secondary | ICD-10-CM | POA: Diagnosis not present

## 2022-12-08 DIAGNOSIS — H903 Sensorineural hearing loss, bilateral: Secondary | ICD-10-CM | POA: Diagnosis not present

## 2022-12-19 DIAGNOSIS — H2512 Age-related nuclear cataract, left eye: Secondary | ICD-10-CM | POA: Diagnosis not present

## 2022-12-19 DIAGNOSIS — I10 Essential (primary) hypertension: Secondary | ICD-10-CM | POA: Diagnosis not present

## 2022-12-19 DIAGNOSIS — Z79899 Other long term (current) drug therapy: Secondary | ICD-10-CM | POA: Diagnosis not present

## 2022-12-19 DIAGNOSIS — H40032 Anatomical narrow angle, left eye: Secondary | ICD-10-CM | POA: Diagnosis not present

## 2022-12-19 DIAGNOSIS — E039 Hypothyroidism, unspecified: Secondary | ICD-10-CM | POA: Diagnosis not present

## 2022-12-19 DIAGNOSIS — Z7901 Long term (current) use of anticoagulants: Secondary | ICD-10-CM | POA: Diagnosis not present

## 2022-12-19 DIAGNOSIS — G4733 Obstructive sleep apnea (adult) (pediatric): Secondary | ICD-10-CM | POA: Diagnosis not present

## 2022-12-20 DIAGNOSIS — Z9841 Cataract extraction status, right eye: Secondary | ICD-10-CM | POA: Diagnosis not present

## 2022-12-20 DIAGNOSIS — H402212 Chronic angle-closure glaucoma, right eye, moderate stage: Secondary | ICD-10-CM | POA: Diagnosis not present

## 2022-12-20 DIAGNOSIS — Z961 Presence of intraocular lens: Secondary | ICD-10-CM | POA: Diagnosis not present

## 2022-12-20 DIAGNOSIS — H40032 Anatomical narrow angle, left eye: Secondary | ICD-10-CM | POA: Diagnosis not present

## 2022-12-20 DIAGNOSIS — Z9842 Cataract extraction status, left eye: Secondary | ICD-10-CM | POA: Diagnosis not present

## 2022-12-24 DIAGNOSIS — H903 Sensorineural hearing loss, bilateral: Secondary | ICD-10-CM | POA: Diagnosis not present

## 2022-12-24 DIAGNOSIS — H9313 Tinnitus, bilateral: Secondary | ICD-10-CM | POA: Diagnosis not present

## 2022-12-26 DIAGNOSIS — I4891 Unspecified atrial fibrillation: Secondary | ICD-10-CM | POA: Diagnosis not present

## 2022-12-26 DIAGNOSIS — Z8639 Personal history of other endocrine, nutritional and metabolic disease: Secondary | ICD-10-CM | POA: Diagnosis not present

## 2022-12-26 DIAGNOSIS — R0789 Other chest pain: Secondary | ICD-10-CM | POA: Diagnosis not present

## 2022-12-26 DIAGNOSIS — I10 Essential (primary) hypertension: Secondary | ICD-10-CM | POA: Diagnosis not present

## 2022-12-29 DIAGNOSIS — H40032 Anatomical narrow angle, left eye: Secondary | ICD-10-CM | POA: Diagnosis not present

## 2022-12-29 DIAGNOSIS — Z9842 Cataract extraction status, left eye: Secondary | ICD-10-CM | POA: Diagnosis not present

## 2022-12-29 DIAGNOSIS — H402212 Chronic angle-closure glaucoma, right eye, moderate stage: Secondary | ICD-10-CM | POA: Diagnosis not present

## 2022-12-29 DIAGNOSIS — Z9841 Cataract extraction status, right eye: Secondary | ICD-10-CM | POA: Diagnosis not present

## 2022-12-29 DIAGNOSIS — Z961 Presence of intraocular lens: Secondary | ICD-10-CM | POA: Diagnosis not present

## 2022-12-30 DIAGNOSIS — K080 Exfoliation of teeth due to systemic causes: Secondary | ICD-10-CM | POA: Diagnosis not present

## 2023-01-02 DIAGNOSIS — G4733 Obstructive sleep apnea (adult) (pediatric): Secondary | ICD-10-CM | POA: Diagnosis not present

## 2023-01-02 DIAGNOSIS — I480 Paroxysmal atrial fibrillation: Secondary | ICD-10-CM | POA: Diagnosis not present

## 2023-01-05 DIAGNOSIS — I471 Supraventricular tachycardia, unspecified: Secondary | ICD-10-CM | POA: Diagnosis not present

## 2023-01-05 DIAGNOSIS — R002 Palpitations: Secondary | ICD-10-CM | POA: Diagnosis not present

## 2023-01-05 DIAGNOSIS — I10 Essential (primary) hypertension: Secondary | ICD-10-CM | POA: Diagnosis not present

## 2023-01-05 DIAGNOSIS — I480 Paroxysmal atrial fibrillation: Secondary | ICD-10-CM | POA: Diagnosis not present

## 2023-01-11 ENCOUNTER — Encounter: Payer: Self-pay | Admitting: Podiatry

## 2023-01-11 ENCOUNTER — Telehealth: Payer: Self-pay | Admitting: Podiatry

## 2023-01-11 NOTE — Telephone Encounter (Signed)
DOS-02/01/2023  CALCANEAL OSTECTOMY [id number]  EXC. GANGLION [id number]  REPAIR POST TENDON [id number]  [employer] EFFECTIVE DATE- 02/14/2022  DEDUCTIBLE-$0.00 WITH REMAINING $0.00 OOP-$3500.00 WITH REMAINING $2119.34   SPOKE WITH MICHELLE H. FROM [employer] AND SHE STATED THAT PRIOR AUTH IS NOT REQUIRED FOR CPT CODES [id number] AND [id number].  CALL REF #: [id number] 01/11/2023

## 2023-01-18 DIAGNOSIS — K080 Exfoliation of teeth due to systemic causes: Secondary | ICD-10-CM | POA: Diagnosis not present

## 2023-01-23 ENCOUNTER — Telehealth: Payer: Self-pay | Admitting: Urology

## 2023-01-23 NOTE — Telephone Encounter (Signed)
Pt called wanting to know when she needs to stop her Blood thinner, she saw her cardiologist on 01/05/23 Dr. Houston Siren (Cardiologist with [facility]) and per patient, doctor stated "it was up to you". Please Advise.

## 2023-01-26 DIAGNOSIS — M26609 Unspecified temporomandibular joint disorder, unspecified side: Secondary | ICD-10-CM | POA: Diagnosis not present

## 2023-02-01 ENCOUNTER — Other Ambulatory Visit (INDEPENDENT_AMBULATORY_CARE_PROVIDER_SITE_OTHER): Payer: Medicare Other | Admitting: Podiatry

## 2023-02-01 ENCOUNTER — Telehealth: Payer: Self-pay | Admitting: Urology

## 2023-02-01 ENCOUNTER — Encounter: Payer: Self-pay | Admitting: Podiatry

## 2023-02-01 ENCOUNTER — Other Ambulatory Visit: Payer: Self-pay | Admitting: Podiatry

## 2023-02-01 DIAGNOSIS — M7731 Calcaneal spur, right foot: Secondary | ICD-10-CM | POA: Diagnosis not present

## 2023-02-01 DIAGNOSIS — M71371 Other bursal cyst, right ankle and foot: Secondary | ICD-10-CM | POA: Diagnosis not present

## 2023-02-01 DIAGNOSIS — M7661 Achilles tendinitis, right leg: Secondary | ICD-10-CM | POA: Diagnosis not present

## 2023-02-01 DIAGNOSIS — M67471 Ganglion, right ankle and foot: Secondary | ICD-10-CM | POA: Diagnosis not present

## 2023-02-01 DIAGNOSIS — G8918 Other acute postprocedural pain: Secondary | ICD-10-CM | POA: Diagnosis not present

## 2023-02-01 DIAGNOSIS — D492 Neoplasm of unspecified behavior of bone, soft tissue, and skin: Secondary | ICD-10-CM | POA: Diagnosis not present

## 2023-02-01 MED ORDER — APIXABAN 2.5 MG PO TABS: 2.5000 mg | ORAL_TABLET | Freq: Two times a day (BID) | ORAL | 0 refills | Status: DC

## 2023-02-01 MED ORDER — OXYCODONE-ACETAMINOPHEN 5-325 MG PO TABS: 1.0000 | ORAL_TABLET | ORAL | 0 refills | Status: AC | PRN

## 2023-02-01 MED ORDER — IBUPROFEN 800 MG PO TABS: 800.0000 mg | ORAL_TABLET | Freq: Three times a day (TID) | ORAL | 1 refills | Status: DC | PRN

## 2023-02-01 MED ORDER — CEPHALEXIN 500 MG PO CAPS: 500.0000 mg | ORAL_CAPSULE | Freq: Three times a day (TID) | ORAL | 0 refills | Status: DC

## 2023-02-01 MED ORDER — CLINDAMYCIN HCL 300 MG PO CAPS: 300.0000 mg | ORAL_CAPSULE | Freq: Three times a day (TID) | ORAL | 0 refills | Status: AC

## 2023-02-01 NOTE — Telephone Encounter (Signed)
CALLED AND SPOKE WITH Chrissie Noa WITH [employer] REGARDING CPT CODE 62952, HE STATED THAT THERE IS NO PRIOR AUTH REQUIRED FOR CPT CODE 84132.  CALL REF # [employer] ID #, 02/01/23 AT 12:15 PM EST

## 2023-02-01 NOTE — Progress Notes (Signed)
Rx for clindamycin sent.

## 2023-02-01 NOTE — Progress Notes (Signed)
Post-op meds sent

## 2023-02-03 ENCOUNTER — Telehealth: Payer: Self-pay | Admitting: Podiatry

## 2023-02-03 ENCOUNTER — Emergency Department (HOSPITAL_COMMUNITY): Payer: Medicare Other

## 2023-02-03 ENCOUNTER — Encounter (HOSPITAL_COMMUNITY): Payer: Self-pay

## 2023-02-03 ENCOUNTER — Emergency Department (HOSPITAL_COMMUNITY)
Admission: EM | Admit: 2023-02-03 | Discharge: 2023-02-03 | Disposition: A | Discharge status: Home / Self Care | Payer: Medicare Other | Attending: Emergency Medicine | Admitting: Emergency Medicine

## 2023-02-03 ENCOUNTER — Other Ambulatory Visit: Payer: Self-pay

## 2023-02-03 DIAGNOSIS — M79671 Pain in right foot: Secondary | ICD-10-CM | POA: Diagnosis not present

## 2023-02-03 DIAGNOSIS — X501XXA Overexertion from prolonged static or awkward postures, initial encounter: Secondary | ICD-10-CM | POA: Diagnosis not present

## 2023-02-03 DIAGNOSIS — G8918 Other acute postprocedural pain: Secondary | ICD-10-CM

## 2023-02-03 DIAGNOSIS — I10 Essential (primary) hypertension: Secondary | ICD-10-CM | POA: Diagnosis not present

## 2023-02-03 DIAGNOSIS — M7989 Other specified soft tissue disorders: Secondary | ICD-10-CM | POA: Diagnosis not present

## 2023-02-03 DIAGNOSIS — R600 Localized edema: Secondary | ICD-10-CM | POA: Diagnosis not present

## 2023-02-03 DIAGNOSIS — Z9889 Other specified postprocedural states: Secondary | ICD-10-CM | POA: Diagnosis not present

## 2023-02-03 DIAGNOSIS — M25571 Pain in right ankle and joints of right foot: Secondary | ICD-10-CM | POA: Diagnosis not present

## 2023-02-03 MED ORDER — GADOBUTROL 1 MMOL/ML IV SOLN
10.0000 mL | Freq: Once | INTRAVENOUS | Status: AC | PRN
  Administered 2023-02-03: 10 mL via INTRAVENOUS

## 2023-02-03 NOTE — Telephone Encounter (Signed)
Tamara Morgan called to let us know that she is on the way to [facility] ED. She stated something popped in her right heel and she is in a lot of pain.

## 2023-02-03 NOTE — Discharge Instructions (Addendum)
You have been seen here in the emergency department for leg pain. We have obtained a full history, performed a physical exam, in addition to other diagnostic tests and treatments. Right now, we feel that you are safe for discharge from a medical perspective, and do not have an acute life threatening illness.   To do: 1.) Take all medications as prescribed.   2.) If anything changes, or you develop fevers, chills, inability to eat or drink, severe pain, new symptoms, return of symptoms, worsening of symptoms, or any other concerns, please call 911 or come back to the emergency department as soon as possible.   3.) Please make an appointment with your primary care doctor for a follow-up visit after being seen here in the emergency department. If you do not have a primary care doctor, you can call [phone] for assistance in finding one or your health care insurance company.   Please also follow up with podiatry. Someone should reach out to you to make in an appointment. If you do not hear from someone in the next day or two, please call their clinic directly to make an appointment.   1. Complete retracted insertional tear of the distal Achilles tendon  with approximately 3.2 cm of retraction.  2. Postoperative changes related to recent calcaneal osteotomy.  3. Osteoarthritis of the midfoot.    Thank you for allowing me to take care of you today. We hope that you feel better soon.

## 2023-02-03 NOTE — ED Provider Notes (Signed)
[place] EMERGENCY DEPARTMENT AT [facility] Provider Note  HPI   Tamara Morgan is a 67 y.o. female patient with a PMHx of Hashimoto's thyroiditis, obstructive sleep apnea, hypertension, GERD, here today with concern for postoperative issue.  2 days ago on February 01, 2023, the patient had a calcaneal osteotomy done with podiatry.  She was splinted and plantarflexion, and in order to do this they need to separate the Achilles tendon from the calcaneus bone and we put it back together.  She has been nonambulatory on her foot as instructed, however she uses crutches.  Patient was maneuvering around a few boxes, and internally rotated her hip when she felt a pop in the back of her heel.    ROS Negative except as per HPI   Medical Decision Making   Upon presentation, the patient is afebrile hemodynamically stable Patient is diffuse swelling over the right ankle and right foot, incision site looks good, no evidence of dehiscence, clean dry and intact. Pulses and sensation are present  I discussed the case with the podiatry attending who performed the surgery, there is concern for possible Achilles rupture or other postoperative complication.  Right now I do not think there is infection, no evidence of neurovascular compromise.  We went to go ahead and perform an MRI of her foot and heel, in order to better characterize this.  Per podiatry, even if there is an Achilles rupture, they will follow-up the patient in their clinic, for interval surgery, given the fact that his very swollen right now and she would not benefit from surgery at this time regardless.   MRI shows 1. Complete retracted insertional tear of the distal Achilles tendon  with approximately 3.2 cm of retraction.  2. Postoperative changes related to recent calcaneal osteotomy.  3. Osteoarthritis of the midfoot.   I have counseled the patient on the findings, I have ordered a short leg splint in plantarflexion per  request of podiatry, and she will be following up with podiatry in clinic.  Patient will be discharged at this time, has pain medication at home will come back if any acutely changes.  Patient did have some mild bleeding from the surgical site, and I think this is normal expected postoperative bleeding, it is a minimal blood loss, and there was some dried blood on the splint when I took it down.  Will reapply splint at this time.  At this time, I feel that the patient is medically cleared for discharge and have discussed this with my attending who agrees.  I discussed with the patient and/or family my overall assessment, including my physical exam, labs, imaging, other diagnostic tests, and therapeutics given.  All questions answered and understanding is expressed.  I have instructed to call PCP to establish an outpatient appointment after this ED visit, and necessary specialty follow up if needed. I gave strict return precautions to come back to the ED including fevers, chills, severe pain, worsening of symptoms, return of symptoms, new and concerning symptoms, inability to tolerate p.o. intake, among others. I specifically stated to return if symptoms worsen or return.    1. Post-op pain     @DISPOSITION @  Rx / DC Orders ED Discharge Orders     None        Past Medical History:  Diagnosis Date   Arrhythmia    Benign tumor    Cardiac anomaly    GERD (gastroesophageal reflux disease)    Hashimoto's disease    History  of bone density study    Hyperlipidemia    Hypertension    Lyme disease    Malaria    OSA (obstructive sleep apnea)    Pap smear, as part of routine gynecological examination 2022   Past Surgical History:  Procedure Laterality Date   ABDOMINAL HYSTERECTOMY     CARDIAC CATHETERIZATION     COLONOSCOPY     several   ECTOPIC PREGNANCY SURGERY     OOPHORECTOMY Left    RECTOCELE REPAIR     VAGINAL HYSTERECTOMY     Family History  Problem Relation Age of Onset    Hypertension Mother    Hypothyroidism Mother    Heart Problems Mother        Heart stent   Dementia Mother 9   Arthritis Mother    Arthritis Father    Cancer Father        TTR   Hypertension Father    Diabetes Father        type 2   Hypertension Daughter    Appendicitis Daughter    Hypertension Son    Social History   Socioeconomic History   Marital status: Married    Spouse name: Not on file   Number of children: 3   Years of education: Not on file   Highest education level: Not on file  Occupational History   Not on file  Tobacco Use   Smoking status: Never    Passive exposure: Past   Smokeless tobacco: Never  Vaping Use   Vaping status: Never Used  Substance and Sexual Activity   Alcohol use: Yes    Alcohol/week: 1.0 - 2.0 standard drink of alcohol    Types: 1 - 2 Glasses of wine per week    Comment: occ   Drug use: Never   Sexual activity: Not on file  Other Topics Concern   Not on file  Social History Narrative   Not on file   Social Drivers of Health   Financial Resource Strain: Low Risk  (01/01/2023)   Received from [facility]   Overall Financial Resource Strain (CARDIA)    Difficulty of Paying Living Expenses: Not hard at all  Food Insecurity: No Food Insecurity (01/01/2023)   Received from [facility]   Hunger Vital Sign    Worried About Running Out of Food in the Last Year: Never true    Ran Out of Food in the Last Year: Never true  Transportation Needs: No Transportation Needs (01/01/2023)   Received from [facility] - Transportation    Lack of Transportation (Medical): No    Lack of Transportation (Non-Medical): No  Physical Activity: Insufficiently Active (01/01/2023)   Received from [facility]   Exercise Vital Sign    Days of Exercise per Week: 3 days    Minutes of Exercise per Session: 20 min  Stress: No Stress Concern Present (01/01/2023)   Received from [facility] -  Occupational Stress Questionnaire    Feeling of Stress : Not at all  Social Connections: Socially Integrated (01/01/2023)   Received from [facility]   Social Network    How would you rate your social network (family, work, friends)?: Good participation with social networks  Intimate Partner Violence: Not At Risk (01/01/2023)   Received from [facility]   HITS    Over the last 12 months how often did your partner physically hurt you?: Never    Over the  last 12 months how often did your partner insult you or talk down to you?: Rarely    Over the last 12 months how often did your partner threaten you with physical harm?: Never    Over the last 12 months how often did your partner scream or curse at you?: Never     Physical Exam   Vitals:   02/03/23 1236 02/03/23 1317 02/03/23 1900  BP: 139/72  (!) 149/59  Pulse: (!) 55  77  Resp: 14  18  Temp: 98.2 F (36.8 C)  97.7 F (36.5 C)  TempSrc:   Oral  SpO2: 96%    Weight:  120.2 kg   Height:  5\' 7"  (1.702 m)     Physical Exam Vitals and nursing note reviewed.  Constitutional:      General: She is not in acute distress.    Appearance: She is well-developed.  HENT:     Head: Normocephalic and atraumatic.  Eyes:     Conjunctiva/sclera: Conjunctivae normal.  Cardiovascular:     Rate and Rhythm: Normal rate and regular rhythm.     Heart sounds: No murmur heard. Pulmonary:     Effort: No respiratory distress.  Musculoskeletal:        General: Swelling present.     Cervical back: Neck supple.     Comments: Patient is diffuse swelling over the right ankle and right foot, incision site looks good, no evidence of dehiscence, clean dry and intact. Pulses and sensation are present  Skin:    General: Skin is warm and dry.     Capillary Refill: Capillary refill takes less than 2 seconds.  Neurological:     Mental Status: She is alert.  Psychiatric:        Mood and Affect: Mood normal.         Procedures   If procedures  were preformed on this patient, they are listed below:  Procedures  The patient was seen, evaluated, and treated in conjunction with the attending physician, who voiced agreement in the care provided.  Note generated using Dragon voice dictation software and may contain dictation errors. Please contact me for any clarification or with any questions.   Electronically signed by:  Osvaldo Shipper, M.D. (PGY-2)    Gunnar Bulla, MD 02/03/23 2047    Durwin Glaze, MD 02/03/23 [phone]

## 2023-02-03 NOTE — ED Triage Notes (Signed)
Pt states she had surgery on right leg, had a bone spur removed 2 days ago which included manipulation of achilles tendon. Pt states she was trying to go around her bed today, she felt a popping sensation and had pain in right lower leg immediately after. Pt took oxycodone w/o relief.

## 2023-02-03 NOTE — ED Provider Triage Note (Signed)
Emergency Medicine Provider Triage Evaluation Note  Tamara Morgan , a 67 y.o. female  was evaluated in triage.  Pt complains of right ankle pain, post op problem. States she had a bone spur removed from her right ankle with manipulation of her achilles tendon by Dr. Annamary Rummage with podiatry 2 days ago. Was using crutches and told to be non-weightbearing. States that she swung her leg laterally and felt a popping sensation in the back of her heel area followed by severe pain. Called her surgeons office and was told to come here. Presents for same.   Review of Systems  Positive:  Negative:   Physical Exam  BP 139/72 (BP Location: Right Arm)   Pulse (!) 55   Temp 98.2 F (36.8 C)   Resp 14   Ht 5\' 7"  (1.702 m)   Wt 120.2 kg   SpO2 96%   BMI 41.50 kg/m  Gen:   Awake, no distress   Resp:  Normal effort  MSK:   Moves extremities without difficulty  Other:  Post op bandages in place, not taken down by me. No obvious bleeding, toes with good sensation and capillary refill  Medical Decision Making  Medically screening exam initiated at 2:42 PM.  Appropriate orders placed.  Sedona Matson was informed that the remainder of the evaluation will be completed by another provider, this initial triage assessment does not replace that evaluation, and the importance of remaining in the ED until their evaluation is complete.  Work-up initiated   Vear Clock 02/03/23 1450

## 2023-02-03 NOTE — Progress Notes (Signed)
Orthopedic Tech Progress Note Patient Details:  [name] 10-06-1955 [id number]  Ortho Devices Type of Ortho Device: Ace wrap, Cotton web roll, Short leg splint Ortho Device/Splint Location: RLE Ortho Device/Splint Interventions: Ordered, Application, Adjustment   Post Interventions Patient Tolerated: Well Instructions Provided: Care of device  Donald Pore 02/03/2023, 9:41 PM

## 2023-02-06 ENCOUNTER — Ambulatory Visit: Payer: Medicare Other | Admitting: Podiatry

## 2023-02-06 DIAGNOSIS — M9261 Juvenile osteochondrosis of tarsus, right ankle: Secondary | ICD-10-CM

## 2023-02-06 DIAGNOSIS — Z9889 Other specified postprocedural states: Secondary | ICD-10-CM

## 2023-02-06 DIAGNOSIS — S86011A Strain of right Achilles tendon, initial encounter: Secondary | ICD-10-CM

## 2023-02-06 DIAGNOSIS — M898X7 Other specified disorders of bone, ankle and foot: Secondary | ICD-10-CM

## 2023-02-06 MED ORDER — OXYCODONE-ACETAMINOPHEN 5-325 MG PO TABS: 1.0000 | ORAL_TABLET | ORAL | 0 refills | Status: DC | PRN

## 2023-02-06 MED ORDER — CLINDAMYCIN HCL 300 MG PO CAPS: 300.0000 mg | ORAL_CAPSULE | Freq: Two times a day (BID) | ORAL | 0 refills | Status: DC

## 2023-02-06 NOTE — Progress Notes (Signed)
  Subjective:  Patient ID: Tamara Morgan, female    DOB: Mar 26, 1955,  MRN: [id number]  DOS: 02/01/23 Procedure: Right retrocalcaneal exostectomy, secondary repair of achilles  67 y.o. female seen for post op check. Pt seen for early post op check s/p above proc. ON Friday she reports she was maneuvering in her home and felt a pop in her right heel. She had severe pain and was directed to the ED by my office. She underwent MRI of the right foot and was found to have rupture of the right achilles with retraction. She denies putting weight down on the right foot but does state she has had serious difficulty being at home and struggling to maintain mobility in home with NWB to the RLE. Has a clean splint on right side. There was no dehisence noted when dressing changed in ED. Finished course of abx. Takin oxycodone and ibuprofen for pain.   Review of Systems: Negative except as noted in the HPI. Denies N/V/F/Ch.   Objective:  There were no vitals filed for this visit. There is no height or weight on file to calculate BMI. Constitutional Well developed. Well nourished.  Vascular Foot warm and well perfused. Capillary refill normal to all digits.   No calf pain with palpation  Neurologic Normal speech. Oriented to person, place, and time. Epicritic sensation intact  Dermatologic Splint dressing C/D/I to the right lower extremity  Orthopedic: Wiggles toes   Radiographs: Deferred today.   MRI From ED 12/20: 1. Complete retracted insertional tear of the distal Achilles tendon with approximately 3.2 cm of retraction. 2. Postoperative changes related to recent calcaneal osteotomy. 3. Osteoarthritis of the midfoot.  Pathology: NA  Micro: NA  Assessment:   1. Rupture of right Achilles tendon, initial encounter   2. Post-operative state   3. Haglund's deformity of right heel   4. Retrocalcaneal exostosis    Plan:  Patient was evaluated and treated and all questions answered.  POD # 5  s/p right foot retrocalc and secondary achilles repair  - Unfortunately patient sustained achilles tendon rupture. I suspect possibly due to putting some weight down on right side - does admit having trouble maintaining NWB status at home - Discussed she will require revision with FHL tendon transfer and revision repair. Plan to do this Friday. Discussed risks benefits alternatives and complications and she wished to proceed. Consent obtained.  -XR: Deferred today -WB Status: NWB in posterior splint -Sutures: To remain intact. -Medications/ABX: Continue percocet as needed for pain, Clindamycin 300 mg BID         Corinna Gab, DPM [facility] / [facility]

## 2023-02-09 ENCOUNTER — Encounter: Payer: Medicare Other | Admitting: Podiatry

## 2023-02-09 DIAGNOSIS — S4991XA Unspecified injury of right shoulder and upper arm, initial encounter: Secondary | ICD-10-CM | POA: Diagnosis not present

## 2023-02-09 DIAGNOSIS — S93401A Sprain of unspecified ligament of right ankle, initial encounter: Secondary | ICD-10-CM | POA: Insufficient documentation

## 2023-02-09 DIAGNOSIS — S42201A Unspecified fracture of upper end of right humerus, initial encounter for closed fracture: Secondary | ICD-10-CM | POA: Insufficient documentation

## 2023-02-09 DIAGNOSIS — Z043 Encounter for examination and observation following other accident: Secondary | ICD-10-CM | POA: Diagnosis not present

## 2023-02-09 DIAGNOSIS — M7989 Other specified soft tissue disorders: Secondary | ICD-10-CM | POA: Insufficient documentation

## 2023-02-09 DIAGNOSIS — W19XXXA Unspecified fall, initial encounter: Secondary | ICD-10-CM | POA: Diagnosis not present

## 2023-02-10 ENCOUNTER — Observation Stay
Admission: AD | Admit: 2023-02-10 | Discharge: 2023-02-14 | Disposition: A | Discharge status: Skilled Nursing Facility | Payer: Medicare Other | Attending: Internal Medicine | Admitting: Internal Medicine

## 2023-02-10 ENCOUNTER — Encounter
Admission: AD | Disposition: A | Discharge status: Skilled Nursing Facility | Payer: Self-pay | Source: Home / Self Care | Attending: Family Medicine

## 2023-02-10 ENCOUNTER — Emergency Department: Payer: Medicare Other

## 2023-02-10 ENCOUNTER — Other Ambulatory Visit: Payer: Self-pay

## 2023-02-10 ENCOUNTER — Emergency Department
Admission: EM | Admit: 2023-02-10 | Discharge: 2023-02-10 | Disposition: A | Discharge status: Home / Self Care | Payer: Medicare Other | Attending: Emergency Medicine | Admitting: Emergency Medicine

## 2023-02-10 ENCOUNTER — Ambulatory Visit: Payer: Medicare Other | Admitting: Certified Registered"

## 2023-02-10 ENCOUNTER — Ambulatory Visit: Payer: Medicare Other

## 2023-02-10 ENCOUNTER — Encounter: Payer: Self-pay | Admitting: Podiatry

## 2023-02-10 ENCOUNTER — Encounter: Payer: Self-pay | Admitting: Emergency Medicine

## 2023-02-10 DIAGNOSIS — R2681 Unsteadiness on feet: Secondary | ICD-10-CM | POA: Insufficient documentation

## 2023-02-10 DIAGNOSIS — E063 Autoimmune thyroiditis: Secondary | ICD-10-CM

## 2023-02-10 DIAGNOSIS — S42254A Nondisplaced fracture of greater tuberosity of right humerus, initial encounter for closed fracture: Secondary | ICD-10-CM | POA: Insufficient documentation

## 2023-02-10 DIAGNOSIS — E785 Hyperlipidemia, unspecified: Secondary | ICD-10-CM

## 2023-02-10 DIAGNOSIS — S86011D Strain of right Achilles tendon, subsequent encounter: Secondary | ICD-10-CM | POA: Diagnosis not present

## 2023-02-10 DIAGNOSIS — Z8719 Personal history of other diseases of the digestive system: Secondary | ICD-10-CM

## 2023-02-10 DIAGNOSIS — S86001A Unspecified injury of right Achilles tendon, initial encounter: Secondary | ICD-10-CM | POA: Diagnosis present

## 2023-02-10 DIAGNOSIS — M7989 Other specified soft tissue disorders: Secondary | ICD-10-CM | POA: Diagnosis not present

## 2023-02-10 DIAGNOSIS — Z9181 History of falling: Secondary | ICD-10-CM | POA: Insufficient documentation

## 2023-02-10 DIAGNOSIS — S42309A Unspecified fracture of shaft of humerus, unspecified arm, initial encounter for closed fracture: Secondary | ICD-10-CM

## 2023-02-10 DIAGNOSIS — S93401A Sprain of unspecified ligament of right ankle, initial encounter: Secondary | ICD-10-CM

## 2023-02-10 DIAGNOSIS — K219 Gastro-esophageal reflux disease without esophagitis: Secondary | ICD-10-CM | POA: Diagnosis not present

## 2023-02-10 DIAGNOSIS — I10 Essential (primary) hypertension: Secondary | ICD-10-CM

## 2023-02-10 DIAGNOSIS — Z9889 Other specified postprocedural states: Secondary | ICD-10-CM | POA: Diagnosis not present

## 2023-02-10 DIAGNOSIS — S42291A Other displaced fracture of upper end of right humerus, initial encounter for closed fracture: Secondary | ICD-10-CM | POA: Diagnosis not present

## 2023-02-10 DIAGNOSIS — Z7901 Long term (current) use of anticoagulants: Secondary | ICD-10-CM | POA: Insufficient documentation

## 2023-02-10 DIAGNOSIS — Z79899 Other long term (current) drug therapy: Secondary | ICD-10-CM | POA: Insufficient documentation

## 2023-02-10 DIAGNOSIS — S86011A Strain of right Achilles tendon, initial encounter: Principal | ICD-10-CM | POA: Insufficient documentation

## 2023-02-10 DIAGNOSIS — G4733 Obstructive sleep apnea (adult) (pediatric): Secondary | ICD-10-CM | POA: Diagnosis not present

## 2023-02-10 DIAGNOSIS — W19XXXA Unspecified fall, initial encounter: Secondary | ICD-10-CM | POA: Diagnosis not present

## 2023-02-10 DIAGNOSIS — M6281 Muscle weakness (generalized): Secondary | ICD-10-CM | POA: Diagnosis not present

## 2023-02-10 DIAGNOSIS — Z043 Encounter for examination and observation following other accident: Secondary | ICD-10-CM | POA: Diagnosis not present

## 2023-02-10 DIAGNOSIS — M67871 Other specified disorders of synovium, right ankle and foot: Secondary | ICD-10-CM | POA: Diagnosis not present

## 2023-02-10 DIAGNOSIS — I4891 Unspecified atrial fibrillation: Secondary | ICD-10-CM | POA: Diagnosis not present

## 2023-02-10 DIAGNOSIS — S86019A Strain of unspecified Achilles tendon, initial encounter: Secondary | ICD-10-CM | POA: Diagnosis present

## 2023-02-10 DIAGNOSIS — I471 Supraventricular tachycardia, unspecified: Secondary | ICD-10-CM | POA: Diagnosis not present

## 2023-02-10 DIAGNOSIS — Z419 Encounter for procedure for purposes other than remedying health state, unspecified: Principal | ICD-10-CM

## 2023-02-10 DIAGNOSIS — Z4789 Encounter for other orthopedic aftercare: Secondary | ICD-10-CM | POA: Diagnosis not present

## 2023-02-10 HISTORY — PX: ACHILLES TENDON SURGERY: SHX542

## 2023-02-10 HISTORY — PX: TENDON TRANSFER: SHX6109

## 2023-02-10 LAB — CBC WITH DIFFERENTIAL/PLATELET
Abs Immature Granulocytes: 0.04 10*3/uL (ref 0.00–0.07)
Basophils Absolute: 0.1 10*3/uL (ref 0.0–0.1)
Basophils Relative: 1 %
Eosinophils Absolute: 0.1 10*3/uL (ref 0.0–0.5)
Eosinophils Relative: 1 %
HCT: 36.9 % (ref 36.0–46.0)
Hemoglobin: 11.8 g/dL — ABNORMAL LOW (ref 12.0–15.0)
Immature Granulocytes: 1 %
Lymphocytes Relative: 17 %
Lymphs Abs: 1.3 10*3/uL (ref 0.7–4.0)
MCH: 28.9 pg (ref 26.0–34.0)
MCHC: 32 g/dL (ref 30.0–36.0)
MCV: 90.2 fL (ref 80.0–100.0)
Monocytes Absolute: 0.2 10*3/uL (ref 0.1–1.0)
Monocytes Relative: 3 %
Neutro Abs: 6.1 10*3/uL (ref 1.7–7.7)
Neutrophils Relative %: 77 %
Platelets: 216 10*3/uL (ref 150–400)
RBC: 4.09 MIL/uL (ref 3.87–5.11)
RDW: 12.9 % (ref 11.5–15.5)
WBC: 7.9 10*3/uL (ref 4.0–10.5)
nRBC: 0 % (ref 0.0–0.2)

## 2023-02-10 LAB — COMPREHENSIVE METABOLIC PANEL
ALT: 36 U/L (ref 0–44)
AST: 42 U/L — ABNORMAL HIGH (ref 15–41)
Albumin: 3.7 g/dL (ref 3.5–5.0)
Alkaline Phosphatase: 63 U/L (ref 38–126)
Anion gap: 11 (ref 5–15)
BUN: 17 mg/dL (ref 8–23)
CO2: 20 mmol/L — ABNORMAL LOW (ref 22–32)
Calcium: 8.8 mg/dL — ABNORMAL LOW (ref 8.9–10.3)
Chloride: 108 mmol/L (ref 98–111)
Creatinine, Ser: 0.78 mg/dL (ref 0.44–1.00)
GFR, Estimated: >60 mL/min (ref >60)
Glucose, Bld: 158 mg/dL — ABNORMAL HIGH (ref 70–99)
Potassium: 4.5 mmol/L (ref 3.5–5.1)
Sodium: 139 mmol/L (ref 135–145)
Total Bilirubin: 0.5 mg/dL (ref <1.2)
Total Protein: 6.5 g/dL (ref 6.5–8.1)

## 2023-02-10 SURGERY — TRANSFER, TENDON
Anesthesia: General | Site: Foot | Laterality: Right

## 2023-02-10 MED ORDER — ORAL CARE MOUTH RINSE: 15.0000 mL | Freq: Once | OROMUCOSAL | Status: AC

## 2023-02-10 MED ORDER — 0.9 % SODIUM CHLORIDE (POUR BTL) OPTIME
TOPICAL | Status: DC | PRN
  Administered 2023-02-10: 500 mL

## 2023-02-10 MED ORDER — ONDANSETRON HCL 4 MG/2ML IJ SOLN
INTRAMUSCULAR | Status: DC | PRN
  Administered 2023-02-10: 4 mg via INTRAVENOUS

## 2023-02-10 MED ORDER — OXYCODONE HCL 5 MG PO TABS
5.0000 mg | ORAL_TABLET | Freq: Once | ORAL | Status: AC | PRN
  Administered 2023-02-10: 5 mg via ORAL

## 2023-02-10 MED ORDER — FENTANYL CITRATE (PF) 100 MCG/2ML IJ SOLN
INTRAMUSCULAR | Status: AC
  Filled 2023-02-10: qty 2

## 2023-02-10 MED ORDER — ONDANSETRON HCL 4 MG/2ML IJ SOLN: 4.0000 mg | Freq: Once | INTRAMUSCULAR | Status: DC | PRN

## 2023-02-10 MED ORDER — OXYCODONE HCL 5 MG PO TABS
ORAL_TABLET | ORAL | Status: AC
  Filled 2023-02-10: qty 1

## 2023-02-10 MED ORDER — BUPIVACAINE LIPOSOME 1.3 % IJ SUSP
INTRAMUSCULAR | Status: AC
  Filled 2023-02-10: qty 10

## 2023-02-10 MED ORDER — CEFAZOLIN SODIUM-DEXTROSE 2-4 GM/100ML-% IV SOLN
INTRAVENOUS | Status: AC
  Filled 2023-02-10: qty 100

## 2023-02-10 MED ORDER — DEXAMETHASONE SODIUM PHOSPHATE 10 MG/ML IJ SOLN
INTRAMUSCULAR | Status: DC | PRN
  Administered 2023-02-10: 5 mg via INTRAVENOUS

## 2023-02-10 MED ORDER — FENTANYL CITRATE (PF) 100 MCG/2ML IJ SOLN
INTRAMUSCULAR | Status: DC | PRN
  Administered 2023-02-10: 25 ug via INTRAVENOUS
  Administered 2023-02-10: 75 ug via INTRAVENOUS

## 2023-02-10 MED ORDER — LIDOCAINE HCL (CARDIAC) PF 100 MG/5ML IV SOSY
PREFILLED_SYRINGE | INTRAVENOUS | Status: DC | PRN
  Administered 2023-02-10: 40 mg via INTRAVENOUS

## 2023-02-10 MED ORDER — MIDAZOLAM HCL 2 MG/2ML IJ SOLN
INTRAMUSCULAR | Status: AC
  Filled 2023-02-10: qty 2

## 2023-02-10 MED ORDER — LACTATED RINGERS IV SOLN: INTRAVENOUS | Status: DC

## 2023-02-10 MED ORDER — CEFAZOLIN SODIUM-DEXTROSE 1-4 GM/50ML-% IV SOLN
INTRAVENOUS | Status: AC
  Filled 2023-02-10: qty 50

## 2023-02-10 MED ORDER — ENOXAPARIN SODIUM 40 MG/0.4ML IJ SOSY: 40.0000 mg | PREFILLED_SYRINGE | INTRAMUSCULAR | Status: DC

## 2023-02-10 MED ORDER — PROPOFOL 10 MG/ML IV BOLUS
INTRAVENOUS | Status: DC | PRN
  Administered 2023-02-10: 140 mg via INTRAVENOUS

## 2023-02-10 MED ORDER — CLINDAMYCIN PHOSPHATE 900 MG/50ML IV SOLN
900.0000 mg | Freq: Once | INTRAVENOUS | Status: DC
Start: 2023-02-10 — End: 2023-02-10

## 2023-02-10 MED ORDER — CEFAZOLIN SODIUM-DEXTROSE 2-4 GM/100ML-% IV SOLN
2.0000 g | Freq: Three times a day (TID) | INTRAVENOUS | Status: AC
  Administered 2023-02-10 – 2023-02-11 (×3): 2 g via INTRAVENOUS
  Filled 2023-02-10 (×3): qty 100

## 2023-02-10 MED ORDER — CHLORHEXIDINE GLUCONATE 0.12 % MT SOLN
15.0000 mL | Freq: Once | OROMUCOSAL | Status: AC
  Administered 2023-02-10: 15 mL via OROMUCOSAL

## 2023-02-10 MED ORDER — ONDANSETRON HCL 4 MG PO TABS: 4.0000 mg | ORAL_TABLET | Freq: Four times a day (QID) | ORAL | Status: DC | PRN

## 2023-02-10 MED ORDER — OXYCODONE-ACETAMINOPHEN 5-325 MG PO TABS
1.0000 | ORAL_TABLET | Freq: Three times a day (TID) | ORAL | Status: DC | PRN
  Administered 2023-02-10 – 2023-02-14 (×8): 1 via ORAL
  Filled 2023-02-10 (×9): qty 1

## 2023-02-10 MED ORDER — PANTOPRAZOLE SODIUM 40 MG PO TBEC
40.0000 mg | DELAYED_RELEASE_TABLET | Freq: Every evening | ORAL | Status: DC
  Administered 2023-02-10 – 2023-02-14 (×5): 40 mg via ORAL
  Filled 2023-02-10 (×5): qty 1

## 2023-02-10 MED ORDER — DEXTROSE 5 % IV SOLN
INTRAVENOUS | Status: DC | PRN
  Administered 2023-02-10: 3 g via INTRAVENOUS

## 2023-02-10 MED ORDER — DEXTROSE 5 % IV SOLN: INTRAVENOUS | Status: DC | PRN

## 2023-02-10 MED ORDER — PHENYLEPHRINE 80 MCG/ML (10ML) SYRINGE FOR IV PUSH (FOR BLOOD PRESSURE SUPPORT)
PREFILLED_SYRINGE | INTRAVENOUS | Status: DC | PRN
  Administered 2023-02-10 (×2): 80 ug via INTRAVENOUS

## 2023-02-10 MED ORDER — FENTANYL CITRATE PF 50 MCG/ML IJ SOSY
PREFILLED_SYRINGE | INTRAMUSCULAR | Status: AC
  Filled 2023-02-10: qty 1

## 2023-02-10 MED ORDER — SUGAMMADEX SODIUM 200 MG/2ML IV SOLN
INTRAVENOUS | Status: DC | PRN
  Administered 2023-02-10: 200 mg via INTRAVENOUS

## 2023-02-10 MED ORDER — CHLORHEXIDINE GLUCONATE 0.12 % MT SOLN
OROMUCOSAL | Status: AC
  Filled 2023-02-10: qty 15

## 2023-02-10 MED ORDER — FENTANYL CITRATE PF 50 MCG/ML IJ SOSY
50.0000 ug | PREFILLED_SYRINGE | Freq: Once | INTRAMUSCULAR | Status: AC
  Administered 2023-02-10: 50 ug via INTRAVENOUS

## 2023-02-10 MED ORDER — DEXMEDETOMIDINE HCL IN NACL 80 MCG/20ML IV SOLN
INTRAVENOUS | Status: DC | PRN
  Administered 2023-02-10 (×2): 8 ug via INTRAVENOUS

## 2023-02-10 MED ORDER — BUPIVACAINE HCL (PF) 0.5 % IJ SOLN
INTRAMUSCULAR | Status: AC
  Filled 2023-02-10: qty 10

## 2023-02-10 MED ORDER — FENTANYL CITRATE (PF) 100 MCG/2ML IJ SOLN
25.0000 ug | INTRAMUSCULAR | Status: DC | PRN
  Administered 2023-02-10: 25 ug via INTRAVENOUS

## 2023-02-10 MED ORDER — ACETAMINOPHEN 10 MG/ML IV SOLN
1000.0000 mg | Freq: Once | INTRAVENOUS | Status: DC | PRN
  Administered 2023-02-10: 1000 mg via INTRAVENOUS

## 2023-02-10 MED ORDER — CLINDAMYCIN PHOSPHATE 900 MG/50ML IV SOLN
INTRAVENOUS | Status: AC
  Filled 2023-02-10: qty 50

## 2023-02-10 MED ORDER — BUPIVACAINE HCL (PF) 0.5 % IJ SOLN
INTRAMUSCULAR | Status: DC | PRN
  Administered 2023-02-10: 10 mL

## 2023-02-10 MED ORDER — APIXABAN 2.5 MG PO TABS: 2.5000 mg | ORAL_TABLET | Freq: Two times a day (BID) | ORAL | Status: DC

## 2023-02-10 MED ORDER — METOPROLOL SUCCINATE ER 25 MG PO TB24
25.0000 mg | ORAL_TABLET | Freq: Every day | ORAL | Status: DC
  Administered 2023-02-10 – 2023-02-13 (×4): 25 mg via ORAL
  Filled 2023-02-10 (×4): qty 1

## 2023-02-10 MED ORDER — ONDANSETRON HCL 4 MG/2ML IJ SOLN: 4.0000 mg | Freq: Four times a day (QID) | INTRAMUSCULAR | Status: DC | PRN

## 2023-02-10 MED ORDER — OXYCODONE HCL 5 MG/5ML PO SOLN: 5.0000 mg | Freq: Once | ORAL | Status: AC | PRN

## 2023-02-10 MED ORDER — BUPIVACAINE LIPOSOME 1.3 % IJ SUSP
INTRAMUSCULAR | Status: DC | PRN
  Administered 2023-02-10: 10 mL

## 2023-02-10 MED ORDER — ACETAMINOPHEN 10 MG/ML IV SOLN
INTRAVENOUS | Status: AC
  Filled 2023-02-10: qty 100

## 2023-02-10 SURGICAL SUPPLY — 50 items
ANCHOR SWIVELOCK BIO 4.75X19.1 (Anchor) IMPLANT
ANCHOR SWIVELOCK SP BC 5.5 (Anchor) IMPLANT
APPLICATOR CHLORAPREP 10.5 ORG (MISCELLANEOUS) IMPLANT
BAG COUNTER SPONGE SURGICOUNT (BAG) IMPLANT
BASIN KIT SINGLE STR (MISCELLANEOUS) ×1 IMPLANT
BLADE SURG SZ10 CARB STEEL (BLADE) ×1 IMPLANT
COVER LIGHT HANDLE STERIS (MISCELLANEOUS) ×1 IMPLANT
DRAPE FLUOR MINI C-ARM 54X84 (DRAPES) IMPLANT
DURAPREP 26ML APPLICATOR (WOUND CARE) ×1 IMPLANT
GAUZE 4X4 16PLY ~~LOC~~+RFID DBL (SPONGE) IMPLANT
GAUZE PAD ABD 8X10 STRL (GAUZE/BANDAGES/DRESSINGS) ×1 IMPLANT
GAUZE SPONGE 4X4 12PLY STRL (GAUZE/BANDAGES/DRESSINGS) ×1 IMPLANT
GAUZE XEROFORM 1X8 LF (GAUZE/BANDAGES/DRESSINGS) IMPLANT
GLOVE BIOGEL M STRL SZ7.5 (GLOVE) ×1 IMPLANT
GLOVE BIOGEL PI IND STRL 7.5 (GLOVE) ×1 IMPLANT
GLOVE PI ORTHO PRO STRL 7.5 (GLOVE) ×1 IMPLANT
GLOVE SKINSENSE STRL SZ7.5 (GLOVE) ×1 IMPLANT
GOWN STRL REUS W/ TWL XL LVL3 (GOWN DISPOSABLE) ×1 IMPLANT
GRAFT TISS 20X25 1 THK DERM (Tissue) IMPLANT
IMP SYS 2ND FIX PEEK 4.75X19.1 (Miscellaneous) ×1 IMPLANT
IMPL FDL SYSTEM 5.5 (Shoulder) IMPLANT
IMPL SYS 2ND FX PEEK 4.75X19.1 (Miscellaneous) IMPLANT
IMPLANT FDL SYSTEM 5.5 (Shoulder) ×1 IMPLANT
KIT SIZER GRAFT TENODESIS SUTR (KITS) IMPLANT
KIT TURNOVER KIT A (KITS) IMPLANT
NS IRRIG 1000ML POUR BTL (IV SOLUTION) ×1 IMPLANT
PACK EXTREMITY ARMC (MISCELLANEOUS) ×1 IMPLANT
PAD ABD DERMACEA PRESS 5X9 (GAUZE/BANDAGES/DRESSINGS) IMPLANT
PAD ARMBOARD 7.5X6 YLW CONV (MISCELLANEOUS) ×1 IMPLANT
PAD CAST 4YDX4 CTTN HI CHSV (CAST SUPPLIES) ×1 IMPLANT
PADDING CAST BLEND 4X4 STRL (MISCELLANEOUS) IMPLANT
PADDING CAST COTTON 6X4 STRL (CAST SUPPLIES) ×1 IMPLANT
PASSER SUT SWANSON 36MM LOOP (INSTRUMENTS) ×1 IMPLANT
PENCIL SMOKE EVACUATOR (MISCELLANEOUS) IMPLANT
RETRIEVER SUT HEWSON (MISCELLANEOUS) IMPLANT
SPLINT CAST 1 STEP 4X30 (MISCELLANEOUS) IMPLANT
STAPLER SKIN PROX 35W (STAPLE) ×1 IMPLANT
STOCKINETTE 48X4 2 PLY STRL (GAUZE/BANDAGES/DRESSINGS) ×1 IMPLANT
STOCKINETTE 4X48 STRL (DRAPES) ×1 IMPLANT
STOCKINETTE STRL 4IN 9604848 (GAUZE/BANDAGES/DRESSINGS) ×1 IMPLANT
SUT FIBERWIRE #2 38 T-5 BLUE (SUTURE)
SUT MNCRL 4-0 27XMFL (SUTURE) ×1
SUT VIC AB 0 CT2 27 (SUTURE) ×1 IMPLANT
SUT VIC AB 2-0 CT1 TAPERPNT 27 (SUTURE) ×2 IMPLANT
SUTURE FIBERWR #2 38 T-5 BLUE (SUTURE) ×2 IMPLANT
SUTURE MNCRL 4-0 27XMF (SUTURE) IMPLANT
TAPE STRIPS DRAPE STRL (GAUZE/BANDAGES/DRESSINGS) ×1 IMPLANT
TISSUE ARTHROFLEX THICK 4 (Tissue) ×1 IMPLANT
TOWEL OR 17X26 4PK STRL BLUE (TOWEL DISPOSABLE) ×2 IMPLANT
WATER STERILE IRR 1000ML POUR (IV SOLUTION) ×1 IMPLANT

## 2023-02-10 NOTE — Assessment & Plan Note (Addendum)
Baseline history of atrial fibrillation-cardiologist in the [facility] system per pt  Continue metoprolol AC on hold for now perioperatively

## 2023-02-10 NOTE — Progress Notes (Signed)
Dinner order placed 

## 2023-02-10 NOTE — Transfer of Care (Signed)
Immediate Anesthesia Transfer of Care Note  Patient: Tamara Morgan  Procedure(s) Performed: RIGHT FOOT TENDON TRANSFER WITH TENDON GRAFT APPLICATION (Right: Foot) ACHILLES TENDON REPAIR (Right: Foot)  Patient Location: PACU  Anesthesia Type:General  Level of Consciousness: sedated  Airway & Oxygen Therapy: Patient Spontanous Breathing and Patient connected to face mask oxygen  Post-op Assessment: Report given to RN and Post -op Vital signs reviewed and stable  Post vital signs: Reviewed and stable  Last Vitals:  Vitals Value Taken Time  BP 149/54 02/10/23 1141  Temp 97.5 02/10/23 1145  Pulse 75 02/10/23 1145  Resp 19 02/10/23 1145  SpO2 96 % 02/10/23 1145  Vitals shown include unfiled device data.  Last Pain:  Vitals:   02/10/23 0804  TempSrc:   PainSc: 10-Worst pain ever         Complications:  Encounter Notable Events  Notable Event Outcome Phase Comment  Difficult to intubate - unexpected  Intraprocedure Filed from anesthesia note documentation.

## 2023-02-10 NOTE — Progress Notes (Signed)
Updated patient spouse via messaging services.

## 2023-02-10 NOTE — Anesthesia Procedure Notes (Signed)
Anesthesia Regional Block: Popliteal block   Pre-Anesthetic Checklist: , timeout performed,  Correct Patient, Correct Site, Correct Laterality,  Correct Procedure, Correct Position, site marked,  Risks and benefits discussed,  Surgical consent,  Pre-op evaluation,  At surgeon's request and post-op pain management  Laterality: Lower and Right  Prep: chloraprep       Needles:  Injection technique: Single-shot  Needle Type: Echogenic Needle     Needle Length: 9cm  Needle Gauge: 21     Additional Needles:   Procedures:,,,, ultrasound used (permanent image in chart),,    Narrative:  Injection made incrementally with aspirations every 5 mL.  Performed by: Personally  Anesthesiologist: Corinda Gubler, MD  Additional Notes: Challenging visualization due to body habitus.   Patient's chart reviewed and they were deemed appropriate candidate for procedure, at surgeon's request. Patient educated about risks, benefits, and alternatives of the block including but not limited to: temporary or permanent nerve damage, bleeding, infection, damage to surround tissues, block failure, local anesthetic toxicity. Patient expressed understanding. A formal time-out was conducted consistent with institution rules.  Monitors were applied, and minimal sedation used. The site was prepped with skin prep and allowed to dry, and sterile gloves were used. A high frequency linear ultrasound probe with probe cover was utilized at first, however I switched to curvilinear probe due to patient's large body habitus and depth of relevant structures. Popliteal artery pulsatile and visualized in popliteal fossa along with adjacent sciatic nerve and its branch point, which appeared anatomically normal, local anesthetic injected around them just proximal to the branch point, and echogenic block needle trajectory was monitored throughout. Aspiration performed every 5ml. Blood vessels were avoided. All injections were performed  without resistance and free of blood and paresthesias. The patient tolerated the procedure well.  Injectate: 10ml exparel + 10ml 0.5% bupivacaine

## 2023-02-10 NOTE — Progress Notes (Signed)
Sent message to patient's husband, to update him. Still waiting for a room assignment at this time. Patient is resting comfortably in bed. VSS. Will continue to monitor.

## 2023-02-10 NOTE — Anesthesia Procedure Notes (Signed)
Procedure Name: Intubation Date/Time: 02/10/2023 9:32 AM  Performed by: Genia Del, CRNAPre-anesthesia Checklist: Patient identified, Patient being monitored, Timeout performed, Emergency Drugs available and Suction available Patient Re-evaluated:Patient Re-evaluated prior to induction Oxygen Delivery Method: Circle system utilized Preoxygenation: Pre-oxygenation with 100% oxygen Induction Type: IV induction Ventilation: Mask ventilation without difficulty Laryngoscope Size: McGrath and 4 Grade View: Grade II Tube type: Oral Tube size: 7.0 mm Number of attempts: 1 Airway Equipment and Method: Stylet Placement Confirmation: ETT inserted through vocal cords under direct vision, positive ETCO2 and breath sounds checked- equal and bilateral Secured at: 22 cm Tube secured with: Tape Dental Injury: Teeth and Oropharynx as per pre-operative assessment  Difficulty Due To: Difficulty was unanticipated Comments: 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  Short thick neck.    Positioned pt with shoulder roll (2 rolled blankets) and head on foam sq pillow plus one regular bed pillow for good sniffing position.   Easy mask aw without OA.  Eyes taped closed prior to DL.    DL with ant grade 2 view even using McGrath videoscope.  Required suction due to copious clear oral frothy secretions.  ETT placed after sharp J curve of styletted ETT.  Adhesive goggles placed over taped closed eyes.   Open face prone foam sq pillow placed over face .

## 2023-02-10 NOTE — Assessment & Plan Note (Signed)
S/p ablation  Cont home metoprolol

## 2023-02-10 NOTE — ED Triage Notes (Signed)
Pt reports new fall tonight with re-injury to her right ankle when her walker tipped over. Sts known injury to achilles with schedule surgery morning of 12/27 here at [facility]. Also report pain to left index finger and right shoulder pain. Has been holding eliquis since Monday. No head injury.

## 2023-02-10 NOTE — Assessment & Plan Note (Signed)
Status post repair of right Achilles tendon rupture with podiatry Significant pain and difficulty with ambulation Tentative plan for PT OT evaluation for placement Pain control Otherwise follow-up further recommendations from Dr. Annamary Rummage

## 2023-02-10 NOTE — Anesthesia Preprocedure Evaluation (Addendum)
Anesthesia Evaluation  Patient identified by MRN, date of birth, ID band Patient awake    Reviewed: Allergy & Precautions, NPO status , Patient's Chart, lab work & pertinent test results  History of Anesthesia Complications Negative for: history of anesthetic complications  Airway Mallampati: III  TM Distance: <3 FB Neck ROM: Full  Mouth opening: Limited Mouth Opening  Dental no notable dental hx. (+) Teeth Intact   Pulmonary sleep apnea and Continuous Positive Airway Pressure Ventilation , neg COPD, Patient abstained from smoking.Not current smoker   Pulmonary exam normal breath sounds clear to auscultation       Cardiovascular Exercise Tolerance: Good METShypertension, Pt. 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 No date: GERD (gastroesophageal reflux disease) No date: Hashimoto's disease No date: History of bone density study No date: Hyperlipidemia No date: Hypertension No date: Lyme disease No date: Malaria No date: OSA (obstructive sleep apnea) 2022: Pap smear, as part of routine gynecological examination  Reproductive/Obstetrics                             Anesthesia Physical Anesthesia Plan  ASA: 3  Anesthesia Plan: General   Post-op Pain Management: Ofirmev IV (intra-op)* and Regional block*   Induction: Intravenous  PONV Risk Score and Plan: 3 and Ondansetron, Dexamethasone and Treatment may  vary due to age or medical condition  Airway Management Planned: Oral ETT and Video Laryngoscope Planned  Additional Equipment: None  Intra-op Plan:   Post-operative Plan: Extubation in OR  Informed Consent: I have reviewed the patients History and Physical, chart, labs and discussed the procedure including the risks, benefits and alternatives for the proposed anesthesia with the patient or authorized representative who has indicated his/her understanding and acceptance.     Dental advisory given  Plan Discussed with: CRNA and Surgeon  Anesthesia Plan Comments: (Discussed risks of anesthesia with patient, including PONV, sore throat, lip/dental/eye damage. Rare risks discussed as well, such as cardiorespiratory and neurological sequelae, and allergic reactions. Discussed the role of CRNA in patient's perioperative care. Patient understands. Patient says she had achilles surgery last week, no record of it in our system. She says she had blocks done (sounds like popliteal and adductor canal) which she said worked very well for 3-4 days.  Discussed r/b/a of popliteal nerve block, including:  - bleeding, infection, nerve damage - poor or non functioning block. - reactions and toxicity to local anesthetic Patient understands.  Patient informed about increased incidence of above perioperative risk due to high BMI. Patient understands.  Patient has listed allergy to cephalexin - severe itching Severe blistering skin reaction (SJS/TEN)? no Liver or kidney injury caused by PCN? no Hemolytic anemia from PCN? no Drug fever? no Painful swollen joints? no Severe reaction involving inside of mouth, eye, or genital ulcers? no Based on current evidence Elizebeth Koller et al, J Allergy Clin Immunol Pract, 2019), will proceed with cefazolin use: Yes . No common side chains  between cephalexin and cefazolin.    )       Anesthesia Quick Evaluation

## 2023-02-10 NOTE — Progress Notes (Addendum)
2228 - Pt requested CPAP before sleep. RN called Respiratory Therapist to come and set up CPAP for the patient. RT said that she will come and set up CPAP for the patient.  2240 - RT came to set up pt CPAP.

## 2023-02-10 NOTE — Progress Notes (Addendum)
Pt asked for CPAP to be set up in her room by RT because she has Sleep Apnea. RN notified Provider.

## 2023-02-10 NOTE — Assessment & Plan Note (Signed)
PPI ?

## 2023-02-10 NOTE — ED Provider Notes (Signed)
[facility] Provider Note    Event Date/Time   First MD Initiated Contact with Patient 02/10/23 [phone]     (approximate)   History   Ankle Pain (R) and Fall   HPI  Tamara Morgan is a 67 y.o. female who presents to the ED for evaluation of Ankle Pain (R) and Fall   Review of podiatry clinic visit from 12/23.  Concern for rupture of her right sided Achilles tendon, has surgery later this morning with podiatry for this.  She presents to the ED after a mechanical fall where she injured this right ankle again, her left second finger and her right shoulder.  No syncope or head trauma.   Physical Exam   Triage Vital Signs: ED Triage Vitals  Encounter Vitals Group     BP 02/10/23 0017 (!) 152/83     Systolic BP Percentile --      Diastolic BP Percentile --      Pulse Rate 02/10/23 0017 67     Resp 02/10/23 0017 15     Temp 02/10/23 0017 97.7 F (36.5 C)     Temp Source 02/10/23 0017 Oral     SpO2 02/10/23 0017 99 %     Weight 02/10/23 0018 265 lb (120.2 kg)     Height 02/10/23 0018 5\' 7"  (1.702 m)     Head Circumference --      Peak Flow --      Pain Score 02/10/23 0018 10     Pain Loc --      Pain Education --      Exclude from Growth Chart --     Most recent vital signs: Vitals:   02/10/23 0017 02/10/23 0500  BP: (!) 152/83 (!) 146/78  Pulse: 67 72  Resp: 15 18  Temp: 97.7 F (36.5 C) 98 F (36.7 C)  SpO2: 99% 100%    General: Awake, no distress.  CV:  Good peripheral perfusion.  Resp:  Normal effort.  Abd:  No distention.  MSK:    Closed swelling of the right ankle under an Ace bandage Bruising and soft tissue swelling of the left second finger.  Full active range of motion Point tenderness to the proximal right humerus without overlying skin changes Neuro:  No focal deficits appreciated. Other:     ED Results / Procedures / Treatments   Labs (all labs ordered are listed, but only abnormal results are displayed) Labs  Reviewed - No data to display  EKG   RADIOLOGY Plain film of the right ankle interpreted by me without evidence of fracture or dislocation Plain film of the left index finger interpreted by me without evidence of fracture or dislocation Plan some of the right shoulder interpreted by me with nondisplaced humeral head fracture  Official radiology report(s): DG Ankle Complete Right Result Date: 02/10/2023 CLINICAL DATA:  Status post fall. EXAM: RIGHT ANKLE - COMPLETE 3+ VIEW COMPARISON:  February 03, 2023 FINDINGS: The right ankle was imaged in a fiberglass cast with subsequently obscured osseous and soft tissue detail. There is no evidence of an acute fracture, dislocation, or joint effusion. There is no evidence of arthropathy or other focal bone abnormality. Moderate severity diffuse soft tissue swelling is seen. IMPRESSION: Moderate severity diffuse soft tissue swelling without evidence of an acute osseous abnormality. Electronically Signed   By: Aram Candela M.D.   On: 02/10/2023 01:11   DG Finger Index Left Result Date: 02/10/2023 CLINICAL DATA:  Status post fall. EXAM:  LEFT INDEX FINGER 2+V COMPARISON:  None Available. FINDINGS: There is no evidence of fracture or dislocation. Moderate severity degenerative changes seen involving the PIP and DIP joints. Mild diffuse soft tissue swelling is noted. IMPRESSION: Mild diffuse soft tissue swelling without evidence of an acute osseous abnormality. Electronically Signed   By: Aram Candela M.D.   On: 02/10/2023 01:08   DG Shoulder Right Result Date: 02/10/2023 CLINICAL DATA:  Status post fall. EXAM: RIGHT SHOULDER - 2+ VIEW COMPARISON:  None Available. FINDINGS: There is a tiny area of cortical irregularity seen along the greater tubercle of the right humeral head. There is no evidence of dislocation. There is no evidence of arthropathy or other focal bone abnormality. Soft tissues are unremarkable. IMPRESSION: Findings suspicious for a  nondisplaced fracture of the greater tubercle of the right humeral head. Correlation with physical examination is recommended to determine the presence of point tenderness. Subsequent CT evaluation is recommended if acute fracture remains of clinical concern. Electronically Signed   By: Aram Candela M.D.   On: 02/10/2023 01:06    PROCEDURES and INTERVENTIONS:  Procedures  Medications - No data to display   IMPRESSION / MDM / ASSESSMENT AND PLAN / ED COURSE  I reviewed the triage vital signs and the nursing notes.  Differential diagnosis includes, but is not limited to, fracture, dislocation, sprain or strain  {Patient presents with symptoms of an acute illness or injury that is potentially life-threatening.  Patient presents after another fall with evidence of a humeral head fracture suitable for immobilization and outpatient management.  Suspect further ligamentous injury/sprain of her right ankle after this fall as well.  She is seeing podiatry later this morning already for an Achilles tendon repair.  X-rays are reassuring of this ankle and her finger on the left.  We provide her a sling and she is suitable for outpatient management.  She is eager to leave the ED to make it to her podiatry appointment this morning      FINAL CLINICAL IMPRESSION(S) / ED DIAGNOSES   Final diagnoses:  Sprain of right ankle, unspecified ligament, initial encounter  Fall, initial encounter  Closed fracture of head of right humerus, initial encounter     Rx / DC Orders   ED Discharge Orders     None        Note:  This document was prepared using Dragon voice recognition software and may include unintentional dictation errors.   Delton Prairie, MD 02/10/23 [phone]

## 2023-02-10 NOTE — Op Note (Signed)
Full Operative Report  Date of Operation: 9:14 AM, 02/10/2023   Patient: Tamara Morgan - 67 y.o. female  Surgeon: Pilar Plate, DPM   Assistant: None  Diagnosis: ACHILLES TENDON Rupture Right ankle  Procedure:  1. ***    Anesthesia: Anesthesia type not filed in the log.  No responsible provider has been recorded for the case.  No anesthesia staff entered.   Estimated Blood Loss: Minimal   Hemostasis: 1) Anatomical dissection, mechanical compression, electrocautery 2) Thigh tourniquet inflated to 300 mm Hg  Implants: * No implants in log *  Materials: ***  Injectables: 1) Pre-operatively: Pre op regional block per anesthesia 2) Post-operatively: None  Specimens: Pathology: None  Microbiology: None   Antibiotics: Clindamycin 900 mg Pre op  Drains: None  Complications: Patient tolerated the procedure well without complication.   Operative findings: As below in detailed report  Indications for Procedure: Tamara Morgan presents to Pilar Plate, DPM with a chief complaint of right achilles tendon rupture following recent retrocalc exostectomy and repair with speed bridge implant. The patient has failed conservative treatments of various modalities. At this time the patient has elected to proceed with surgical correction. All alternatives, risks, and complications of the procedures were thoroughly explained to the patient. Patient exhibits appropriate understanding of all discussion points and informed consent was signed and obtained in the chart with no guarantees to surgical outcome given or implied.  Description of Procedure: Patient was brought to the operating room. Patient transferred to OR table and placed in the prone position. The  procedure, and the surgical site were identified. anesthesia occurred as per anesthesia record.  The operative lower extremity as noted above was then prepped and draped in the usual sterile manner. The following  procedure then began.  ***  The surgical site was then dressed with ***. The patient tolerated both the procedure and anesthesia well with vital signs stable throughout. The patient was transferred in good condition and all vital signs stable  from the OR to recovery under the discretion of anesthesia.  Condition: Vital signs stable, neurovascular status unchanged from preoperative   Surgical plan:  ***  The patient will be *** in a *** to the operative limb until further instructed. The dressing is to remain clean, dry, and intact. Will continue to follow unless noted elsewhere.   Tamara Morgan, DPM [facility]

## 2023-02-10 NOTE — Anesthesia Postprocedure Evaluation (Signed)
Anesthesia Post Note  Patient: Nurse, mental health  Procedure(s) Performed: RIGHT FOOT TENDON TRANSFER WITH TENDON GRAFT APPLICATION (Right: Foot) ACHILLES TENDON REPAIR (Right: Foot)  Patient location during evaluation: PACU Anesthesia Type: General Level of consciousness: awake and alert Pain management: pain level controlled Vital Signs Assessment: post-procedure vital signs reviewed and stable Respiratory status: spontaneous breathing, nonlabored ventilation, respiratory function stable and patient connected to nasal cannula oxygen Cardiovascular status: blood pressure returned to baseline and stable Postop Assessment: no apparent nausea or vomiting Anesthetic complications: yes   Encounter Notable Events  Notable Event Outcome Phase Comment  Difficult to intubate - unexpected  Intraprocedure Filed from anesthesia note documentation.     Last Vitals:  Vitals:   02/10/23 1215 02/10/23 1315  BP: 119/60   Pulse: 60 60  Resp: 20 12  Temp: (!) 36 C   SpO2: 96% 98%    Last Pain:  Vitals:   02/10/23 1315  TempSrc:   PainSc: 7                  Corinda Gubler

## 2023-02-10 NOTE — H&P (Signed)
SURGICAL HISTORY and PHYSICAL FOOT & ANKLE SURGERY    Date Time: 02/10/23  7:26 AM Physician: Annamary Rummage DPM   History of Presenting Illness: Tamara Morgan is a 67 y.o. year old female presenting to [facility] for surgery on right ankle today. The patient complains of right heel pain that has been present for approximately 1 week. S/p Right retrocalcaneal exostectomy with achilles tendon repair. She unfortunately had difficulty maintaining NWB to the R foot and ended up rupturing her right achilles.   Of note patient had a fall last night and went to [facility] ED where she stayed until surgery. Concern for right humeral head fx non op.   Will plan to admit post op for PT/OT and placement in SNF due to issues maintaing NWB.   The patient has elected to pursue surgical management and understands the procedure, benefits, risks/complications, and alternatives to surgery. She has remained npo since MN. Otherwise feeling well today, no other complaints. Denies fever, chills, nausea, emesis, dyspnea, and chest pain.  Past Medical History: Past Medical History:  Diagnosis Date   Arrhythmia    Benign tumor    Cardiac anomaly    GERD (gastroesophageal reflux disease)    Hashimoto's disease    History of bone density study    Hyperlipidemia    Hypertension    Lyme disease    Malaria    OSA (obstructive sleep apnea)    Pap smear, as part of routine gynecological examination 2022    Past Surgical History: Past Surgical History:  Procedure Laterality Date   ABDOMINAL HYSTERECTOMY     CARDIAC CATHETERIZATION     COLONOSCOPY     several   ECTOPIC PREGNANCY SURGERY     OOPHORECTOMY Left    RECTOCELE REPAIR     VAGINAL HYSTERECTOMY      Allergies: Allergies  Allergen Reactions   Tramadol Other (See Comments)    Hyperanxiety  Panic attacks    Aspirin     Edema    Betadine [Povidone Iodine] Hives   Cephalosporins Diarrhea and Itching    Cephalexin Severe Itching   Codeine     Central  nervous system    Cortisone     Unknown   Doxycycline     Other Reaction(s): GI Intolerance, severe diarrhea   Gluten Meal     unknown   Mushroom Extract Complex (Do Not Select)     Fungi infection    Naproxen Swelling   Onion     Reflux   Prednisone     oral   Premarin [Conjugated Estrogens] Hives    Oral premarin    Tine Test [Old Tuberculin] Swelling   Tomato     Reflux   Valium [Diazepam] Nausea Only    Stop breathing    Versed [Midazolam] Nausea And Vomiting    Out of body experience    Wound Dressing Adhesive Hives   Iodine Rash    Home Medications: No current facility-administered medications for this encounter.   Medications Prior to Admission  Medication Sig Dispense Refill Last Dose/Taking   apixaban (ELIQUIS) 2.5 MG TABS tablet Take 1 tablet (2.5 mg total) by mouth 2 (two) times daily. (Patient taking differently: Take 5 mg by mouth 2 (two) times daily.) 60 tablet 0 Taking Differently   Azelastine HCl 137 MCG/SPRAY SOLN Place 1-2 sprays into the nose 2 (two) times daily as needed (allergies).   Taking As Needed   Bacillus Coagulans-Inulin (PROBIOTIC-PREBIOTIC PO) Take 2 capsules by mouth daily. Post biotic  3 complete   Taking   Berberine Chloride (BERBERINE HCI PO) Take 1,600 mg by mouth in the morning. 800 mg each   Taking   Carboxymethylcellul-Glycerin (REFRESH TEARS PF OP) Place 1 drop into both eyes daily as needed (Dry eye).   Taking As Needed   cetirizine (ZYRTEC) 10 MG tablet Take 10 mg by mouth daily as needed for allergies (Spring).   Taking As Needed   chlorhexidine (PERIDEX) 0.12 % solution Use as directed 15 mLs in the mouth or throat daily as needed (tongue).   Taking As Needed   clindamycin (CLEOCIN) 300 MG capsule Take 1 capsule (300 mg total) by mouth 2 (two) times daily for 5 days. 10 capsule 0 Taking   diphenhydrAMINE-PE-APAP (ALLERGY RELIEF PLUS SINUS PO) Take 25 mg by mouth at bedtime as needed (Congestion).   Taking As Needed   fluticasone  (FLONASE) 50 MCG/ACT nasal spray Place 2 sprays into both nostrils daily.   Taking   fluticasone (FLOVENT HFA) 110 MCG/ACT inhaler Inhale 2 puffs into the lungs as needed.   Taking As Needed   guaiFENesin-dextromethorphan (ROBITUSSIN DM) 100-10 MG/5ML syrup Take 5 mLs by mouth 3 (three) times daily as needed for cough. 118 mL 0 Taking As Needed   ibuprofen (ADVIL) 800 MG tablet Take 1 tablet (800 mg total) by mouth every 8 (eight) hours as needed. (Patient taking differently: Take 800 mg by mouth 2 (two) times daily.) 30 tablet 1 Taking Differently   losartan (COZAAR) 25 MG tablet Take 25 mg by mouth in the morning and at bedtime. Hold if dystolic is less than 115   Taking   Melatonin 10 MG TABS Take 20 mg by mouth at bedtime.   Taking   metoprolol succinate (TOPROL-XL) 25 MG 24 hr tablet Take 1 tablet (25 mg total) by mouth daily. 30 tablet 0 Taking   Misc Natural Products (TURMERIC CURCUMIN) CAPS Take 1 capsule by mouth 2 (two) times daily.   Taking   Multiple Minerals-Vitamins (CALCIUM-MAGNESIUM-ZINC-D3) TABS Take 1 tablet by mouth daily with supper. With /Vit k   Taking   Nutritional Supplements (COLD AND FLU PO) Take 2 tablets by mouth 2 (two) times daily as needed (Cold).   Taking As Needed   OVER THE COUNTER MEDICATION Take 1 tablet by mouth 2 (two) times daily. Joint 30 + advance supplement   Taking   OVER THE COUNTER MEDICATION Take 2 capsules by mouth in the morning. Thyroid hormone formulia   Taking   OVER THE COUNTER MEDICATION Take 1 capsule by mouth 2 (two) times daily. Immune Defence   Taking   OVER THE COUNTER MEDICATION Take 1 capsule by mouth in the morning. C CL balance Formula   Taking   OVER THE COUNTER MEDICATION Take 2 capsules by mouth daily with lunch. Lymph System   Taking   OVER THE COUNTER MEDICATION Take 3 capsules by mouth at bedtime. Calm Support   Taking   oxyCODONE-acetaminophen (PERCOCET/ROXICET) 5-325 MG tablet Take 1 tablet by mouth every 4 (four) hours as needed  for up to 5 days for severe pain (pain score 7-10). (Patient taking differently: Take 1 tablet by mouth at bedtime. Additional if needed during the afternoon) 20 tablet 0 Taking Differently   pantoprazole (PROTONIX) 40 MG tablet Take 1 tablet (40 mg total) by mouth every evening. 30 tablet 0 Taking   Phenylephrine-Acetaminophen (TYLENOL SINUS CONGESTION/PAIN PO) Take 1 tablet by mouth 2 (two) times daily as needed (Congestion).   Taking As  Needed   Povidone, PF, (IVIZIA DRY EYES) 0.5 % SOLN Place 2 drops into both eyes at bedtime as needed (Dry eyes).   Taking As Needed   prednisoLONE acetate (PRED FORTE) 1 % ophthalmic suspension Place 1 drop into the left eye daily.   Taking   Vitamin D-Vitamin K (K2-D3 MAX PO) Take 1 capsule by mouth daily.   Taking     Social History: @IPSOCHX @  Family History: Family History  Problem Relation Age of Onset   Hypertension Mother    Hypothyroidism Mother    Heart Problems Mother        Heart stent   Dementia Mother 63   Arthritis Mother    Arthritis Father    Cancer Father        TTR   Hypertension Father    Diabetes Father        type 2   Hypertension Daughter    Appendicitis Daughter    Hypertension Son     Review of Systems: As per HPI. A complete 10 point review of systems was performed and all other systems reviewed were negative.  Physical Exam: There were no vitals filed for this visit.  Lower Extremity Physical Exam  Vasc: CFT intact to R toes Neuro: SILT to toes R  MSK: edema and pain right ankle, right shoulder in sling Derm: Dressing C/D/i      Labs:  - Reviewed prior to examination  Radiology:  - Reviewed prior to examination  Assessment: Tamara Morgan is 67 y.o. female with right achilles tendon rupture s/p prior R heel spur resection and secondary achilles repair.    Plan: - Will proceed with surgical management including FHL tendon trasnfer revision repair of achilles. - Procedure, benefits, risks, and  alternatives discussed with patient and informed consent obtained. - Prophylactic antibiotics: Clindamycin 900 mg pre op. - Surgical site marked.  Jenelle Mages [place]

## 2023-02-10 NOTE — H&P (Addendum)
History and Physical    Patient: Tamara Morgan IRC:[id number] DOB: [birth year] DOA: 02/10/2023 DOS: the patient was seen and examined on 02/10/2023 PCP: Jackelyn Poling, DO  Patient coming from:  PACU  Chief Complaint: No chief complaint on file.  HPI: Junella Kocsis is a 67 y.o. female with medical history significant of atrial fibrillation, GERD, hyperlipidemia, hypertension, Hashimoto's presenting status post repair of right Achilles tendon.  Noted to have recently had right retrocalcaneal operative evaluation as well as Achilles tendon repair with podiatry.  She had difficulty maintaining nonweightbearing to the right foot and ended up rerupture in the right Achilles.  Patient also with noted subsequent fall and secondary right humerus fracture.  Patient had revision of right Achilles tendon repair today with Dr. Annamary Rummage.  Dr. Annamary Rummage reached out for admission for patient to receive evaluation for rehab.  At present, patient denies any chest pain, shortness of breath nausea or vomiting.  Does have a fair amount of pain in the right shoulder as well as right lower extremity.  No fevers or chills.  Noted atrial fibrillation and remote history of DVT on Eliquis.  Currently on hold perioperatively. Currently in PACU afebrile, hemodynamically stable.  On 2 L with O2 sats of 100%.  Right shoulder plain films with nondisplaced fracture of the greater tubercle of the right humeral head.  Sling in place.  Soft tissue swelling of left index finger.  Labs including CBC and CMP are pending. Review of Systems: As mentioned in the history of present illness. All other systems reviewed and are negative. Past Medical History:  Diagnosis Date   Arrhythmia    Benign tumor    Cardiac anomaly    GERD (gastroesophageal reflux disease)    Hashimoto's disease    History of bone density study    Hyperlipidemia    Hypertension    Lyme disease    Malaria    OSA (obstructive sleep apnea)    Pap smear, as  part of routine gynecological examination 2022   Past Surgical History:  Procedure Laterality Date   ABDOMINAL HYSTERECTOMY     CARDIAC CATHETERIZATION     COLONOSCOPY     several   ECTOPIC PREGNANCY SURGERY     OOPHORECTOMY Left    RECTOCELE REPAIR     VAGINAL HYSTERECTOMY     Social History:  reports that she has never smoked. She has been exposed to tobacco smoke. She has never used smokeless tobacco. She reports current alcohol use of about 1.0 - 2.0 standard drink of alcohol per week. She reports that she does not use drugs.  Allergies  Allergen Reactions   Tramadol Other (See Comments)    Hyperanxiety  Panic attacks    Aspirin     Edema    Betadine [Povidone Iodine] Hives   Cephalosporins Diarrhea and Itching    TOLERATED CEFAZOLIN 02/10/23 (Original allergy to cephalexin - itching)   Codeine     Central nervous system    Cortisone     Unknown   Doxycycline     Other Reaction(s): GI Intolerance, severe diarrhea   Gluten Meal     unknown   Mushroom Extract Complex (Do Not Select)     Fungi infection    Naproxen Swelling   Onion     Reflux   Prednisone     oral   Premarin [Conjugated Estrogens] Hives    Oral premarin    Tine Test [Old Tuberculin] Swelling   Tomato     Reflux  Valium [Diazepam] Nausea Only    Stop breathing    Versed [Midazolam] Nausea And Vomiting    Out of body experience    Wound Dressing Adhesive Hives   Iodine Rash    Family History  Problem Relation Age of Onset   Hypertension Mother    Hypothyroidism Mother    Heart Problems Mother        Heart stent   Dementia Mother 20   Arthritis Mother    Arthritis Father    Cancer Father        TTR   Hypertension Father    Diabetes Father        type 2   Hypertension Daughter    Appendicitis Daughter    Hypertension Son     Prior to Admission medications   Medication Sig Start Date End Date Taking? Authorizing Provider  apixaban (ELIQUIS) 2.5 MG TABS tablet Take 1 tablet (2.5  mg total) by mouth 2 (two) times daily. Patient taking differently: Take 5 mg by mouth 2 (two) times daily. 02/01/23  Yes Standiford, Jenelle Mages, DPM  Azelastine HCl 137 MCG/SPRAY SOLN Place 1-2 sprays into the nose 2 (two) times daily as needed (allergies).   Yes [provider]  Bacillus Coagulans-Inulin (PROBIOTIC-PREBIOTIC PO) Take 2 capsules by mouth daily. Post biotic 3 complete   Yes [provider]  Berberine Chloride (BERBERINE HCI PO) Take 1,600 mg by mouth in the morning. 800 mg each   Yes [provider]  chlorhexidine (PERIDEX) 0.12 % solution Use as directed 15 mLs in the mouth or throat daily as needed (tongue).   Yes [provider]  clindamycin (CLEOCIN) 300 MG capsule Take 1 capsule (300 mg total) by mouth 2 (two) times daily for 5 days. 02/06/23 02/11/23 Yes Standiford, Jenelle Mages, DPM  diphenhydrAMINE-PE-APAP (ALLERGY RELIEF PLUS SINUS PO) Take 25 mg by mouth at bedtime as needed (Congestion).   Yes [provider]  fluticasone (FLONASE) 50 MCG/ACT nasal spray Place 2 sprays into both nostrils daily.   Yes [provider]  fluticasone (FLOVENT HFA) 110 MCG/ACT inhaler Inhale 2 puffs into the lungs as needed.   Yes [provider]  guaiFENesin-dextromethorphan (ROBITUSSIN DM) 100-10 MG/5ML syrup Take 5 mLs by mouth 3 (three) times daily as needed for cough. 12/04/22  Yes Benjiman Core, MD  ibuprofen (ADVIL) 800 MG tablet Take 1 tablet (800 mg total) by mouth every 8 (eight) hours as needed. Patient taking differently: Take 800 mg by mouth 2 (two) times daily. 02/01/23  Yes Standiford, Jenelle Mages, DPM  losartan (COZAAR) 25 MG tablet Take 25 mg by mouth in the morning and at bedtime. Hold if dystolic is less than 115   Yes [provider]  Melatonin 10 MG TABS Take 20 mg by mouth at bedtime.   Yes [provider]  metoprolol succinate (TOPROL-XL) 25 MG 24 hr tablet Take 1 tablet (25 mg total) by  mouth daily. 08/11/20 02/09/23 Yes Gerhard Munch, MD  Misc Natural Products (TURMERIC CURCUMIN) CAPS Take 1 capsule by mouth 2 (two) times daily.   Yes [provider]  Nutritional Supplements (COLD AND FLU PO) Take 2 tablets by mouth 2 (two) times daily as needed (Cold).   Yes [provider]  OVER THE COUNTER MEDICATION Take 1 tablet by mouth 2 (two) times daily. Joint 30 + advance supplement   Yes [provider]  OVER THE COUNTER MEDICATION Take 2 capsules by mouth in the morning. Thyroid hormone formulia  Yes [provider]  OVER THE COUNTER MEDICATION Take 1 capsule by mouth 2 (two) times daily. Immune Defence   Yes [provider]  OVER THE COUNTER MEDICATION Take 1 capsule by mouth in the morning. C CL balance Formula   Yes [provider]  OVER THE COUNTER MEDICATION Take 2 capsules by mouth daily with lunch. Lymph System   Yes [provider]  OVER THE COUNTER MEDICATION Take 3 capsules by mouth at bedtime. Calm Support   Yes [provider]  oxyCODONE-acetaminophen (PERCOCET/ROXICET) 5-325 MG tablet Take 1 tablet by mouth every 4 (four) hours as needed for up to 5 days for severe pain (pain score 7-10). Patient taking differently: Take 1 tablet by mouth at bedtime. Additional if needed during the afternoon 02/06/23 02/11/23 Yes Standiford, Jenelle Mages, DPM  pantoprazole (PROTONIX) 40 MG tablet Take 1 tablet (40 mg total) by mouth every evening. 06/28/22  Yes Medina-Vargas, Monina C, NP  Phenylephrine-Acetaminophen (TYLENOL SINUS CONGESTION/PAIN PO) Take 1 tablet by mouth 2 (two) times daily as needed (Congestion).   Yes [provider]  Povidone, PF, (IVIZIA DRY EYES) 0.5 % SOLN Place 2 drops into both eyes at bedtime as needed (Dry eyes).   Yes [provider]  Vitamin D-Vitamin K (K2-D3 MAX PO) Take 1 capsule by mouth daily.   Yes [provider]  Carboxymethylcellul-Glycerin (REFRESH  TEARS PF OP) Place 1 drop into both eyes daily as needed (Dry eye).    [provider]  cetirizine (ZYRTEC) 10 MG tablet Take 10 mg by mouth daily as needed for allergies (Spring). Patient not taking: Reported on 02/10/2023    [provider]  Multiple Minerals-Vitamins (CALCIUM-MAGNESIUM-ZINC-D3) TABS Take 1 tablet by mouth daily with supper. With /Vit k Patient not taking: Reported on 02/10/2023    [provider]  prednisoLONE acetate (PRED FORTE) 1 % ophthalmic suspension Place 1 drop into the left eye daily. Patient not taking: Reported on 02/10/2023 12/19/22 03/19/23  [provider]    Physical Exam: Vitals:   02/10/23 1141 02/10/23 1145 02/10/23 1200 02/10/23 1215  BP: (!) 149/54 (!) 140/54 (!) 136/53 119/60  Pulse:  71 66 60  Resp: 20 19 13 20   Temp: (!) 97.5 F (36.4 C)   (!) 96.8 F (36 C)  TempSrc:      SpO2: 98% 97% 99% 96%  Weight:      Height:       Physical Exam Constitutional:      Appearance: She is obese.  HENT:     Head: Normocephalic and atraumatic.     Nose: Nose normal.  Eyes:     Pupils: Pupils are equal, round, and reactive to light.  Cardiovascular:     Rate and Rhythm: Normal rate and regular rhythm.  Pulmonary:     Effort: Pulmonary effort is normal.  Abdominal:     General: Bowel sounds are normal.  Musculoskeletal:     Comments: RUE in sling  R foot wrapped postoperatively  Skin:    General: Skin is warm.  Neurological:     General: No focal deficit present.  Psychiatric:        Mood and Affect: Mood normal.     Data Reviewed:  There are no new results to review at this time.  DG MINI C-ARM IMAGE ONLY There is no interpretation for this exam.    This order is for images obtained during a surgical procedure.  Please See  "Surgeries" Tab for  more information regarding the procedure. [place] OR NERVE BLOCK-IMAGE ONLY ([facility]) There is no interpretation for this exam.    This order is for images obtained  during a surgical procedure.  Please See  "Surgeries" Tab for more information regarding the procedure. DG Ankle Complete Right CLINICAL DATA:  Status post fall.  EXAM: RIGHT ANKLE - COMPLETE 3+ VIEW  COMPARISON:  February 03, 2023  FINDINGS: The right ankle was imaged in a fiberglass cast with subsequently obscured osseous and soft tissue detail. There is no evidence of an acute fracture, dislocation, or joint effusion. There is no evidence of arthropathy or other focal bone abnormality. Moderate severity diffuse soft tissue swelling is seen.  IMPRESSION: Moderate severity diffuse soft tissue swelling without evidence of an acute osseous abnormality.  Electronically Signed   By: Aram Candela M.D.   On: 02/10/2023 01:11 DG Finger Index Left CLINICAL DATA:  Status post fall.  EXAM: LEFT INDEX FINGER 2+V  COMPARISON:  None Available.  FINDINGS: There is no evidence of fracture or dislocation. Moderate severity degenerative changes seen involving the PIP and DIP joints. Mild diffuse soft tissue swelling is noted.  IMPRESSION: Mild diffuse soft tissue swelling without evidence of an acute osseous abnormality.  Electronically Signed   By: Aram Candela M.D.   On: 02/10/2023 01:08 DG Shoulder Right CLINICAL DATA:  Status post fall.  EXAM: RIGHT SHOULDER - 2+ VIEW  COMPARISON:  None Available.  FINDINGS: There is a tiny area of cortical irregularity seen along the greater tubercle of the right humeral head. There is no evidence of dislocation. There is no evidence of arthropathy or other focal bone abnormality. Soft tissues are unremarkable.  IMPRESSION: Findings suspicious for a nondisplaced fracture of the greater tubercle of the right humeral head. Correlation with physical examination is recommended to determine the presence of point tenderness. Subsequent CT evaluation is recommended if acute fracture remains of clinical concern.  Electronically  Signed   By: Aram Candela M.D.   On: 02/10/2023 01:06   Assessment and Plan: * S/P Achilles tendon repair Status post repair of right Achilles tendon rupture with podiatry Significant pain and difficulty with ambulation Tentative plan for PT OT evaluation for placement Pain control Otherwise follow-up further recommendations from Dr. Annamary Rummage  Humerus fracture Noted nondisplaced fracture of the greatertubercle of the right humeral head s/p fall  Sling in place  Case preliminarily discussed with Dr. Duayne Cal for outpatient follow-up Pain control     Atrial fibrillation (HCC) Baseline history of atrial fibrillation-cardiologist in the [facility] system per pt  Continue metoprolol AC on hold for now perioperatively     Primary hypertension BP stable  Titrate home regimen    Supraventricular tachycardia (HCC) S/p ablation  Cont home metoprolol    History of gastroesophageal reflux (GERD) PPI    OSA (obstructive sleep apnea) CPAP      Advance Care Planning:   Code Status: Full Code   Consults: Podiatry   Family Communication: No family at the bedside   Severity of Illness: The appropriate patient status for this patient is INPATIENT. Inpatient status is judged to be reasonable and necessary in order to provide the required intensity of service to ensure the patient's safety. The patient's presenting symptoms, physical exam findings, and initial radiographic and laboratory data in the context of their chronic comorbidities is felt to place them at high risk for further clinical deterioration. Furthermore, it is not anticipated that the patient will be medically stable for  discharge from the hospital within 2 midnights of admission.   * I certify that at the point of admission it is my clinical judgment that the patient will require inpatient hospital care spanning beyond 2 midnights from the point of admission due to high intensity of service, high risk for  further deterioration and high frequency of surveillance required.*  Author: Floydene Flock, MD 02/10/2023 12:43 PM  For on call review [email or web address].

## 2023-02-10 NOTE — Progress Notes (Signed)
       CROSS COVER NOTE  NAME: Tamara Morgan MRN: [id number] DOB : 07/06/1955    Concern as stated by nurse / staff   Message received from [facility] RN via secure chat  Pt is asking for CPAP to be set up on her room by RT.  Pt says that she has Sleep Apnea.    Pertinent findings on chart review: Patient follows with [facility] neurology and sleep for his sleep apnea last seen 01/02/2023. Use of nightly CPAP confirmed  Assessment and  Interventions   Assessment:  Plan: CPAP qHS       Donnie Mesa NP [facility] Cross Cover 7pm-7am - check amion for availability Pager [phone]

## 2023-02-10 NOTE — Assessment & Plan Note (Signed)
CPAP.  

## 2023-02-10 NOTE — Assessment & Plan Note (Addendum)
Noted nondisplaced fracture of the greatertubercle of the right humeral head s/p fall  Sling in place  Case preliminarily discussed with Dr. Duayne Cal for outpatient follow-up Pain control

## 2023-02-10 NOTE — Brief Op Note (Signed)
02/10/2023  11:42 AM  PATIENT:  Tamara Morgan  67 y.o. female  PRE-OPERATIVE DIAGNOSIS:  ACHILLES TENDON RIGHT HEEL PAIN  POST-OPERATIVE DIAGNOSIS:  ACHILLES TENDON RIGHT HEEL PAIN  PROCEDURE:  Procedure(s) with comments: RIGHT FOOT TENDON TRANSFER WITH TENDON GRAFT APPLICATION (Right) - BLOCK ACHILLES TENDON REPAIR (Right)  SURGEON:  Surgeons and Role:    * Joshu Furukawa, Jenelle Mages, DPM - Primary  PHYSICIAN ASSISTANT:   ASSISTANTS: none   ANESTHESIA:   regional and general  EBL:  30 mL   BLOOD ADMINISTERED:none  DRAINS: none   LOCAL MEDICATIONS USED:  NONE  SPECIMEN:  No Specimen  DISPOSITION OF SPECIMEN:  N/A  COUNTS:  YES  TOURNIQUET:   Total Tourniquet Time Documented: Thigh (Right) - 98 minutes Total: Thigh (Right) - 98 minutes   DICTATION: .Note written in EPIC  PLAN OF CARE: Admit to inpatient   PATIENT DISPOSITION:  PACU - hemodynamically stable.   Delay start of Pharmacological VTE agent (>24hrs) due to surgical blood loss or risk of bleeding: no

## 2023-02-10 NOTE — Assessment & Plan Note (Signed)
BP stable Titrate home regimen 

## 2023-02-11 ENCOUNTER — Inpatient Hospital Stay: Payer: Medicare Other

## 2023-02-11 DIAGNOSIS — S86011A Strain of right Achilles tendon, initial encounter: Secondary | ICD-10-CM | POA: Diagnosis not present

## 2023-02-11 DIAGNOSIS — Z9889 Other specified postprocedural states: Secondary | ICD-10-CM | POA: Diagnosis not present

## 2023-02-11 DIAGNOSIS — S86019A Strain of unspecified Achilles tendon, initial encounter: Secondary | ICD-10-CM | POA: Diagnosis present

## 2023-02-11 DIAGNOSIS — M25562 Pain in left knee: Secondary | ICD-10-CM | POA: Diagnosis not present

## 2023-02-11 LAB — COMPREHENSIVE METABOLIC PANEL
ALT: 30 U/L (ref 0–44)
AST: 31 U/L (ref 15–41)
Albumin: 3.4 g/dL — ABNORMAL LOW (ref 3.5–5.0)
Alkaline Phosphatase: 57 U/L (ref 38–126)
Anion gap: 8 (ref 5–15)
BUN: 15 mg/dL (ref 8–23)
CO2: 24 mmol/L (ref 22–32)
Calcium: 8.4 mg/dL — ABNORMAL LOW (ref 8.9–10.3)
Chloride: 105 mmol/L (ref 98–111)
Creatinine, Ser: 0.69 mg/dL (ref 0.44–1.00)
GFR, Estimated: >60 mL/min (ref >60)
Glucose, Bld: 138 mg/dL — ABNORMAL HIGH (ref 70–99)
Potassium: 3.9 mmol/L (ref 3.5–5.1)
Sodium: 137 mmol/L (ref 135–145)
Total Bilirubin: 0.5 mg/dL (ref <1.2)
Total Protein: 5.8 g/dL — ABNORMAL LOW (ref 6.5–8.1)

## 2023-02-11 LAB — CBC
HCT: 32.8 % — ABNORMAL LOW (ref 36.0–46.0)
Hemoglobin: 10.7 g/dL — ABNORMAL LOW (ref 12.0–15.0)
MCH: 29.4 pg (ref 26.0–34.0)
MCHC: 32.6 g/dL (ref 30.0–36.0)
MCV: 90.1 fL (ref 80.0–100.0)
Platelets: 259 10*3/uL (ref 150–400)
RBC: 3.64 MIL/uL — ABNORMAL LOW (ref 3.87–5.11)
RDW: 12.5 % (ref 11.5–15.5)
WBC: 9.3 10*3/uL (ref 4.0–10.5)
nRBC: 0 % (ref 0.0–0.2)

## 2023-02-11 MED ORDER — APIXABAN 5 MG PO TABS
5.0000 mg | ORAL_TABLET | Freq: Two times a day (BID) | ORAL | Status: DC
  Administered 2023-02-11 – 2023-02-14 (×6): 5 mg via ORAL
  Filled 2023-02-11 (×6): qty 1

## 2023-02-11 MED ORDER — PHENOL 1.4 % MT LIQD
1.0000 | OROMUCOSAL | Status: DC | PRN
  Administered 2023-02-11: 1 via OROMUCOSAL
  Filled 2023-02-11: qty 177

## 2023-02-11 NOTE — Care Management CC44 (Signed)
Condition Code 44 Documentation Completed  Patient Details  Name: Tamara Morgan MRN: [id number] Date of Birth: August 19, 1955   Condition Code 44 given:  Yes Patient signature on Condition Code 44 notice:  Yes Documentation of 2 MD's agreement:  Yes Code 44 added to claim:  Yes    Rodney Langton, RN 02/11/2023, 4:41 PM

## 2023-02-11 NOTE — TOC Initial Note (Signed)
Transition of Care [facility]) - Initial/Assessment Note    Patient Details  Name: Tamara Morgan MRN: [id number] Date of Birth: 31-Jul-1955  Transition of Care [facility]) CM/SW Contact:    Rodney Langton, RN Phone Number: 02/11/2023, 4:47 PM  Clinical Narrative:                  Patient admitted from home, lives with husband and godson.  Dr. Atha Starks is PCP, [facility] Has several DME in the home, including wheelchair, crutches, and knee scooter.  Aware of recommendations for SNF for short term rehab, agrees and prefers [facility] in [place], other than that, she prefers to stay in the [place] area, [id number].  TOC will start SNF workup.   Expected Discharge Plan: Skilled Nursing Facility Barriers to Discharge: Continued Medical Work up   Patient Goals and CMS Choice Patient states their goals for this hospitalization and ongoing recovery are:: SNF for short term rehab CMS Medicare.gov Compare Post Acute Care list provided to:: Patient Choice offered to / list presented to : Patient      Expected Discharge Plan and Services     Post Acute Care Choice: Skilled Nursing Facility Living arrangements for the past 2 months: Single Family Home                                      Prior Living Arrangements/Services Living arrangements for the past 2 months: Single Family Home Lives with:: Spouse, Adult Children Patient language and need for interpreter reviewed:: Yes Do you feel safe going back to the place where you live?: Yes      Need for Family Participation in Patient Care: Yes (Comment) Care giver support system in place?: Yes (comment) Current home services: DME Criminal Activity/Legal Involvement Pertinent to Current Situation/Hospitalization: No - Comment as needed  Activities of Daily Living   ADL Screening (condition at time of admission) Independently performs ADLs?: No Does the patient have a NEW difficulty with bathing/dressing/toileting/self-feeding  that is expected to last >3 days?: Yes (Initiates electronic notice to provider for possible OT consult) Does the patient have a NEW difficulty with getting in/out of bed, walking, or climbing stairs that is expected to last >3 days?: Yes (Initiates electronic notice to provider for possible PT consult) Does the patient have a NEW difficulty with communication that is expected to last >3 days?: No Is the patient deaf or have difficulty hearing?: No Does the patient have difficulty seeing, even when wearing glasses/contacts?: No Does the patient have difficulty concentrating, remembering, or making decisions?: No  Permission Sought/Granted   Permission granted to share information with : Yes, Verbal Permission Granted              Emotional Assessment              Admission diagnosis:  S/P Achilles tendon repair [A21.308] Achilles tendon rupture [S86.019A] Patient Active Problem List   Diagnosis Date Noted   Achilles tendon rupture 02/11/2023   S/P Achilles tendon repair 02/10/2023   Atrial fibrillation (HCC) 02/10/2023   Humerus fracture 02/10/2023   Personal history of venous thrombosis and embolism 09/08/2022   Abnormal SPEP 09/08/2022   OSA (obstructive sleep apnea) 06/17/2022   Hashimoto's thyroiditis 06/17/2022   History of gastroesophageal reflux (GERD) 06/17/2022   Varicose veins of bilateral lower extremities with other complications 06/17/2022   Supraventricular tachycardia (HCC) 06/17/2022   Primary hypertension 06/17/2022  PCP:  Jackelyn Poling, DO [facility]acy:   [facility] [address] [place] - [address] [place] [address] Phone: [phone] Fax: [phone]  [facility] [address] [place] [address] Phone: [phone] Fax: [phone]     Social Drivers of Health (SDOH) Social History: SDOH Screenings   Food Insecurity: No Food Insecurity (02/10/2023)   Housing: High Risk (02/10/2023)  Transportation Needs: No Transportation Needs (02/10/2023)  Utilities: Not At Risk (02/10/2023)  Depression (PHQ2-9): Low Risk  (04/14/2022)  Financial Resource Strain: Low Risk  (01/01/2023)   Received from [facility]  Physical Activity: Insufficiently Active (01/01/2023)   Received from [facility]  Social Connections: Socially Integrated (01/01/2023)   Received from [facility]  Stress: No Stress Concern Present (01/01/2023)   Received from [facility]  Tobacco Use: Low Risk  (02/10/2023)   SDOH Interventions:     Readmission Risk Interventions     No data to display

## 2023-02-11 NOTE — Care Management Obs Status (Signed)
MEDICARE OBSERVATION STATUS NOTIFICATION   Patient Details  Name: Tamara Morgan MRN: [id number] Date of Birth: 1955/08/10   Medicare Observation Status Notification Given:  No    Rodney Langton, RN 02/11/2023, 4:41 PM

## 2023-02-11 NOTE — Progress Notes (Signed)
PROGRESS NOTE    Tamara Morgan  ACZ:[id number] DOB: 1955/04/07 DOA: 02/10/2023 PCP: Tamara Poling, DO  No chief complaint on file.   Hospital Morgan:  Tamara Morgan is 67 y.o. female with history A-fib on request, history of DVT, GERD, hyperlipidemia, Hashimoto's, presents status post repair of right Achilles tendon with podiatry.  Patient previously had retrocalcaneal Achilles tendon repair with podiatry.  She had difficulty maintaining her nonweightbearing restrictions and ended up rerupture in the right Achilles tendon.  She also had a fall with a right humerus fracture.  She presents on this admission for revision of her right Achilles tendon repair with Tamara Morgan.  Postoperatively we were asked to evaluate the patient for admission and pursue inpatient rehab.  Subjective: This AM pt is complaining of left knee and ankle pain. Reports she was distracted by right ankle and shoulder pain previously but now concerned she may have fracture at other locations. Requesting additional x-rays.   Objective: Vitals:   02/10/23 1600 02/10/23 1623 02/10/23 2011 02/10/23 2030  BP: (!) 144/67 (!) 141/71 (!) 121/59 (!) 121/59  Pulse: 63 74 75 75  Resp: 16 17 20    Temp: 97.8 F (36.6 C) (!) 97.5 F (36.4 C) 98 F (36.7 C)   TempSrc:   Oral   SpO2: 98% 96% 92%   Weight:      Height:        Intake/Output Summary (Last 24 hours) at 02/11/2023 0737 Last data filed at 02/10/2023 1950 Gross per 24 hour  Intake 1526 ml  Output 210 ml  Net 1316 ml   Filed Weights   02/10/23 0738  Weight: 120.2 kg    Examination: General exam: Appears calm and comfortable, NAD  Respiratory system: No work of breathing, symmetric chest wall expansion Cardiovascular system: S1 & S2 heard, RRR.  Gastrointestinal system: Abdomen is nondistended, soft and nontender.  Neuro: Alert and oriented. No focal neurological deficits. Extremities: right leg bandaged, c/d/I. Right arm in sling. Left knee tender  to palpation over later tibial tuberosity. Left ankle mildly tender to palpation. No gross deformity, swelling, ecchymosis of left lower extremity Skin: No rashes, lesions Psychiatry: Demonstrates appropriate judgement and insight. Mood & affect appropriate for situation.   Assessment & Plan:  Principal Problem:   S/P Achilles tendon repair Active Problems:   Humerus fracture   Atrial fibrillation (HCC)   OSA (obstructive sleep apnea)   History of gastroesophageal reflux (GERD)   Supraventricular tachycardia (HCC)   Primary hypertension  Status post Achilles tendon repair, subsequent encounter  - 12/27 Achilles tendon repair with podiatry - Continue postoperative pain control - PT: 9, OT, 16. TOC consult for rehab - Further recommendations per podiatry  Humerus fracture - Nondisplaced fracture of the greater tubercle of right humeral head status post fall - Sling in place - Case discussed with Tamara Morgan - Outpatient follow-up with ortho - Pain control  Left Knee pain -- Pt believes she may have hit it when she fell. Xray ordered  Left Ankle Pain -- Pt bearing weight without issue. No deformity appreciated -- Monitor. Pain control. PT  Atrial fibrillation - Sees outpatient cardiology via [facility] - Continue with metoprolol - Chronically on Eliquis, resumed at home dose  Primary hypertension - Blood pressure stable - Continue home dose meds  History of supraventricular tachycardia - Status post ablation - Continue home dose metoprolol  GERD - Continue PPI  OSA - CPAP at night.  BMI 41 - Outpatient follow up for lifestyle modification  and risk factor management  Hyperglycemia -- HgbA1c 6.2% in Feb 2024. Repeat ordered     DVT prophylaxis: home dose Eliquis   Code Status: Full Code Family Communication: Daughter and grandchildren at bedside Disposition:  Status is: Obs code 44 per UM. TOC consult for rehab placement     Consultants:  Podiatry     Procedures:  Achilles tendon repair  Antimicrobials:  Anti-infectives (From admission, onward)    Start     Dose/Rate Route Frequency Ordered Stop   02/10/23 1800  ceFAZolin (ANCEF) IVPB 2g/100 mL premix        2 g 200 mL/hr over 30 Minutes Intravenous Every 8 hours 02/10/23 1151 02/11/23 1759   02/10/23 0745  clindamycin (CLEOCIN) IVPB 900 mg  Status:  Discontinued        900 mg 100 mL/hr over 30 Minutes Intravenous  Once 02/10/23 0732 02/10/23 1153       Data Reviewed: I have personally reviewed following labs and imaging studies CBC: Recent Labs  Lab 02/10/23 1310 02/11/23 0448  WBC 7.9 9.3  NEUTROABS 6.1  --   HGB 11.8* 10.7*  HCT 36.9 32.8*  MCV 90.2 90.1  PLT 216 259   Basic Metabolic Panel: Recent Labs  Lab 02/10/23 1310 02/11/23 0448  NA 139 137  K 4.5 3.9  CL 108 105  CO2 20* 24  GLUCOSE 158* 138*  BUN 17 15  CREATININE 0.78 0.69  CALCIUM 8.8* 8.4*   GFR: Estimated Creatinine Clearance: 89.1 mL/min (by C-G formula based on SCr of 0.69 mg/dL). Liver Function Tests: Recent Labs  Lab 02/10/23 1310 02/11/23 0448  AST 42* 31  ALT 36 30  ALKPHOS 63 57  BILITOT 0.5 0.5  PROT 6.5 5.8*  ALBUMIN 3.7 3.4*   CBG: No results for input(s): "GLUCAP" in the last 168 hours.  No results found for this or any previous visit (from the past 240 hours).   Radiology Studies: DG Ankle 2 Views Right Result Date: 02/10/2023 CLINICAL DATA:  Right Achilles tendon repair EXAM: RIGHT ANKLE - 2 VIEW COMPARISON:  02/10/2023 12:55 a.m. FINDINGS: Frontal and lateral views of the right ankle are obtained. Interval removal of cast material. Postsurgical changes from calcaneal osteotomy again noted. No acute fracture, subluxation, or dislocation. Joint spaces are relatively well preserved. There is diffuse soft tissue edema unchanged. IMPRESSION: 1. Diffuse soft tissue edema. 2. Postsurgical changes of the right calcaneus.  No acute fracture. Electronically Signed   By:  Tamara Morgan M.D.   On: 02/10/2023 16:05   DG MINI C-ARM IMAGE ONLY Result Date: 02/10/2023 There is no interpretation for this exam.  This order is for images obtained during a surgical procedure.  Please See "Surgeries" Tab for more information regarding the procedure.   [place] OR NERVE BLOCK-IMAGE ONLY [facility]) Result Date: 02/10/2023 There is no interpretation for this exam.  This order is for images obtained during a surgical procedure.  Please See "Surgeries" Tab for more information regarding the procedure.   DG Ankle Complete Right Result Date: 02/10/2023 CLINICAL DATA:  Status post fall. EXAM: RIGHT ANKLE - COMPLETE 3+ VIEW COMPARISON:  February 03, 2023 FINDINGS: The right ankle was imaged in a fiberglass cast with subsequently obscured osseous and soft tissue detail. There is no evidence of an acute fracture, dislocation, or joint effusion. There is no evidence of arthropathy or other focal bone abnormality. Moderate severity diffuse soft tissue swelling is seen. IMPRESSION: Moderate severity diffuse soft tissue swelling without evidence of  an acute osseous abnormality. Electronically Signed   By: Aram Candela M.D.   On: 02/10/2023 01:11   DG Finger Index Left Result Date: 02/10/2023 CLINICAL DATA:  Status post fall. EXAM: LEFT INDEX FINGER 2+V COMPARISON:  None Available. FINDINGS: There is no evidence of fracture or dislocation. Moderate severity degenerative changes seen involving the PIP and DIP joints. Mild diffuse soft tissue swelling is noted. IMPRESSION: Mild diffuse soft tissue swelling without evidence of an acute osseous abnormality. Electronically Signed   By: Aram Candela M.D.   On: 02/10/2023 01:08   DG Shoulder Right Result Date: 02/10/2023 CLINICAL DATA:  Status post fall. EXAM: RIGHT SHOULDER - 2+ VIEW COMPARISON:  None Available. FINDINGS: There is a tiny area of cortical irregularity seen along the greater tubercle of the right humeral head. There is no  evidence of dislocation. There is no evidence of arthropathy or other focal bone abnormality. Soft tissues are unremarkable. IMPRESSION: Findings suspicious for a nondisplaced fracture of the greater tubercle of the right humeral head. Correlation with physical examination is recommended to determine the presence of point tenderness. Subsequent CT evaluation is recommended if acute fracture remains of clinical concern. Electronically Signed   By: Aram Candela M.D.   On: 02/10/2023 01:06    Scheduled Meds:  apixaban  2.5 mg Oral BID   metoprolol succinate  25 mg Oral Daily   pantoprazole  40 mg Oral QPM   Continuous Infusions:   ceFAZolin (ANCEF) IV 2 g (02/11/23 0116)     LOS: 1 day    Time spent:   Debarah Crape, DO [facility]  To contact the attending physician between 7A-7P please use Epic Chat. To contact the covering physician during after hours 7P-7A, please review Amion.   02/11/2023, 7:37 AM   *This document has been created with the assistance of dictation software. Please excuse typographical errors. *

## 2023-02-11 NOTE — Evaluation (Signed)
Occupational Therapy Evaluation Patient Details Name: Tamara Morgan MRN: [id number] DOB: Jul 11, 1955 Today's Date: 02/11/2023   History of Present Illness Pt is a 67 year old female s/p revision R achilles tendon repair 01/31/23; RLE NWBing   PMH signficant for atrial fibrillation, GERD, hyperlipidemia, hypertension, Hashimoto's; She had difficulty maintaining nonweightbearing to the right foot and ended up rerupture in the right Achilles.  Patient also with noted subsequent fall and secondary right humerus fracture.   Clinical Impression   Chart reviewed to date, pt in room with PT and nurse at start of OT evaluation. Co tx completed. Pt is alert and oriented x4, reports significant deficits in ADL performance/ mobility in the house after initial surgery on 12/18, resulting in a fall and requiring additional surgery. Pt presents with deficits in activity tolerance, strength, balance, RUE/RLE function affecting safe and optimal ADL completion. MAX A +2-3 (1 person to support RLE, +2 for lateral scoot) required for lateral scoot to drop arm bedside commode with frequent vcs for technique. MAX A required for toileting, LB dressing, upper body dressing. Hoyer lift utilized for pt to return to bed from drop arm bedside commode. Appropriate adherence to NWBIng precautions with support of the RLE during mobility. Pt is performing ADL below PLOF, will benefit from skilled OT to address functional deficits and to facilitate optimal ADL performance. OT will continue to follow acutely.       If plan is discharge home, recommend the following: Two people to help with walking and/or transfers;Two people to help with bathing/dressing/bathroom;Help with stairs or ramp for entrance;Assist for transportation;Assistance with cooking/housework    Functional Status Assessment  Patient has had a recent decline in their functional status and demonstrates the ability to make significant improvements in function in a  reasonable and predictable amount of time.  Equipment Recommendations  Other (comment) (defer to next venue of care)    Recommendations for Other Services       Precautions / Restrictions Precautions Precautions: Fall Restrictions Weight Bearing Restrictions Per Provider Order: Yes RUE Weight Bearing Per Provider Order:  (in sling, treated at NWBing, provider messaged) RLE Weight Bearing Per Provider Order: Non weight bearing      Mobility Bed Mobility                    Transfers Overall transfer level: Needs assistance   Transfers: Bed to chair/wheelchair/BSC            Lateral/Scoot Transfers: Max assist (+2-3) General transfer comment: +1 to support RLE during lateral scoot, +2 for lateral scoot with frequent vcs for technique; used hoyer lift from drop arm bedside commode to return to bed Transfer via Lift Equipment:  (Viking lift)    Balance Overall balance assessment: Needs assistance Sitting-balance support: Single extremity supported Sitting balance-Leahy Scale: Good       Standing balance-Leahy Scale: Zero                             ADL either performed or assessed with clinical judgement   ADL Overall ADL's : Needs assistance/impaired Eating/Feeding: Set up;Sitting   Grooming: Minimal assistance;Sitting   Upper Body Bathing: Moderate assistance Upper Body Bathing Details (indicate cue type and reason): anticipate Lower Body Bathing: Maximal assistance Lower Body Bathing Details (indicate cue type and reason): anticipate Upper Body Dressing : Sitting;Maximal assistance Upper Body Dressing Details (indicate cue type and reason): sling Lower Body Dressing: Maximal assistance Lower Body  Dressing Details (indicate cue type and reason): L sock and shoe Toilet Transfer: Maximal assistance;Requires wide/bariatric (+3 for safety and assist (holding drop arm bsc)) Toilet Transfer Details (indicate cue type and reason): to drop arm  bedside commode, frequent vcs for technique Toileting- Clothing Manipulation and Hygiene: Maximal assistance;Sitting/lateral lean               Vision Patient Visual Report: No change from baseline (recent eye surgery, needs glasses for reading) Vision Assessment?: Wears glasses for reading     Perception         Praxis         Pertinent Vitals/Pain Pain Assessment Pain Assessment: 0-10 Pain Score: 5  Pain Location: RLE Pain Descriptors / Indicators: Discomfort Pain Intervention(s): Limited activity within patient's tolerance, Monitored during session, Repositioned     Extremity/Trunk Assessment Upper Extremity Assessment Upper Extremity Assessment: RUE deficits/detail RUE Deficits / Details: generalized weakness throughout, RUE in sling RUE: Unable to fully assess due to immobilization   Lower Extremity Assessment Lower Extremity Assessment: Defer to PT evaluation       Communication Communication Communication: No apparent difficulties Cueing Techniques: Verbal cues   Cognition Arousal: Alert Behavior During Therapy: WFL for tasks assessed/performed Overall Cognitive Status: Within Functional Limits for tasks assessed                                       General Comments  RLE splint intact pre/post session    Exercises Other Exercises Other Exercises: edu re: role of OT, role of rehab, discharge recommendaitons, sling managment, ADL performance within precautions   Shoulder Instructions      Home Living Family/patient expects to be discharged to:: Private residence Living Arrangements: Spouse/significant other;Other relatives (adult godson who assists with ADL/IADL as needed) Available Help at Discharge: Family Type of Home: House Home Access: Stairs to enter Secretary/administrator of Steps: 1+1   Home Layout: One level     Bathroom Shower/Tub: Chief Strategy Officer:  (uses bsc over toilet) Bathroom Accessibility:  No   Home Equipment: BSC/3in1;Wheelchair - manual   Additional Comments: has knee walker, crutches      Prior Functioning/Environment Prior Level of Function : Needs assist;History of Falls (last six months)             Mobility Comments: limited household mobility with AD after surgery on 12/18 ADLs Comments: MOD I-I in ADL piror to sugery on dec 18, assist for all ADL/IADL after surgery        OT Problem List: Decreased strength;Decreased activity tolerance;Decreased knowledge of use of DME or AE;Decreased safety awareness;Impaired balance (sitting and/or standing);Decreased knowledge of precautions      OT Treatment/Interventions: Self-care/ADL training;Therapeutic exercise;Patient/family education;Balance training;Energy conservation;Therapeutic activities;DME and/or AE instruction    OT Goals(Current goals can be found in the care plan section) Acute Rehab OT Goals Patient Stated Goal: rehab OT Goal Formulation: With patient Time For Goal Achievement: 02/25/23 Potential to Achieve Goals: Good ADL Goals Pt Will Perform Grooming: sitting;with modified independence Pt Will Perform Lower Body Dressing: with set-up;sitting/lateral leans Pt Will Transfer to Toilet: with min assist;bedside commode Pt Will Perform Toileting - Clothing Manipulation and hygiene: with min assist;sitting/lateral leans  OT Frequency: Min 1X/week    Co-evaluation PT/OT/SLP Co-Evaluation/Treatment: Yes Reason for Co-Treatment: To address functional/ADL transfers;For patient/therapist safety;Complexity of the patient's impairments (multi-system involvement)   OT goals addressed during session:  ADL's and self-care;Proper use of Adaptive equipment and DME      AM-PAC OT "6 Clicks" Daily Activity     Outcome Measure Help from another person eating meals?: None Help from another person taking care of personal grooming?: None Help from another person toileting, which includes using toliet, bedpan, or  urinal?: A Lot Help from another person bathing (including washing, rinsing, drying)?: A Lot Help from another person to put on and taking off regular upper body clothing?: A Lot Help from another person to put on and taking off regular lower body clothing?: A Lot 6 Click Score: 16   End of Session Equipment Utilized During Treatment: Gait belt;Other (comment) (drop arm bedside commode) Nurse Communication: Mobility status;Need for lift equipment  Activity Tolerance: Patient tolerated treatment well Patient left: in bed;with call bell/phone within reach;with bed alarm set  OT Visit Diagnosis: Other abnormalities of gait and mobility (R26.89);Unsteadiness on feet (R26.81);Muscle weakness (generalized) (M62.81);History of falling (Z91.81)                Time: 6440-3474 OT Time Calculation (min): 33 min Charges:  OT General Charges $OT Visit: 1 Visit OT Evaluation $OT Eval Moderate Complexity: 1 Mod  Oleta Mouse, OTD OTR/L  02/11/23, 11:24 AM

## 2023-02-11 NOTE — Plan of Care (Signed)

## 2023-02-11 NOTE — Progress Notes (Signed)
   02/11/23 1500  Spiritual Encounters  Type of Visit Initial  Care provided to: Patient  Referral source Nurse (RN/NT/LPN)  Reason for visit Advance directives  OnCall Visit Yes  Interventions  Spiritual Care Interventions Made Compassionate presence;Established relationship of care and support  Intervention Outcomes  Outcomes Awareness of support;Connection to spiritual care  Spiritual Care Plan  Spiritual Care Issues Still Outstanding No further spiritual care needs at this time (see row info)   Spiritual Consult for AD patient has spouse they have talked and have a verbal plan. Let patient know that we are here for them if anything was to change.

## 2023-02-11 NOTE — Evaluation (Signed)
Physical Therapy Evaluation Patient Details Name: Tamara Morgan MRN: [id number] DOB: Jul 14, 1955 Today's Date: 02/11/2023  History of Present Illness  Pt is a 67 year old female s/p revision R achilles tendon repair 02/10/23 (and FHL tendon transfer to posterior calcaneus); RLE NWBing.  Of note, pt had difficulty maintaining nonweightbearing to the right foot prior to this surgery (ended up rerupturing the right Achilles and requiring most recent surgery). Prior to most recent surgery, pt also with noted subsequent fall and secondary right humerus fracture (nondisplaced fx of greater tubercle of R humeral head; sling in place).   PMH signficant for atrial fibrillation, GERD, hyperlipidemia, hypertension, Hashimoto's, OSA.  Clinical Impression  PT/OT co-evaluation performed.  Prior to this most recent surgery, pt was limited with household functional mobility (d/t previous surgery) but was independent with functional mobility prior to that.  Pt lives with family in 1 level home with steps to enter.  Currently pt is 2-3 assist with lateral scoot transfer to R recliner to drop arm BSC; utilized hoyer lift to transfer pt from drop arm BSC back to bed.  Pt would currently benefit from skilled PT to address noted impairments and functional limitations (see below for any additional details).  Upon hospital discharge, pt would benefit from ongoing therapy.     If plan is discharge home, recommend the following: Two people to help with walking and/or transfers;Two people to help with bathing/dressing/bathroom;Assistance with cooking/housework;Assist for transportation;Help with stairs or ramp for entrance   Can travel by private vehicle   No    Equipment Recommendations BSC/3in1;Wheelchair (measurements PT);Wheelchair cushion (measurements PT);Hospital bed;Hoyer lift  Recommendations for Other Services       Functional Status Assessment Patient has had a recent decline in their functional status and  demonstrates the ability to make significant improvements in function in a reasonable and predictable amount of time.     Precautions / Restrictions Precautions Precautions: Fall Restrictions Weight Bearing Restrictions Per Provider Order: Yes RUE Weight Bearing Per Provider Order:  (in sling; treated as NWB'ing; OT messaged provider) RLE Weight Bearing Per Provider Order: Non weight bearing      Mobility  Bed Mobility               General bed mobility comments: Pt hoyered from [facility] to bed end of session; (pt in recliner upon PT arrival).    Transfers Overall transfer level: Needs assistance Equipment used: None Transfers: Bed to chair/wheelchair/BSC            Lateral/Scoot Transfers: Max assist (+2-3) General transfer comment: +1 to support RLE during lateral scoot; +2 for lateral scoot (recliner to drop arm BSC) with frequent vcs for technique (transfer towards R side); used hoyer lift from drop arm bedside commode to return to bed Transfer via Lift Equipment:  (Viking lift)  Ambulation/Gait               General Gait Details: Not appropriate at this time  Acupuncturist Bed    Modified Rankin (Stroke Patients Only)       Balance Overall balance assessment: Needs assistance Sitting-balance support: No upper extremity supported, Feet unsupported Sitting balance-Leahy Scale: Good Sitting balance - Comments: steady reaching within BOS with L UE     Standing balance-Leahy Scale: Zero Standing balance comment: Not appropriate to attempt at this time  Pertinent Vitals/Pain Pain Assessment Pain Assessment: 0-10 Pain Score: 5  Pain Location: RLE Pain Descriptors / Indicators: Discomfort Pain Intervention(s): Limited activity within patient's tolerance, Monitored during session, Premedicated before session, Repositioned, Other (comment) (R LE elevated on pillow) Vitals  (HR and SpO2 on room air) stable and WFL throughout treatment session.    Home Living Family/patient expects to be discharged to:: Private residence Living Arrangements: Spouse/significant other;Other relatives (Adult godson who assists with ADL/IADL as needed) Available Help at Discharge: Family Type of Home: House Home Access: Stairs to enter   Secretary/administrator of Steps: 1+1   Home Layout: One level Home Equipment: BSC/3in1;Wheelchair - manual Additional Comments: has knee walker; crutches    Prior Function Prior Level of Function : Needs assist;History of Falls (last six months)             Mobility Comments: Limited household mobility with AD after surgery on 12/18; independent with mobility prior to that ADLs Comments: MOD I-I in ADL piror to sugery on dec 18; assist for all ADL/IADL after surgery     Extremity/Trunk Assessment   Upper Extremity Assessment Upper Extremity Assessment: Defer to OT evaluation RUE Deficits / Details: Per OT eval "generalized weakness throughout, RUE in sling" RUE: Unable to fully assess due to immobilization    Lower Extremity Assessment Lower Extremity Assessment: LLE deficits/detail;RLE deficits/detail RLE Deficits / Details: at least 3/5 AROM hip flexion and knee flexion/extension; R ankle in splint RLE: Unable to fully assess due to immobilization LLE Deficits / Details: generalized weakness    Cervical / Trunk Assessment Cervical / Trunk Assessment: Normal  Communication   Communication Communication: No apparent difficulties Cueing Techniques: Verbal cues  Cognition Arousal: Alert Behavior During Therapy: WFL for tasks assessed/performed Overall Cognitive Status: Within Functional Limits for tasks assessed                                          General Comments General comments (skin integrity, edema, etc.): R LE splint intact pre/post session.  Nursing cleared pt for participation in physical  therapy.  Pt agreeable to PT/OT session.  Pt in recliner Corene Cornea in front of pt with nurse and NT present) upon PT arrival--nursing reports pt needing to use [facility]; (nurse reports using Corene Cornea to get pt from bed to recliner earlier today (by elevating bed height, pt pulling up with L UE, keeping R LE NWB'ing) but unable to get pt out of the low recliner/chair with Corene Cornea).  PT educated nursing staff to use hoyer lift for all transfers instead.  Pt able to maintain NWB'ing precautions with assist during session.    Exercises  Transfer training   Assessment/Plan    PT Assessment Patient needs continued PT services  PT Problem List Decreased strength;Decreased range of motion;Decreased activity tolerance;Decreased balance;Decreased mobility;Decreased knowledge of use of DME;Decreased knowledge of precautions;Decreased skin integrity;Pain       PT Treatment Interventions DME instruction;Functional mobility training;Therapeutic activities;Therapeutic exercise;Balance training;Patient/family education;Wheelchair mobility training    PT Goals (Current goals can be found in the Care Plan section)  Acute Rehab PT Goals Patient Stated Goal: to improve functional mobility PT Goal Formulation: With patient Time For Goal Achievement: 02/25/23 Potential to Achieve Goals: Good    Frequency 7X/week     Co-evaluation PT/OT/SLP Co-Evaluation/Treatment: Yes Reason for Co-Treatment: To address functional/ADL transfers;For patient/therapist safety;Complexity of the patient's impairments (multi-system  involvement) PT goals addressed during session: Mobility/safety with mobility;Balance;Proper use of DME OT goals addressed during session: ADL's and self-care;Proper use of Adaptive equipment and DME       AM-PAC PT "6 Clicks" Mobility  Outcome Measure Help needed turning from your back to your side while in a flat bed without using bedrails?: A Little Help needed moving from lying on your back  to sitting on the side of a flat bed without using bedrails?: A Lot Help needed moving to and from a bed to a chair (including a wheelchair)?: Total Help needed standing up from a chair using your arms (e.g., wheelchair or bedside chair)?: Total Help needed to walk in hospital room?: Total Help needed climbing 3-5 steps with a railing? : Total 6 Click Score: 9    End of Session Equipment Utilized During Treatment: Gait belt Activity Tolerance: Patient tolerated treatment well Patient left: in bed;with call bell/phone within reach;with bed alarm set;Other (comment) (R UE and B LE's elevated via pillows) Nurse Communication: Mobility status;Need for lift equipment;Precautions;Weight bearing status (discussed with pt's nurse; written on dry erase board in pt's room) PT Visit Diagnosis: Other abnormalities of gait and mobility (R26.89);Muscle weakness (generalized) (M62.81);History of falling (Z91.81);Pain Pain - Right/Left: Right Pain - part of body: Ankle and joints of foot;Shoulder    Time: 1015-1056 PT Time Calculation (min) (ACUTE ONLY): 41 min   Charges:   PT Evaluation $PT Eval Low Complexity: 1 Low PT Treatments $Therapeutic Activity: 8-22 mins PT General Charges $$ ACUTE PT VISIT: 1 Visit        Hendricks Limes, PT 02/11/23, 1:59 PM

## 2023-02-12 DIAGNOSIS — S86011D Strain of right Achilles tendon, subsequent encounter: Secondary | ICD-10-CM

## 2023-02-12 DIAGNOSIS — Z9889 Other specified postprocedural states: Secondary | ICD-10-CM | POA: Diagnosis not present

## 2023-02-12 DIAGNOSIS — S86011A Strain of right Achilles tendon, initial encounter: Secondary | ICD-10-CM | POA: Diagnosis not present

## 2023-02-12 MED ORDER — POLYETHYLENE GLYCOL 3350 17 G PO PACK
17.0000 g | PACK | Freq: Two times a day (BID) | ORAL | Status: DC
Start: 2023-02-12 — End: 2023-02-14
  Administered 2023-02-12 – 2023-02-13 (×2): 17 g via ORAL
  Filled 2023-02-12 (×4): qty 1

## 2023-02-12 MED ORDER — DOCUSATE SODIUM 100 MG PO CAPS
100.0000 mg | ORAL_CAPSULE | Freq: Two times a day (BID) | ORAL | Status: DC
  Administered 2023-02-12 – 2023-02-13 (×3): 100 mg via ORAL
  Filled 2023-02-12 (×4): qty 1

## 2023-02-12 NOTE — Progress Notes (Signed)
°  Subjective:  Patient ID: Tamara Morgan, female    DOB: [birth year],  MRN: [id number]   DOS: 02/10/2023 Procedure: 1.  FHL tendon transfer to posterior calcaneus, right ankle 2.  Repair of Achilles tendon rupture, right ankle  67 y.o. female seen for post op check.  Patient seen postop day 2 status post above procedures.  She is resting in bed.  She says pain is Well-controlled at this time.  Has multiple injuries aside from the recent surgery to her right ankle including right shoulder injury necessitating immobilization in a sling.  Also has bone bruise of her left knee which hurts but is not hurting at this time.  She is able to weight-bear on the left side she reports.  Discussed plan for rehab/nursing facility and she is agreeable.  Discussed follow-up plans.  Reinforced need for strict nonweightbearing to the right lower extremity to allow for healing and prevent rerupture.  Review of Systems: Negative except as noted in the HPI. Denies N/V/F/Ch.   Objective:   Vitals:   02/11/23 2049 02/12/23 0748  BP: (!) 130/53 (!) 144/64  Pulse: 74 (!) 54  Resp:    Temp:  97.8 F (36.6 C)  SpO2:  91%   Body mass index is 41.5 kg/m. Constitutional Well developed. Well nourished.  Vascular Foot warm and well perfused. Capillary refill normal to all digits.   No calf pain with palpation  Neurologic Normal speech. Oriented to person, place, and time. Epicritic sensation intact to toes  Dermatologic Right second toe healing well with sutures intact no evidence of infection at the DIPJ surgery from initial surgery.  Splint dressing with the foot in plantarflexion clean dry and intact  Orthopedic: Wiggles toes and sensation intact to all toes.  Absent hallux plantarflexion as expected   Radiographs: Postsurgical changes including posterior calcaneal exostectomy no acute fracture identified  Pathology: N/A  Micro: N/A  Assessment:   Right Achilles tendon rupture status post prior  retrocalcaneal exostectomy with secondary repair of Achilles now status post revision tendon repair and FHL tendon transfer  Plan:  Patient was evaluated and treated and all questions answered.  POD # 2 s/p FHL tendon transfer and right Achilles tendon rupture repair -Progressing well postop she has maintain nonweightbearing in the hospital and says pain is controlled -Plan for patient to go to nursing facility for safety as she is not able to maintain nonweightbearing status to the right lower extremity at home -XR: Expected postop changes -WB Status: Nonweightbearing in posterior splint to the right lower extremity -Sutures: To remain intact. -Medications/ABX: Recommend 5 days of cephalexin or clindamycin upon discharge.   - Return on Thursday, February 16, 2023 for postop wound check and splint change -Will sign off remainder of care per primary and case management        Corinna Gab, DPM [facility] / [facility]

## 2023-02-12 NOTE — NC FL2 (Signed)
[place] MEDICAID FL2 LEVEL OF CARE FORM     IDENTIFICATION  Patient Name: Tamara Morgan Birthdate: May 06, 1955 Sex: female Admission Date (Current Location): 02/10/2023  [facility] and [place] Number:  Chiropodist and Address:  [facility], [address] [place], [place] [address]      Provider Number: [id number]  Attending Physician Name and Address:  Debarah Crape, DO  Relative Name and Phone Number:  [name], [name] (Spouse)  [phone]    Current Level of Care: Hospital Recommended Level of Care: Skilled Nursing Facility Prior Approval Number:    Date Approved/Denied:   PASRR Number: [id number]  Discharge Plan: SNF    Current Diagnoses: Patient Active Problem List   Diagnosis Date Noted   Achilles tendon rupture, right, subsequent encounter 02/12/2023   Achilles tendon rupture 02/11/2023   S/P Achilles tendon repair 02/10/2023   Atrial fibrillation (HCC) 02/10/2023   Humerus fracture 02/10/2023   Personal history of venous thrombosis and embolism 09/08/2022   Abnormal SPEP 09/08/2022   OSA (obstructive sleep apnea) 06/17/2022   Hashimoto's thyroiditis 06/17/2022   History of gastroesophageal reflux (GERD) 06/17/2022   Varicose veins of bilateral lower extremities with other complications 06/17/2022   Supraventricular tachycardia (HCC) 06/17/2022   Primary hypertension 06/17/2022    Orientation RESPIRATION BLADDER Height & Weight     Self, Time, Situation, Place  Normal Continent Weight: 120.2 kg Height:  5\' 7"  (170.2 cm)  BEHAVIORAL SYMPTOMS/MOOD NEUROLOGICAL BOWEL NUTRITION STATUS      Continent Diet  AMBULATORY STATUS COMMUNICATION OF NEEDS Skin   Extensive Assist Verbally Surgical wounds                       Personal Care Assistance Level of Assistance  Bathing, Dressing Bathing Assistance: Limited assistance   Dressing Assistance: Maximum assistance     Functional Limitations Info              SPECIAL CARE FACTORS FREQUENCY  PT (By licensed PT), OT (By licensed OT)     PT Frequency: 5 days a week OT Frequency: 5 days a week            Contractures Contractures Info: Not present    Additional Factors Info  Code Status, Allergies Code Status Info: Full Allergies Info: Tramadol Medium  Other (See Comments) Hyperanxiety Panic attacks  Aspirin Not Specified   Edema  Betadine (povidone Iodine) Not Specified  Hives   Cephalosporins Not Specified  Diarrhea, Itching TOLERATED CEFAZOLIN 02/10/23 (Original allergy to cephalexin - itching)  Codeine Not Specified   Central nervous system  Cortisone Not Specified   Unknown  Doxycycline Not Specified   Other Reaction(s): GI Intolerance, severe diarrhea  Gluten Meal Not Specified   unknown  Mushroom Extract Complex (do Not Select) Not Specified   Fungi infection  Naproxen Not Specified  Swelling   Onion Not Specified   Reflux  Prednisone Not Specified   oral  Premarin (conjugated Estrogens) Not Specified  Hives Oral premarin  Tine Test (old Tuberculin) Not Specified  Swelling   Tomato Not Specified   Reflux  Valium (diazepam) Not Specified  Nausea Only Stop breathing  Versed (midazolam) Not Specified  Nausea And Vomiting Out of body experience  Wound Dressing Adhesive Not Specified  Hives   Iodine           Current Medications (02/12/2023):  This is the current hospital active medication list Current Facility-Administered Medications  Medication Dose Route Frequency Provider Last  Rate Last Admin   apixaban (ELIQUIS) tablet 5 mg  5 mg Oral BID Dezii, Alexandra, DO   5 mg at 02/12/23 1478   docusate sodium (COLACE) capsule 100 mg  100 mg Oral BID Dezii, Alexandra, DO   100 mg at 02/12/23 2956   metoprolol succinate (TOPROL-XL) 24 hr tablet 25 mg  25 mg Oral Daily Floydene Flock, MD   25 mg at 02/11/23 2049   ondansetron (ZOFRAN) tablet 4 mg  4 mg Oral Q6H PRN Floydene Flock, MD       Or   ondansetron [facility]) injection 4 mg  4 mg  Intravenous Q6H PRN Floydene Flock, MD       oxyCODONE-acetaminophen (PERCOCET/ROXICET) 5-325 MG per tablet 1-2 tablet  1-2 tablet Oral Q8H PRN Floydene Flock, MD   1 tablet at 02/12/23 0928   pantoprazole (PROTONIX) EC tablet 40 mg  40 mg Oral QPM Floydene Flock, MD   40 mg at 02/11/23 1711   phenol (CHLORASEPTIC) mouth spray 1 spray  1 spray Mouth/Throat PRN Floydene Flock, MD   1 spray at 02/11/23 1530   polyethylene glycol (MIRALAX / GLYCOLAX) packet 17 g  17 g Oral BID Dezii, Alexandra, DO   17 g at 02/12/23 2130     Discharge Medications: Please see discharge summary for a list of discharge medications.  Relevant Imaging Results:  Relevant Lab Results:   Additional Information SSN= [id number]  Rodney Langton, RN

## 2023-02-12 NOTE — Progress Notes (Signed)
Physical Therapy Treatment Patient Details Name: Tamara Morgan MRN: [id number] DOB: 1955-04-01 Today's Date: 02/12/2023   History of Present Illness Pt is a 67 year old female s/p revision R achilles tendon repair 02/10/23 (and FHL tendon transfer to posterior calcaneus); RLE NWBing.  Of note, pt had difficulty maintaining nonweightbearing to the right foot prior to this surgery (ended up rerupturing the right Achilles and requiring most recent surgery). Prior to most recent surgery, pt also with noted subsequent fall and secondary right humerus fracture (nondisplaced fx of greater tubercle of R humeral head; sling in place).   PMH signficant for atrial fibrillation, GERD, hyperlipidemia, hypertension, Hashimoto's, OSA.    PT Comments  Pt resting in bed upon PT arrival; pt agreeable to therapy.  5/10 R ankle pain during session (pt reports recent pain medication).  During session pt mod assist with bed mobility and mod assist to laterally scoot to R along bed with bed height elevated (pt did well maintaining NWB'ing precautions).  Will continue to focus on strengthening and progressive functional mobility during hospitalization.    If plan is discharge home, recommend the following: Two people to help with walking and/or transfers;Two people to help with bathing/dressing/bathroom;Assistance with cooking/housework;Assist for transportation;Help with stairs or ramp for entrance   Can travel by private vehicle     No  Equipment Recommendations  BSC/3in1;Wheelchair (measurements PT);Wheelchair cushion (measurements PT);Hospital bed;Hoyer lift    Recommendations for Other Services       Precautions / Restrictions Precautions Precautions: Fall Restrictions Weight Bearing Restrictions Per Provider Order: Yes RUE Weight Bearing Per Provider Order: Non weight bearing RLE Weight Bearing Per Provider Order: Non weight bearing Other Position/Activity Restrictions: R UE in sling     Mobility   Bed Mobility Overal bed mobility: Needs Assistance Bed Mobility: Supine to Sit, Sit to Supine     Supine to sit: Mod assist (assist for trunk) Sit to supine: Mod assist (assist for trunk and R LE)   General bed mobility comments: vc's for technique    Transfers Overall transfer level: Needs assistance Equipment used: None Transfers: Bed to chair/wheelchair/BSC            Lateral/Scoot Transfers: Mod assist General transfer comment: practiced lateral scooting to R along bed maintaining NWB'ing precautions; bed height elevated; vc's for head/hips relationship and overall technique with lateral scooting; x6 trials    Ambulation/Gait               General Gait Details: Not appropriate at this time   Stairs             Wheelchair Mobility     Tilt Bed    Modified Rankin (Stroke Patients Only)       Balance Overall balance assessment: Needs assistance Sitting-balance support: No upper extremity supported, Feet unsupported Sitting balance-Leahy Scale: Good Sitting balance - Comments: steady reaching within BOS with L UE     Standing balance-Leahy Scale: Zero Standing balance comment: Not appropriate to attempt at this time                            Cognition Arousal: Alert Behavior During Therapy: [facility] for tasks assessed/performed Overall Cognitive Status: Within Functional Limits for tasks assessed  Exercises General Exercises - Lower Extremity Long Arc Quad: AROM, Strengthening, Both, 10 reps, Seated Hip Flexion/Marching: AROM, Strengthening, Both, 10 reps, Seated    General Comments General comments (skin integrity, edema, etc.): R LE splint appearing intact pre/post session      Pertinent Vitals/Pain Pain Assessment Pain Assessment: 0-10 Pain Score: 5  Pain Location: RLE Pain Descriptors / Indicators: Discomfort Pain Intervention(s): Limited activity within patient's  tolerance, Monitored during session, Premedicated before session, Repositioned Vitals (HR and SpO2 on RA) stable and WNL throughout treatment session.    Home Living                          Prior Function            PT Goals (current goals can now be found in the care plan section) Acute Rehab PT Goals Patient Stated Goal: to improve functional mobility PT Goal Formulation: With patient Time For Goal Achievement: 02/25/23 Potential to Achieve Goals: Good Progress towards PT goals: Progressing toward goals    Frequency    7X/week      PT Plan      Co-evaluation              AM-PAC PT "6 Clicks" Mobility   Outcome Measure  Help needed turning from your back to your side while in a flat bed without using bedrails?: A Little Help needed moving from lying on your back to sitting on the side of a flat bed without using bedrails?: A Lot Help needed moving to and from a bed to a chair (including a wheelchair)?: Total Help needed standing up from a chair using your arms (e.g., wheelchair or bedside chair)?: Total Help needed to walk in hospital room?: Total Help needed climbing 3-5 steps with a railing? : Total 6 Click Score: 9    End of Session Equipment Utilized During Treatment: Gait belt Activity Tolerance: Patient tolerated treatment well Patient left: in bed;with call bell/phone within reach;with bed alarm set;Other (comment) (B LE's and R UE elevated via pillow; MD present end of session; pt on bed pan (pt reports she will call for nursing to take her off when finished)) Nurse Communication: Mobility status;Need for lift equipment;Precautions;Weight bearing status PT Visit Diagnosis: Other abnormalities of gait and mobility (R26.89);Muscle weakness (generalized) (M62.81);History of falling (Z91.81);Pain Pain - Right/Left: Right Pain - part of body: Ankle and joints of foot;Shoulder     Time: 1102-1130 PT Time Calculation (min) (ACUTE ONLY): 28  min  Charges:    $Therapeutic Exercise: 8-22 mins $Therapeutic Activity: 8-22 mins PT General Charges $$ ACUTE PT VISIT: 1 Visit                     Hendricks Limes, PT 02/12/23, 1:17 PM

## 2023-02-12 NOTE — Progress Notes (Signed)
PROGRESS NOTE    [name]  NWG:[id number] DOB: [birth year] DOA: 02/10/2023 PCP: Jackelyn Poling, DO  No chief complaint on file.   Hospital Course:  Tamara Morgan is 67 y.o. female with history A-fib on request, history of DVT, GERD, hyperlipidemia, Hashimoto's, presents status post repair of right Achilles tendon with podiatry.  Patient previously had retrocalcaneal Achilles tendon repair with podiatry.  She had difficulty maintaining her nonweightbearing restrictions and ended up rerupture in the right Achilles tendon.  She also had a fall with a right humerus fracture.  She presents on this admission for revision of her right Achilles tendon repair with Dr. Annamary Rummage.  Postoperatively we were asked to evaluate the patient for admission and pursue inpatient rehab.  Subjective: No acute events overnight. On evaluation today patient just finished working with physical therapy.  She feels enthusiastic at her progress.  Objective: Vitals:   02/11/23 0759 02/11/23 1612 02/11/23 2039 02/11/23 2049  BP: 139/66 (!) 109/94 (!) 130/53 (!) 130/53  Pulse: 62 73 74 74  Resp: 17 16 20    Temp: 97.8 F (36.6 C) (!) 97.4 F (36.3 C) 98.2 F (36.8 C)   TempSrc:      SpO2: 95% 94% 93%   Weight:      Height:        Intake/Output Summary (Last 24 hours) at 02/12/2023 0739 Last data filed at 02/11/2023 2000 Gross per 24 hour  Intake 240 ml  Output --  Net 240 ml   Filed Weights   02/10/23 0738  Weight: 120.2 kg    Examination: General exam: Appears calm and comfortable, NAD  Respiratory system: No work of breathing, symmetric chest wall expansion Cardiovascular system: S1 & S2 heard, RRR.  Gastrointestinal system: Abdomen is nondistended, soft and nontender.  Neuro: Alert and oriented. No focal neurological deficits. Extremities: right leg bandaged, c/d/I. Right arm in sling. Left knee tender to palpation over later tibial tuberosity. No gross deformity Skin: No rashes,  lesions Psychiatry: Demonstrates appropriate judgement and insight. Mood & affect appropriate for situation.   Assessment & Plan:  Principal Problem:   S/P Achilles tendon repair Active Problems:   Humerus fracture   Atrial fibrillation (HCC)   OSA (obstructive sleep apnea)   History of gastroesophageal reflux (GERD)   Supraventricular tachycardia (HCC)   Primary hypertension   Achilles tendon rupture  Status post Achilles tendon repair, subsequent encounter  - 12/27 Achilles tendon repair with podiatry - Continue postoperative pain control - PT 6 clicks: 9, OT, 16. TOC consult for rehab - Further recommendations per podiatry  Humerus fracture - Nondisplaced fracture of the greater tubercle of right humeral head status post fall - Sling in place - Case discussed with Dr. Clemencia Course - Outpatient follow-up with ortho - Pain control  Left Knee pain -- Pt believes she may have hit it when she fell -- No fracture on plain film  Left Ankle Pain -- Pt bearing weight without issue. No deformity appreciated -- Monitor. Pain control. PT  Atrial fibrillation - Sees outpatient cardiology via [facility] - Continue with metoprolol - Chronically on Eliquis, resumed at home dose  Primary hypertension - Blood pressure stable - Continue home dose meds  History of supraventricular tachycardia - Status post ablation - Continue home dose metoprolol  GERD - Continue PPI  OSA - CPAP at night.  BMI 41 - Outpatient follow up for lifestyle modification and risk factor management  Hyperglycemia -- HgbA1c 6.2% in Feb 2024. Repeat ordered  DVT prophylaxis: home dose Eliquis   Code Status: Full Code Family Communication: None at bedside today, discussed directly with pt.  Disposition:  Status is: Obs code 44 per UM. TOC consult for rehab placement     Consultants:  Podiatry    Procedures:  Achilles tendon repair  Antimicrobials:  Anti-infectives (From admission, onward)     Start     Dose/Rate Route Frequency Ordered Stop   02/10/23 1800  ceFAZolin (ANCEF) IVPB 2g/100 mL premix        2 g 200 mL/hr over 30 Minutes Intravenous Every 8 hours 02/10/23 1151 02/11/23 1100   02/10/23 0745  clindamycin (CLEOCIN) IVPB 900 mg  Status:  Discontinued        900 mg 100 mL/hr over 30 Minutes Intravenous  Once 02/10/23 0732 02/10/23 1153       Data Reviewed: I have personally reviewed following labs and imaging studies CBC: Recent Labs  Lab 02/10/23 1310 02/11/23 0448  WBC 7.9 9.3  NEUTROABS 6.1  --   HGB 11.8* 10.7*  HCT 36.9 32.8*  MCV 90.2 90.1  PLT 216 259   Basic Metabolic Panel: Recent Labs  Lab 02/10/23 1310 02/11/23 0448  NA 139 137  K 4.5 3.9  CL 108 105  CO2 20* 24  GLUCOSE 158* 138*  BUN 17 15  CREATININE 0.78 0.69  CALCIUM 8.8* 8.4*   GFR: Estimated Creatinine Clearance: 89.1 mL/min (by C-G formula based on SCr of 0.69 mg/dL). Liver Function Tests: Recent Labs  Lab 02/10/23 1310 02/11/23 0448  AST 42* 31  ALT 36 30  ALKPHOS 63 57  BILITOT 0.5 0.5  PROT 6.5 5.8*  ALBUMIN 3.7 3.4*   CBG: No results for input(s): "GLUCAP" in the last 168 hours.  No results found for this or any previous visit (from the past 240 hours).   Radiology Studies: DG Knee 1-2 Views Left Result Date: 02/11/2023 CLINICAL DATA:  Left knee pain after fall, pain in patellar region EXAM: LEFT KNEE - 1-2 VIEW COMPARISON:  None Available. FINDINGS: Frontal and lateral views of the left knee demonstrate no fracture, subluxation, or dislocation. Joint spaces are well preserved. No joint effusion. Soft tissues are unremarkable. IMPRESSION: 1. Unremarkable left knee. Electronically Signed   By: Sharlet Salina M.D.   On: 02/11/2023 18:41   DG Ankle 2 Views Right Result Date: 02/10/2023 CLINICAL DATA:  Right Achilles tendon repair EXAM: RIGHT ANKLE - 2 VIEW COMPARISON:  02/10/2023 12:55 a.m. FINDINGS: Frontal and lateral views of the right ankle are obtained.  Interval removal of cast material. Postsurgical changes from calcaneal osteotomy again noted. No acute fracture, subluxation, or dislocation. Joint spaces are relatively well preserved. There is diffuse soft tissue edema unchanged. IMPRESSION: 1. Diffuse soft tissue edema. 2. Postsurgical changes of the right calcaneus.  No acute fracture. Electronically Signed   By: Sharlet Salina M.D.   On: 02/10/2023 16:05   DG MINI C-ARM IMAGE ONLY Result Date: 02/10/2023 There is no interpretation for this exam.  This order is for images obtained during a surgical procedure.  Please See "Surgeries" Tab for more information regarding the procedure.    Scheduled Meds:  apixaban  5 mg Oral BID   metoprolol succinate  25 mg Oral Daily   pantoprazole  40 mg Oral QPM   Continuous Infusions:     LOS: 1 day    Time spent:   Debarah Crape, DO [facility]  To contact the attending physician between 7A-7P please  use Epic Chat. To contact the covering physician during after hours 7P-7A, please review Amion.   02/12/2023, 7:39 AM   *This document has been created with the assistance of dictation software. Please excuse typographical errors. *

## 2023-02-12 NOTE — TOC Progression Note (Signed)
Transition of Care [facility]) - Progression Note    Patient Details  Name: Tamara Morgan MRN: [id number] Date of Birth: May 11, 1955  [facility]) CM/SW Contact  Rodney Langton, RN Phone Number: 02/12/2023, 3:32 PM  Clinical Narrative:     PASRR obtained, bed search initiated for zip code [id number] per patient's request.   Expected Discharge Plan: Skilled Nursing Facility Barriers to Discharge: Continued Medical Work up  Expected Discharge Plan and Services     Post Acute Care Choice: Skilled Nursing Facility Living arrangements for the past 2 months: Single Family Home                                       Social Determinants of Health (SDOH) Interventions SDOH Screenings   Food Insecurity: No Food Insecurity (02/10/2023)  Housing: High Risk (02/10/2023)  Transportation Needs: No Transportation Needs (02/10/2023)  Utilities: Not At Risk (02/10/2023)  Depression (PHQ2-9): Low Risk  (04/14/2022)  Financial Resource Strain: Low Risk  (01/01/2023)   Received from [facility]  Physical Activity: Insufficiently Active (01/01/2023)   Received from [facility]  Social Connections: Socially Integrated (01/01/2023)   Received from [facility]  Stress: No Stress Concern Present (01/01/2023)   Received from [facility]  Tobacco Use: Low Risk  (02/10/2023)    Readmission Risk Interventions     No data to display

## 2023-02-12 NOTE — Plan of Care (Signed)

## 2023-02-13 ENCOUNTER — Encounter: Payer: Self-pay | Admitting: Podiatry

## 2023-02-13 DIAGNOSIS — S86011A Strain of right Achilles tendon, initial encounter: Secondary | ICD-10-CM | POA: Diagnosis not present

## 2023-02-13 DIAGNOSIS — Z9889 Other specified postprocedural states: Secondary | ICD-10-CM | POA: Diagnosis not present

## 2023-02-13 LAB — HEMOGLOBIN A1C
Hgb A1c MFr Bld: 6.1 % — ABNORMAL HIGH (ref 4.8–5.6)
Mean Plasma Glucose: 128 mg/dL

## 2023-02-13 MED ORDER — LOSARTAN POTASSIUM 25 MG PO TABS
25.0000 mg | ORAL_TABLET | Freq: Every day | ORAL | Status: DC
  Administered 2023-02-13 – 2023-02-14 (×2): 25 mg via ORAL
  Filled 2023-02-13 (×2): qty 1

## 2023-02-13 MED ORDER — DIPHENHYDRAMINE HCL 25 MG PO CAPS
25.0000 mg | ORAL_CAPSULE | Freq: Once | ORAL | Status: AC
  Administered 2023-02-13: 25 mg via ORAL
  Filled 2023-02-13: qty 1

## 2023-02-13 NOTE — Progress Notes (Signed)
PROGRESS NOTE    Tamara Morgan  WJX:[id number] DOB: [birth year] DOA: 02/10/2023 PCP: Jackelyn Poling, DO  No chief complaint on file.   Hospital Course:  Tamara Morgan is 67 y.o. female with history A-fib on request, history of DVT, GERD, hyperlipidemia, Hashimoto's, presents status post repair of right Achilles tendon with podiatry.  Patient previously had retrocalcaneal Achilles tendon repair with podiatry.  She had difficulty maintaining her nonweightbearing restrictions and ended up rerupture in the right Achilles tendon.  She also had a fall with a right humerus fracture.  She presents on this admission for revision of her right Achilles tendon repair with Dr. Annamary Rummage.  Postoperatively we were asked to evaluate the patient for admission and pursue inpatient rehab.  Subjective: Some pain today but well controlled with current meds. Difficulty getting up and going to the bathroom without assistance, issues overnight. Amendable to purewick  Objective: Vitals:   02/12/23 0748 02/12/23 1444 02/12/23 2112 02/13/23 0730  BP: (!) 144/64 (!) 132/56 (!) 146/61 (!) 164/85  Pulse: (!) 54 69 69 60  Resp:   20 16  Temp: 97.8 F (36.6 C) 98.1 F (36.7 C) 98.8 F (37.1 C) (!) 97.5 F (36.4 C)  TempSrc:      SpO2: 91% 93% 95% 96%  Weight:      Height:        Intake/Output Summary (Last 24 hours) at 02/13/2023 1643 Last data filed at 02/12/2023 2000 Gross per 24 hour  Intake 240 ml  Output --  Net 240 ml   Filed Weights   02/10/23 0738  Weight: 120.2 kg    Examination: General exam: Appears calm and comfortable, NAD  Respiratory system: No work of breathing, symmetric chest wall expansion Cardiovascular system: S1 & S2 heard, RRR.  Gastrointestinal system: Abdomen is nondistended, soft and nontender.  Neuro: Alert and oriented. No focal neurological deficits. Extremities: right leg bandaged, c/d/I. Right arm in sling. Left knee tender to palpation over later tibial  tuberosity. No gross deformity Skin: No rashes, lesions Psychiatry: Demonstrates appropriate judgement and insight. Mood & affect appropriate for situation.   Assessment & Plan:  Principal Problem:   S/P Achilles tendon repair Active Problems:   Humerus fracture   Atrial fibrillation (HCC)   OSA (obstructive sleep apnea)   History of gastroesophageal reflux (GERD)   Supraventricular tachycardia (HCC)   Primary hypertension   Achilles tendon rupture   Achilles tendon rupture, right, subsequent encounter  Status post Achilles tendon repair, subsequent encounter  - 12/27 Achilles tendon repair with podiatry - Continue postoperative pain control - PT 6 clicks: 9, OT, 16. TOC consult for rehab - Further recommendations per podiatry  Humerus fracture - Nondisplaced fracture of the greater tubercle of right humeral head status post fall - Sling in place - Case discussed with Dr. Clemencia Course - Outpatient follow-up with ortho - Pain control  Left Knee pain -- Pt believes she may have hit it when she fell -- No fracture on plain film  Left Ankle Pain -- Pt bearing weight without issue. No deformity appreciated -- Monitor. Pain control. PT  Atrial fibrillation - Sees outpatient cardiology via [facility] - Continue with metoprolol - Chronically on Eliquis, resumed at home dose  Primary hypertension - Blood pressure stable - Continue home dose meds  History of supraventricular tachycardia - Status post ablation - Continue home dose metoprolol  GERD - Continue PPI  OSA - CPAP at night.  BMI 41 - Outpatient follow up for lifestyle modification and risk  factor management  Hyperglycemia -- HgbA1c 6.2% in Feb 2024. Repeat ordered     DVT prophylaxis: home dose Eliquis   Code Status: Full Code Family Communication: None at bedside today, discussed directly with pt.  Disposition:  Status is: Obs code 44 per [facility]. TOC consult for rehab placement     Consultants:  Podiatry     Procedures:  Achilles tendon repair  Antimicrobials:  Anti-infectives (From admission, onward)    Start     Dose/Rate Route Frequency Ordered Stop   02/10/23 1800  ceFAZolin (ANCEF) IVPB 2g/100 mL premix        2 g 200 mL/hr over 30 Minutes Intravenous Every 8 hours 02/10/23 1151 02/11/23 1100   02/10/23 0745  clindamycin (CLEOCIN) IVPB 900 mg  Status:  Discontinued        900 mg 100 mL/hr over 30 Minutes Intravenous  Once 02/10/23 0732 02/10/23 1153       Data Reviewed: I have personally reviewed following labs and imaging studies CBC: Recent Labs  Lab 02/10/23 1310 02/11/23 0448  WBC 7.9 9.3  NEUTROABS 6.1  --   HGB 11.8* 10.7*  HCT 36.9 32.8*  MCV 90.2 90.1  PLT 216 259   Basic Metabolic Panel: Recent Labs  Lab 02/10/23 1310 02/11/23 0448  NA 139 137  K 4.5 3.9  CL 108 105  CO2 20* 24  GLUCOSE 158* 138*  BUN 17 15  CREATININE 0.78 0.69  CALCIUM 8.8* 8.4*   GFR: Estimated Creatinine Clearance: 89.1 mL/min (by C-G formula based on SCr of 0.69 mg/dL). Liver Function Tests: Recent Labs  Lab 02/10/23 1310 02/11/23 0448  AST 42* 31  ALT 36 30  ALKPHOS 63 57  BILITOT 0.5 0.5  PROT 6.5 5.8*  ALBUMIN 3.7 3.4*   CBG: No results for input(s): "GLUCAP" in the last 168 hours.  No results found for this or any previous visit (from the past 240 hours).   Radiology Studies: No results found.   Scheduled Meds:  apixaban  5 mg Oral BID   docusate sodium  100 mg Oral BID   losartan  25 mg Oral Daily   metoprolol succinate  25 mg Oral Daily   pantoprazole  40 mg Oral QPM   polyethylene glycol  17 g Oral BID   Continuous Infusions:     LOS: 0 days    Time spent:   Debarah Crape, DO [facility]  To contact the attending physician between 7A-7P please use Epic Chat. To contact the covering physician during after hours 7P-7A, please review Amion.   02/13/2023, 4:43 PM   *This document has been created with the assistance of  dictation software. Please excuse typographical errors. *

## 2023-02-13 NOTE — Progress Notes (Signed)
Physical Therapy Treatment Patient Details Name: Tamara Morgan MRN: [id number] DOB: 06-23-1955 Today's Date: 02/13/2023   History of Present Illness Pt is a 67 year old female s/p revision R achilles tendon repair 02/10/23 (and FHL tendon transfer to posterior calcaneus); RLE NWBing.  Of note, pt had difficulty maintaining nonweightbearing to the right foot prior to this surgery (ended up rerupturing the right Achilles and requiring most recent surgery). Prior to most recent surgery, pt also with noted subsequent fall and secondary right humerus fracture (nondisplaced fx of greater tubercle of R humeral head; sling in place).   PMH signficant for atrial fibrillation, GERD, hyperlipidemia, hypertension, Hashimoto's, OSA.    PT Comments  Pt resting in bed upon PT arrival; pt agreeable to therapy.  Mild R LE pain reported during session; pt did well maintaining NWB'ing precautions during session.  Currently pt is SBA semi-supine to sitting EOB; CGA with lateral scooting to R along bed; max assist attempting to laterally scoot to L along bed; and mod assist to lay down in bed.  Pt requiring vc's and visual demo for transfer technique during session.  Will continue to focus on strengthening and progressive functional mobility during hospitalization.   If plan is discharge home, recommend the following: Two people to help with walking and/or transfers;Two people to help with bathing/dressing/bathroom;Assistance with cooking/housework;Assist for transportation;Help with stairs or ramp for entrance   Can travel by private vehicle     No  Equipment Recommendations  BSC/3in1;Wheelchair (measurements PT);Wheelchair cushion (measurements PT);Hospital bed;Hoyer lift    Recommendations for Other Services       Precautions / Restrictions Precautions Precautions: Fall Restrictions Weight Bearing Restrictions Per Provider Order: Yes RUE Weight Bearing Per Provider Order: Non weight bearing RLE Weight  Bearing Per Provider Order: Non weight bearing Other Position/Activity Restrictions: R UE in sling     Mobility  Bed Mobility Overal bed mobility: Needs Assistance Bed Mobility: Supine to Sit, Sit to Supine     Supine to sit: Supervision, HOB elevated, Used rails Sit to supine: Mod assist (assist for trunk and R LE)   General bed mobility comments: x2 trials each (pt needing to use bed pan during session)    Transfers Overall transfer level: Needs assistance Equipment used: None Transfers: Bed to chair/wheelchair/BSC            Lateral/Scoot Transfers: Max assist, Contact guard assist General transfer comment: practiced lateral scooting to R/L along bed maintaining NWB'ing precautions; bed height elevated; vc's for head/hips relationship and overall technique with lateral scooting; x6 trials to R; x4 trials to L; x4 trials to R; minimal movement to L with max assist; small movements to R each trial with CGA    Ambulation/Gait               General Gait Details: Not appropriate at this time   Stairs             Wheelchair Mobility     Tilt Bed    Modified Rankin (Stroke Patients Only)       Balance Overall balance assessment: Needs assistance Sitting-balance support: No upper extremity supported, Feet unsupported Sitting balance-Leahy Scale: Good Sitting balance - Comments: steady reaching within BOS with L UE       Standing balance comment: Not appropriate to attempt at this time                            Cognition Arousal: Alert  Behavior During Therapy: WFL for tasks assessed/performed Overall Cognitive Status: Within Functional Limits for tasks assessed                                          Exercises      General Comments General comments (skin integrity, edema, etc.): R LE splint appearing intact pre-post session.        Pertinent Vitals/Pain Pain Assessment Pain Assessment: 0-10 Pain Score: 3   Pain Location: RLE Pain Descriptors / Indicators: Discomfort Pain Intervention(s): Limited activity within patient's tolerance, Monitored during session, Premedicated before session, Repositioned Vitals (HR and SpO2 on room air) stable and WNL throughout treatment session.    Home Living                          Prior Function            PT Goals (current goals can now be found in the care plan section) Acute Rehab PT Goals Patient Stated Goal: to improve functional mobility PT Goal Formulation: With patient Time For Goal Achievement: 02/25/23 Potential to Achieve Goals: Good Progress towards PT goals: Progressing toward goals    Frequency    7X/week      PT Plan      Co-evaluation              AM-PAC PT "6 Clicks" Mobility   Outcome Measure  Help needed turning from your back to your side while in a flat bed without using bedrails?: A Little Help needed moving from lying on your back to sitting on the side of a flat bed without using bedrails?: A Little Help needed moving to and from a bed to a chair (including a wheelchair)?: Total Help needed standing up from a chair using your arms (e.g., wheelchair or bedside chair)?: Total Help needed to walk in hospital room?: Total Help needed climbing 3-5 steps with a railing? : Total 6 Click Score: 10    End of Session Equipment Utilized During Treatment: Gait belt;Other (comment) (R UE sling) Activity Tolerance: Patient tolerated treatment well Patient left: in bed;with call bell/phone within reach;with bed alarm set;Other (comment);with nursing/sitter in room (B LE's and R UE elevated via pillow) Nurse Communication: Mobility status;Need for lift equipment;Precautions;Weight bearing status PT Visit Diagnosis: Other abnormalities of gait and mobility (R26.89);Muscle weakness (generalized) (M62.81);History of falling (Z91.81);Pain Pain - Right/Left: Right Pain - part of body: Ankle and joints of  foot;Shoulder     Time: 1610-9604 PT Time Calculation (min) (ACUTE ONLY): 31 min  Charges:    $Therapeutic Activity: 23-37 mins PT General Charges $$ ACUTE PT VISIT: 1 Visit                     Hendricks Limes, PT 02/13/23, 2:38 PM

## 2023-02-13 NOTE — Progress Notes (Signed)
Nurse Aide said that pt has itchiness under her right arm. RN went to assess the pt. Pt requests benadryl for itchiness. RN notified Provider.

## 2023-02-13 NOTE — Plan of Care (Signed)

## 2023-02-13 NOTE — TOC Progression Note (Signed)
Transition of Care [facility]) - Progression Note    Patient Details  Name: Tamara Morgan MRN: [id number] Date of Birth: 25-Sep-1955  Transition of Care [facility]) CM/SW Contact  Marlowe Sax, RN Phone Number: 02/13/2023, 4:03 PM  Clinical Narrative:     Met with the patient to discuss and review STR bed offers, also reminded and reviewed the Code 44 and the fact that she is observation status, she stated that she understands and someone already told her, we reviewed each of the bed offers and she chose [facility], she understands that it will depend on Ins approval, she stated understanding   Expected Discharge Plan: Skilled Nursing Facility Barriers to Discharge: Continued Medical Work up  Expected Discharge Plan and Services     Post Acute Care Choice: Skilled Nursing Facility Living arrangements for the past 2 months: Single Family Home                                       Social Determinants of Health (SDOH) Interventions SDOH Screenings   Food Insecurity: No Food Insecurity (02/10/2023)  Housing: High Risk (02/10/2023)  Transportation Needs: No Transportation Needs (02/10/2023)  Utilities: Not At Risk (02/10/2023)  Depression (PHQ2-9): Low Risk  (04/14/2022)  Financial Resource Strain: Low Risk  (01/01/2023)   Received from [facility]  Physical Activity: Insufficiently Active (01/01/2023)   Received from [facility]  Social Connections: Socially Integrated (01/01/2023)   Received from [facility]  Stress: No Stress Concern Present (01/01/2023)   Received from [facility]  Tobacco Use: Low Risk  (02/10/2023)    Readmission Risk Interventions     No data to display

## 2023-02-13 NOTE — Care Management Obs Status (Signed)
MEDICARE OBSERVATION STATUS NOTIFICATION   Patient Details  Name: Kaleb Dimeglio MRN: [id number] Date of Birth: 07/21/1955   Medicare Observation Status Notification Given:  Yes    Marlowe Sax, RN 02/13/2023, 3:24 PM

## 2023-02-13 NOTE — Progress Notes (Signed)
Pt requested Respiratory Therapist to come and set up her CPAP. RN called RT, who said that she will come and set it up for the pt.

## 2023-02-14 DIAGNOSIS — Z9889 Other specified postprocedural states: Secondary | ICD-10-CM | POA: Diagnosis not present

## 2023-02-14 DIAGNOSIS — Z86718 Personal history of other venous thrombosis and embolism: Secondary | ICD-10-CM | POA: Diagnosis not present

## 2023-02-14 DIAGNOSIS — M898X2 Other specified disorders of bone, upper arm: Secondary | ICD-10-CM | POA: Diagnosis not present

## 2023-02-14 DIAGNOSIS — I10 Essential (primary) hypertension: Secondary | ICD-10-CM | POA: Diagnosis not present

## 2023-02-14 DIAGNOSIS — S86011D Strain of right Achilles tendon, subsequent encounter: Secondary | ICD-10-CM | POA: Diagnosis not present

## 2023-02-14 DIAGNOSIS — I4891 Unspecified atrial fibrillation: Secondary | ICD-10-CM | POA: Diagnosis not present

## 2023-02-14 DIAGNOSIS — I341 Nonrheumatic mitral (valve) prolapse: Secondary | ICD-10-CM | POA: Diagnosis not present

## 2023-02-14 DIAGNOSIS — Z8719 Personal history of other diseases of the digestive system: Secondary | ICD-10-CM | POA: Diagnosis not present

## 2023-02-14 DIAGNOSIS — R609 Edema, unspecified: Secondary | ICD-10-CM | POA: Diagnosis not present

## 2023-02-14 DIAGNOSIS — R0789 Other chest pain: Secondary | ICD-10-CM | POA: Diagnosis not present

## 2023-02-14 DIAGNOSIS — K760 Fatty (change of) liver, not elsewhere classified: Secondary | ICD-10-CM | POA: Diagnosis not present

## 2023-02-14 DIAGNOSIS — Z7901 Long term (current) use of anticoagulants: Secondary | ICD-10-CM | POA: Diagnosis not present

## 2023-02-14 DIAGNOSIS — S86019D Strain of unspecified Achilles tendon, subsequent encounter: Secondary | ICD-10-CM | POA: Diagnosis not present

## 2023-02-14 DIAGNOSIS — I700 Atherosclerosis of aorta: Secondary | ICD-10-CM | POA: Diagnosis not present

## 2023-02-14 DIAGNOSIS — R1311 Dysphagia, oral phase: Secondary | ICD-10-CM | POA: Diagnosis not present

## 2023-02-14 DIAGNOSIS — S42309D Unspecified fracture of shaft of humerus, unspecified arm, subsequent encounter for fracture with routine healing: Secondary | ICD-10-CM | POA: Diagnosis not present

## 2023-02-14 DIAGNOSIS — R079 Chest pain, unspecified: Secondary | ICD-10-CM | POA: Diagnosis not present

## 2023-02-14 DIAGNOSIS — G4733 Obstructive sleep apnea (adult) (pediatric): Secondary | ICD-10-CM | POA: Diagnosis not present

## 2023-02-14 DIAGNOSIS — E785 Hyperlipidemia, unspecified: Secondary | ICD-10-CM | POA: Diagnosis not present

## 2023-02-14 DIAGNOSIS — B974 Respiratory syncytial virus as the cause of diseases classified elsewhere: Secondary | ICD-10-CM | POA: Diagnosis not present

## 2023-02-14 DIAGNOSIS — M66879 Spontaneous rupture of other tendons, unspecified ankle and foot: Secondary | ICD-10-CM | POA: Diagnosis not present

## 2023-02-14 DIAGNOSIS — I471 Supraventricular tachycardia, unspecified: Secondary | ICD-10-CM | POA: Diagnosis not present

## 2023-02-14 DIAGNOSIS — R278 Other lack of coordination: Secondary | ICD-10-CM | POA: Diagnosis not present

## 2023-02-14 DIAGNOSIS — M66871 Spontaneous rupture of other tendons, right ankle and foot: Secondary | ICD-10-CM | POA: Diagnosis not present

## 2023-02-14 DIAGNOSIS — R509 Fever, unspecified: Secondary | ICD-10-CM | POA: Diagnosis not present

## 2023-02-14 DIAGNOSIS — Z7401 Bed confinement status: Secondary | ICD-10-CM | POA: Diagnosis not present

## 2023-02-14 DIAGNOSIS — J309 Allergic rhinitis, unspecified: Secondary | ICD-10-CM | POA: Diagnosis not present

## 2023-02-14 DIAGNOSIS — K449 Diaphragmatic hernia without obstruction or gangrene: Secondary | ICD-10-CM | POA: Diagnosis not present

## 2023-02-14 DIAGNOSIS — R0602 Shortness of breath: Secondary | ICD-10-CM | POA: Diagnosis not present

## 2023-02-14 DIAGNOSIS — S86011A Strain of right Achilles tendon, initial encounter: Secondary | ICD-10-CM | POA: Diagnosis not present

## 2023-02-14 DIAGNOSIS — E063 Autoimmune thyroiditis: Secondary | ICD-10-CM | POA: Diagnosis not present

## 2023-02-14 DIAGNOSIS — R531 Weakness: Secondary | ICD-10-CM | POA: Diagnosis not present

## 2023-02-14 DIAGNOSIS — R262 Difficulty in walking, not elsewhere classified: Secondary | ICD-10-CM | POA: Diagnosis not present

## 2023-02-14 DIAGNOSIS — M25551 Pain in right hip: Secondary | ICD-10-CM | POA: Diagnosis not present

## 2023-02-14 MED ORDER — OXYCODONE-ACETAMINOPHEN 5-325 MG PO TABS: 1.0000 | ORAL_TABLET | Freq: Four times a day (QID) | ORAL | 0 refills | Status: AC | PRN

## 2023-02-14 MED ORDER — DIPHENHYDRAMINE HCL 25 MG PO CAPS
25.0000 mg | ORAL_CAPSULE | Freq: Four times a day (QID) | ORAL | Status: DC | PRN
  Administered 2023-02-14 (×2): 25 mg via ORAL
  Filled 2023-02-14 (×2): qty 1

## 2023-02-14 MED ORDER — HYDROCORTISONE 0.5 % EX CREA: TOPICAL_CREAM | CUTANEOUS | Status: AC

## 2023-02-14 MED ORDER — CLINDAMYCIN HCL 300 MG PO CAPS: 300.0000 mg | ORAL_CAPSULE | Freq: Three times a day (TID) | ORAL | 0 refills | Status: AC

## 2023-02-14 MED ORDER — LOSARTAN POTASSIUM 25 MG PO TABS: 25.0000 mg | ORAL_TABLET | Freq: Every day | ORAL | Status: AC

## 2023-02-14 MED ORDER — DOCUSATE SODIUM 100 MG PO CAPS: 100.0000 mg | ORAL_CAPSULE | Freq: Two times a day (BID) | ORAL | Status: DC | PRN

## 2023-02-14 MED ORDER — APIXABAN 5 MG PO TABS: 5.0000 mg | ORAL_TABLET | Freq: Two times a day (BID) | ORAL | Status: AC

## 2023-02-14 MED ORDER — HYDROCORTISONE 0.5 % EX CREA
TOPICAL_CREAM | Freq: Two times a day (BID) | CUTANEOUS | Status: DC
  Filled 2023-02-14: qty 28.35

## 2023-02-14 NOTE — Progress Notes (Signed)
 Physical Therapy Treatment Patient Details Name: Tamara Morgan MRN: [id number] DOB: 07-Sep-1955 Today's Date: 02/14/2023   History of Present Illness Pt is a 67 year old female s/p revision R achilles tendon repair 02/10/23 (and FHL tendon transfer to posterior calcaneus); RLE NWBing.  Of note, pt had difficulty maintaining nonweightbearing to the right foot prior to this surgery (ended up rerupturing the right Achilles and requiring most recent surgery). Prior to most recent surgery, pt also with noted subsequent fall and secondary right humerus fracture (nondisplaced fx of greater tubercle of R humeral head; sling in place).   PMH signficant for atrial fibrillation, GERD, hyperlipidemia, hypertension, Hashimoto's, OSA.    PT Comments  Pt resting in bed upon PT arrival; agreeable to therapy.  2/10 R LE pain beginning/end of session at rest.  Performed sitting slide-board transfers bed to recliner (x2 trials with mod assist) and recliner to bed (x1 trial with max assist) with vc's for technique.  Pt did well maintaining NWB'ing precautions during session.  Will continue to focus on strengthening, balance, and progressive functional mobility during hospitalization.    If plan is discharge home, recommend the following: Two people to help with walking and/or transfers;Two people to help with bathing/dressing/bathroom;Assistance with cooking/housework;Assist for transportation;Help with stairs or ramp for entrance   Can travel by private vehicle     No  Equipment Recommendations  BSC/3in1;Wheelchair (measurements PT);Wheelchair cushion (measurements PT);Hospital bed;Hoyer lift;Other (comment) (slideboard)    Recommendations for Other Services       Precautions / Restrictions Precautions Precautions: Fall Restrictions Weight Bearing Restrictions Per Provider Order: Yes RUE Weight Bearing Per Provider Order: Non weight bearing RLE Weight Bearing Per Provider Order: Non weight bearing Other  Position/Activity Restrictions: R UE in sling     Mobility  Bed Mobility Overal bed mobility: Needs Assistance Bed Mobility: Supine to Sit     Supine to sit: Supervision, HOB elevated, Used rails     General bed mobility comments: increased effort to perform on own    Transfers Overall transfer level: Needs assistance Equipment used: Sliding board Transfers: Bed to chair/wheelchair/BSC            Lateral/Scoot Transfers: Mod assist, Max assist General transfer comment: sitting slideboard transfer bed to recliner x2 trials (mod assist) and recliner to bed x1 trial (max assist); vc's for technique    Ambulation/Gait               General Gait Details: Not appropriate at this time   Stairs             Wheelchair Mobility     Tilt Bed    Modified Rankin (Stroke Patients Only)       Balance Overall balance assessment: Needs assistance Sitting-balance support: No upper extremity supported, Feet unsupported Sitting balance-Leahy Scale: Good Sitting balance - Comments: steady reaching within BOS with L UE       Standing balance comment: Not appropriate to attempt at this time                            Cognition Arousal: Alert Behavior During Therapy: [facility] for tasks assessed/performed Overall Cognitive Status: Within Functional Limits for tasks assessed                                          Exercises  General Comments General comments (skin integrity, edema, etc.): R LE splint appearing intact pre-post session.  Pt agreeable to PT session.      Pertinent Vitals/Pain Pain Assessment Pain Assessment: 0-10 Pain Score: 2  Pain Location: RLE Pain Descriptors / Indicators: Discomfort Pain Intervention(s): Limited activity within patient's tolerance, Monitored during session, Premedicated before session, Repositioned Vitals (HR and SpO2 on room air) stable and WFL throughout treatment session.    Home  Living                          Prior Function            PT Goals (current goals can now be found in the care plan section) Acute Rehab PT Goals Patient Stated Goal: to improve functional mobility PT Goal Formulation: With patient Time For Goal Achievement: 02/25/23 Potential to Achieve Goals: Good Progress towards PT goals: Progressing toward goals    Frequency    7X/week      PT Plan      Co-evaluation              AM-PAC PT 6 Clicks Mobility   Outcome Measure  Help needed turning from your back to your side while in a flat bed without using bedrails?: A Little Help needed moving from lying on your back to sitting on the side of a flat bed without using bedrails?: A Little Help needed moving to and from a bed to a chair (including a wheelchair)?: A Lot Help needed standing up from a chair using your arms (e.g., wheelchair or bedside chair)?: Total Help needed to walk in hospital room?: Total Help needed climbing 3-5 steps with a railing? : Total 6 Click Score: 11    End of Session Equipment Utilized During Treatment: Gait belt;Other (comment) (R UE sling) Activity Tolerance: Patient tolerated treatment well Patient left: in chair;with call bell/phone within reach;with chair alarm set;Other (comment) (B LE's and R UE elevated via pillow support) Nurse Communication: Mobility status;Need for lift equipment;Precautions;Weight bearing status (NT and nurse notified of recommendation for hoyer lift back to bed) PT Visit Diagnosis: Other abnormalities of gait and mobility (R26.89);Muscle weakness (generalized) (M62.81);History of falling (Z91.81);Pain Pain - Right/Left: Right Pain - part of body: Ankle and joints of foot;Shoulder     Time: 9141-9074 PT Time Calculation (min) (ACUTE ONLY): 27 min  Charges:    $Therapeutic Activity: 23-37 mins PT General Charges $$ ACUTE PT VISIT: 1 Visit                    Damien Caulk, PT 02/14/23, 1:15  PM

## 2023-02-14 NOTE — TOC Progression Note (Signed)
 Transition of Care [facility]) - Progression Note    Patient Details  Name: Tamara Morgan MRN: [id number] Date of Birth: [birth year]  Transition of Care [facility]) CM/SW Contact  Royanne JINNY Bernheim, RN Phone Number: 02/14/2023, 12:01 PM  Clinical Narrative:     approved 12/30 - 1/1 NRD 1/1 [name] PI#[id number]   Expected Discharge Plan: Skilled Nursing Facility Barriers to Discharge: Continued Medical Work up  Expected Discharge Plan and Services     Post Acute Care Choice: Skilled Nursing Facility Living arrangements for the past 2 months: Single Family Home                                       Social Determinants of Health (SDOH) Interventions SDOH Screenings   Food Insecurity: No Food Insecurity (02/10/2023)  Housing: High Risk (02/10/2023)  Transportation Needs: No Transportation Needs (02/10/2023)  Utilities: Not At Risk (02/10/2023)  Depression (PHQ2-9): Low Risk  (04/14/2022)  Financial Resource Strain: Low Risk  (01/01/2023)   Received from [facility]  Physical Activity: Insufficiently Active (01/01/2023)   Received from [facility]  Social Connections: Socially Integrated (01/01/2023)   Received from [facility]  Stress: No Stress Concern Present (01/01/2023)   Received from [facility]  Tobacco Use: Low Risk  (02/10/2023)    Readmission Risk Interventions     No data to display

## 2023-02-14 NOTE — Discharge Summary (Signed)
 Physician Discharge Summary  Tamara Morgan FMW:[id number] DOB: 01-Jul-1955 DOA: 02/10/2023  PCP: Dayna Motto, DO  Admit date: 02/10/2023 Discharge date: 02/14/2023  Admitted From: home  Disposition:  SNF  Recommendations for Outpatient Follow-up:  Follow up with PCP in 1-2 weeks F/u w/ podiatry, Dr. Malvin, on February 16, 2023   Home Health: no  Equipment/Devices:  Discharge Condition: stable  CODE STATUS: full  Diet recommendation: Heart Healthy   Brief/Interim Summary: HPI was taken from Dr. Eldonna: Tamara Morgan is a 67 y.o. female with medical history significant of atrial fibrillation, GERD, hyperlipidemia, hypertension, Hashimoto's presenting status post repair of right Achilles tendon.  Noted to have recently had right retrocalcaneal operative evaluation as well as Achilles tendon repair with podiatry.  She had difficulty maintaining nonweightbearing to the right foot and ended up rerupture in the right Achilles.  Patient also with noted subsequent fall and secondary right humerus fracture.  Patient had revision of right Achilles tendon repair today with Dr. Malvin.  Dr. Malvin reached out for admission for patient to receive evaluation for rehab.  At present, patient denies any chest pain, shortness of breath nausea or vomiting.  Does have a fair amount of pain in the right shoulder as well as right lower extremity.  No fevers or chills.  Noted atrial fibrillation and remote history of DVT on Eliquis .  Currently on hold perioperatively. Currently in PACU afebrile, hemodynamically stable.  On 2 L with O2 sats of 100%.  Right shoulder plain films with nondisplaced fracture of the greater tubercle of the right humeral head.  Sling in place.  Soft tissue swelling of left index finger.  Labs including CBC and CMP are pending.  Discharge Diagnoses:  Principal Problem:   S/P Achilles tendon repair Active Problems:   Humerus fracture   Atrial fibrillation (HCC)   OSA  (obstructive sleep apnea)   History of gastroesophageal reflux (GERD)   Supraventricular tachycardia (HCC)   Primary hypertension   Achilles tendon rupture   Achilles tendon rupture, right, subsequent encounter  As per Dr. [name]  Status post Achilles tendon repair, subsequent encounter  - 12/27 Achilles tendon repair with podiatry    Humerus fracture - Nondisplaced fracture of the greater tubercle of right humeral head status post fall - Sling in place - Case discussed with Dr. [name] - Outpatient follow-up with ortho  Left Knee pain -- Pt believes she may have hit it when she fell -- No fracture on plain film   Left Ankle Pain -- Pt bearing weight without issue. No deformity appreciated   Atrial fib: likely PAF - Sees outpatient cardiology via [facility] - Continue with metoprolol  - Chronically on Eliquis    Primary hypertension Continue on metoprolol , losartan     History of supraventricular tachycardia - Status post ablation - Continue home dose metoprolol    GERD - Continue PPI   OSA - CPAP at night.   BMI 41 - Outpatient follow up for lifestyle modification and risk factor management   Hyperglycemia -- HgbA1c 6.2% in Feb 2024.    Discharge Instructions  Discharge Instructions     Diet - low sodium heart healthy   Complete by: As directed    Discharge instructions   Complete by: As directed    F/u w/ PCP in 1-2 weeks. Nonweightbearing in posterior splint to the right lower extremity as per podiatry. F/u w/ podiatry, Dr. Malvin, on Thursday, February 16, 2023 for postop wound check and splint change.   Increase activity slowly   Complete  by: As directed       Allergies as of 02/14/2023       Reactions   Tramadol Other (See Comments)   Hyperanxiety  Panic attacks   Aspirin    Edema    Betadine [povidone Iodine] Hives   Cephalosporins Diarrhea, Itching   TOLERATED CEFAZOLIN  02/10/23 (Original allergy to cephalexin  - itching)   Codeine     Central nervous system    Cortisone    Unknown   Doxycycline     Other Reaction(s): GI Intolerance, severe diarrhea   Gluten Meal    unknown   Mushroom Extract Complex (do Not Select)    Fungi infection    Naproxen Swelling   Onion    Reflux   Prednisone    oral   Premarin [conjugated Estrogens] Hives   Oral premarin    Tine Test [old Tuberculin] Swelling   Tomato    Reflux   Valium [diazepam] Nausea Only   Stop breathing    Versed  [midazolam ] Nausea And Vomiting   Out of body experience    Wound Dressing Adhesive Hives   Iodine Rash        Medication List     STOP taking these medications    ibuprofen  800 MG tablet Commonly known as: ADVIL        TAKE these medications    ALLERGY RELIEF PLUS SINUS PO Take 25 mg by mouth at bedtime as needed (Congestion).   apixaban  5 MG Tabs tablet Commonly known as: ELIQUIS  Take 1 tablet (5 mg total) by mouth 2 (two) times daily. What changed:  medication strength how much to take   Azelastine HCl 137 MCG/SPRAY Soln Place 1-2 sprays into the nose 2 (two) times daily as needed (allergies).   BERBERINE HCI PO Take 1,600 mg by mouth in the morning. 800 mg each   chlorhexidine  0.12 % solution Commonly known as: PERIDEX  Use as directed 15 mLs in the mouth or throat daily as needed (tongue).   clindamycin  300 MG capsule Commonly known as: CLEOCIN  Take 1 capsule (300 mg total) by mouth 3 (three) times daily for 5 days. What changed: when to take this   COLD AND FLU PO Take 2 tablets by mouth 2 (two) times daily as needed (Cold).   Flovent HFA 110 MCG/ACT inhaler Generic drug: fluticasone Inhale 2 puffs into the lungs as needed.   fluticasone 50 MCG/ACT nasal spray Commonly known as: FLONASE Place 2 sprays into both nostrils daily.   guaiFENesin -dextromethorphan 100-10 MG/5ML syrup Commonly known as: ROBITUSSIN DM Take 5 mLs by mouth 3 (three) times daily as needed for cough.   hydrocortisone  cream 0.5  % Apply BID underarm rash   iVIZIA Dry Eyes 0.5 % Soln Generic drug: Povidone (PF) Place 2 drops into both eyes at bedtime as needed (Dry eyes).   K2-D3 MAX PO Take 1 capsule by mouth daily.   losartan  25 MG tablet Commonly known as: COZAAR  Take 1 tablet (25 mg total) by mouth daily. Start taking on: February 15, 2023 What changed:  when to take this additional instructions   Melatonin 10 MG Tabs Take 20 mg by mouth at bedtime.   metoprolol  succinate 25 MG 24 hr tablet Commonly known as: TOPROL -XL Take 1 tablet (25 mg total) by mouth daily.   OVER THE COUNTER MEDICATION Take 1 tablet by mouth 2 (two) times daily. Joint 30 + advance supplement   OVER THE COUNTER MEDICATION Take 2 capsules by mouth in the morning. Thyroid  hormone formulia  OVER THE COUNTER MEDICATION Take 1 capsule by mouth 2 (two) times daily. Immune Defence   OVER THE COUNTER MEDICATION Take 1 capsule by mouth in the morning. C CL balance Formula   OVER THE COUNTER MEDICATION Take 2 capsules by mouth daily with lunch. Lymph System   OVER THE COUNTER MEDICATION Take 3 capsules by mouth at bedtime. Calm Support   oxyCODONE -acetaminophen  5-325 MG tablet Commonly known as: PERCOCET/ROXICET Take 1 tablet by mouth every 6 (six) hours as needed for up to 2 days for severe pain (pain score 7-10) or moderate pain (pain score 4-6). What changed:  when to take this reasons to take this   pantoprazole  40 MG tablet Commonly known as: PROTONIX  Take 1 tablet (40 mg total) by mouth every evening.   PROBIOTIC-PREBIOTIC PO Take 2 capsules by mouth daily. Post biotic 3 complete   REFRESH TEARS PF OP Place 1 drop into both eyes daily as needed (Dry eye).   Turmeric Curcumin Caps Take 1 capsule by mouth 2 (two) times daily.   TYLENOL  SINUS CONGESTION/PAIN PO Take 1 tablet by mouth 2 (two) times daily as needed (Congestion).        Contact information for after-discharge care     Destination      [facility] Preferred SNF .   Service: Skilled Nursing Contact information: [address] [place]  [phone]                    Allergies  Allergen Reactions   Tramadol Other (See Comments)    Hyperanxiety  Panic attacks    Aspirin     Edema    Betadine [Povidone Iodine] Hives   Cephalosporins Diarrhea and Itching    TOLERATED CEFAZOLIN  02/10/23 (Original allergy to cephalexin  - itching)   Codeine     Central nervous system    Cortisone     Unknown   Doxycycline      Other Reaction(s): GI Intolerance, severe diarrhea   Gluten Meal     unknown   Mushroom Extract Complex (Do Not Select)     Fungi infection    Naproxen Swelling   Onion     Reflux   Prednisone     oral   Premarin [Conjugated Estrogens] Hives    Oral premarin    Tine Test [Old Tuberculin] Swelling   Tomato     Reflux   Valium [Diazepam] Nausea Only    Stop breathing    Versed  [Midazolam ] Nausea And Vomiting    Out of body experience    Wound Dressing Adhesive Hives   Iodine Rash    Consultations: Podiatry    Procedures/Studies: DG Knee 1-2 Views Left Result Date: 02/11/2023 CLINICAL DATA:  Left knee pain after fall, pain in patellar region EXAM: LEFT KNEE - 1-2 VIEW COMPARISON:  None Available. FINDINGS: Frontal and lateral views of the left knee demonstrate no fracture, subluxation, or dislocation. Joint spaces are well preserved. No joint effusion. Soft tissues are unremarkable. IMPRESSION: 1. Unremarkable left knee. Electronically Signed   By: Ozell Daring M.D.   On: 02/11/2023 18:41   DG Ankle 2 Views Right Result Date: 02/10/2023 CLINICAL DATA:  Right Achilles tendon repair EXAM: RIGHT ANKLE - 2 VIEW COMPARISON:  02/10/2023 12:55 a.m. FINDINGS: Frontal and lateral views of the right ankle are obtained. Interval removal of cast material. Postsurgical changes from calcaneal osteotomy again noted. No acute fracture,  subluxation, or dislocation. Joint spaces are relatively well preserved. There is  diffuse soft tissue edema unchanged. IMPRESSION: 1. Diffuse soft tissue edema. 2. Postsurgical changes of the right calcaneus.  No acute fracture. Electronically Signed   By: Ozell Daring M.D.   On: 02/10/2023 16:05   DG MINI C-ARM IMAGE ONLY Result Date: 02/10/2023 There is no interpretation for this exam.  This order is for images obtained during a surgical procedure.  Please See Surgeries Tab for more information regarding the procedure.   US  OR NERVE BLOCK-IMAGE ONLY [facility]) Result Date: 02/10/2023 There is no interpretation for this exam.  This order is for images obtained during a surgical procedure.  Please See Surgeries Tab for more information regarding the procedure.   DG Ankle Complete Right Result Date: 02/10/2023 CLINICAL DATA:  Status post fall. EXAM: RIGHT ANKLE - COMPLETE 3+ VIEW COMPARISON:  February 03, 2023 FINDINGS: The right ankle was imaged in a fiberglass cast with subsequently obscured osseous and soft tissue detail. There is no evidence of an acute fracture, dislocation, or joint effusion. There is no evidence of arthropathy or other focal bone abnormality. Moderate severity diffuse soft tissue swelling is seen. IMPRESSION: Moderate severity diffuse soft tissue swelling without evidence of an acute osseous abnormality. Electronically Signed   By: Suzen Dials M.D.   On: 02/10/2023 01:11   DG Finger Index Left Result Date: 02/10/2023 CLINICAL DATA:  Status post fall. EXAM: LEFT INDEX FINGER 2+V COMPARISON:  None Available. FINDINGS: There is no evidence of fracture or dislocation. Moderate severity degenerative changes seen involving the PIP and DIP joints. Mild diffuse soft tissue swelling is noted. IMPRESSION: Mild diffuse soft tissue swelling without evidence of an acute osseous abnormality. Electronically Signed   By: Suzen Dials M.D.   On: 02/10/2023 01:08   DG Shoulder  Right Result Date: 02/10/2023 CLINICAL DATA:  Status post fall. EXAM: RIGHT SHOULDER - 2+ VIEW COMPARISON:  None Available. FINDINGS: There is a tiny area of cortical irregularity seen along the greater tubercle of the right humeral head. There is no evidence of dislocation. There is no evidence of arthropathy or other focal bone abnormality. Soft tissues are unremarkable. IMPRESSION: Findings suspicious for a nondisplaced fracture of the greater tubercle of the right humeral head. Correlation with physical examination is recommended to determine the presence of point tenderness. Subsequent CT evaluation is recommended if acute fracture remains of clinical concern. Electronically Signed   By: Suzen Dials M.D.   On: 02/10/2023 01:06   MR FOOT RIGHT W WO CONTRAST Result Date: 02/03/2023 CLINICAL DATA:  Status post right calcaneal osteotomy 2 days ago. Severe pain after feeling a pop. EXAM: MRI OF THE RIGHT FOREFOOT WITHOUT AND WITH CONTRAST TECHNIQUE: Multiplanar, multisequence MR imaging of the right ankle and hindfoot was performed before and after the administration of intravenous contrast. CONTRAST:  10mL GADAVIST  GADOBUTROL  1 MMOL/ML IV SOLN COMPARISON:  Same day radiographs of the right ankle at 3:17 p.m. FINDINGS: Bones/Joint/Cartilage Status post calcaneal osteotomy with 4 suture anchors noted within the calcaneal tuberosity. No acute fracture or dislocation. Normal alignment. No joint effusion. No abnormal marrow enhancement. Joint space narrowing with subchondral edema noted at the calcaneocuboid articulation as well as the first through fifth TMT joints. Ligaments The deltoid ligamentous complex is grossly intact. The anterior and posterior talofibular ligaments are intact. Lisfranc ligament is intact. Muscles and Tendons There is a complete retracted insertional tear of the distal Achilles tendon. There is approximately 3.2 cm of retraction from the distal Achilles tendon calcaneal insertion  (series 8, image 15)  with intervening heterogenous T2 hyperintense fluid, favored to represent edema with blood products. Flexor, peroneal and extensor compartment tendons are intact. Muscles are normal. Soft tissue Subcutaneous edema surrounding the distal Achilles tendon tear, described above. Postoperative changes along the posterior right lower extremity and ankle. Subcutaneous edema is noted medially and laterally extending into the visualized forefoot. No loculated fluid collection. IMPRESSION: 1. Complete retracted insertional tear of the distal Achilles tendon with approximately 3.2 cm of retraction. 2. Postoperative changes related to recent calcaneal osteotomy. 3. Osteoarthritis of the midfoot. Electronically Signed   By: Harrietta Sherry M.D.   On: 02/03/2023 20:21   DG Ankle Complete Right Result Date: 02/03/2023 CLINICAL DATA:  Right ankle injury, status post calcaneal osteotomy EXAM: RIGHT ANKLE - COMPLETE 3+ VIEW COMPARISON:  None Available. FINDINGS: Status post interval calcaneal osteotomy. No fracture or dislocation. Evaluation of fine bony detail limited by overlying cast material. Diffuse soft tissue edema about the ankle. IMPRESSION: 1. Status post interval calcaneal osteotomy. 2. No fracture or dislocation. Evaluation of fine bony detail limited by overlying cast material. 3. Diffuse soft tissue edema about the ankle. Electronically Signed   By: Marolyn JONETTA Jaksch M.D.   On: 02/03/2023 16:43   (Echo, Carotid, EGD, Colonoscopy, ERCP)    Subjective: Pt c/o ankle pain intermittently    Discharge Exam: Vitals:   02/13/23 2319 02/14/23 0804  BP: (!) 143/75 (!) 158/64  Pulse: 76 70  Resp: 19 20  Temp: 99 F (37.2 C) 97.6 F (36.4 C)  SpO2: 97% 95%   Vitals:   02/13/23 1659 02/13/23 2021 02/13/23 2319 02/14/23 0804  BP: (!) 152/72 (!) 166/46 (!) 143/75 (!) 158/64  Pulse: 65 89 76 70  Resp: 20 19 19 20   Temp: 97.8 F (36.6 C) 97.8 F (36.6 C) 99 F (37.2 C) 97.6 F (36.4 C)   TempSrc:  Oral    SpO2: 97% 98% 97% 95%  Weight:      Height:        General: Pt is alert, awake, not in acute distress Cardiovascular: S1/S2 +, no rubs, no gallops Respiratory: CTA bilaterally, no wheezing, no rhonchi Abdominal: Soft, NT, obese, bowel sounds + Extremities:no cyanosis    The results of significant diagnostics from this hospitalization (including imaging, microbiology, ancillary and laboratory) are listed below for reference.     Microbiology: No results found for this or any previous visit (from the past 240 hours).   Labs: BNP (last 3 results) Recent Labs    12/04/22 2159  BNP 28.8   Basic Metabolic Panel: Recent Labs  Lab 02/10/23 1310 02/11/23 0448  NA 139 137  K 4.5 3.9  CL 108 105  CO2 20* 24  GLUCOSE 158* 138*  BUN 17 15  CREATININE 0.78 0.69  CALCIUM 8.8* 8.4*   Liver Function Tests: Recent Labs  Lab 02/10/23 1310 02/11/23 0448  AST 42* 31  ALT 36 30  ALKPHOS 63 57  BILITOT 0.5 0.5  PROT 6.5 5.8*  ALBUMIN 3.7 3.4*   No results for input(s): LIPASE, AMYLASE in the last 168 hours. No results for input(s): AMMONIA in the last 168 hours. CBC: Recent Labs  Lab 02/10/23 1310 02/11/23 0448  WBC 7.9 9.3  NEUTROABS 6.1  --   HGB 11.8* 10.7*  HCT 36.9 32.8*  MCV 90.2 90.1  PLT 216 259   Cardiac Enzymes: No results for input(s): CKTOTAL, CKMB, CKMBINDEX, TROPONINI in the last 168 hours. BNP: Invalid input(s): POCBNP CBG: No results for input(s):  GLUCAP in the last 168 hours. D-Dimer No results for input(s): DDIMER in the last 72 hours. Hgb A1c No results for input(s): HGBA1C in the last 72 hours. Lipid Profile No results for input(s): CHOL, HDL, LDLCALC, TRIG, CHOLHDL, LDLDIRECT in the last 72 hours. Thyroid  function studies No results for input(s): TSH, T4TOTAL, T3FREE, THYROIDAB in the last 72 hours.  Invalid input(s): FREET3 Anemia work up No results for input(s):  VITAMINB12, FOLATE, FERRITIN, TIBC, IRON, RETICCTPCT in the last 72 hours. Urinalysis    Component Value Date/Time   COLORURINE YELLOW 10/17/2021 2240   APPEARANCEUR CLEAR 10/17/2021 2240   LABSPEC 1.015 10/17/2021 2240   PHURINE 6.0 10/17/2021 2240   GLUCOSEU NEGATIVE 10/17/2021 2240   HGBUR NEGATIVE 10/17/2021 2240   BILIRUBINUR NEGATIVE 10/17/2021 2240   KETONESUR NEGATIVE 10/17/2021 2240   PROTEINUR NEGATIVE 10/17/2021 2240   NITRITE NEGATIVE 10/17/2021 2240   LEUKOCYTESUR TRACE (A) 10/17/2021 2240   Sepsis Labs Recent Labs  Lab 02/10/23 1310 02/11/23 0448  WBC 7.9 9.3   Microbiology No results found for this or any previous visit (from the past 240 hours).   Time coordinating discharge: Over 30 minutes  SIGNED:   Anthony CHRISTELLA Pouch, MD  [facility] 02/14/2023, 1:34 PM Pager   If 7PM-7AM, please contact night-coverage [email or web address]

## 2023-02-14 NOTE — Plan of Care (Signed)

## 2023-02-14 NOTE — Progress Notes (Signed)
Pt requested RT to put CPAP on her. RN notified RT, and RT said that she will come and put the CPAP on the pt.

## 2023-02-14 NOTE — TOC Progression Note (Signed)
 Transition of Care [facility]) - Progression Note    Patient Details  Name: Tamara Morgan MRN: [id number] Date of Birth: 1955/11/24  [facility] CM/SW Contact  Royanne JINNY Bernheim, RN Phone Number: 02/14/2023, 1:51 PM  Clinical Narrative:    The patient will go to [place] room, 3243, EMS called to arrange transport.  She has her own CPAP in the room that will go with her, EMS is aware, she will notify her family   Expected Discharge Plan: Skilled Nursing Facility Barriers to Discharge: Continued Medical Work up  Expected Discharge Plan and Services     Post Acute Care Choice: Skilled Nursing Facility Living arrangements for the past 2 months: Single Family Home Expected Discharge Date: 02/14/23                                     Social Determinants of Health (SDOH) Interventions SDOH Screenings   Food Insecurity: No Food Insecurity (02/10/2023)  Housing: High Risk (02/10/2023)  Transportation Needs: No Transportation Needs (02/10/2023)  Utilities: Not At Risk (02/10/2023)  Depression (PHQ2-9): Low Risk  (04/14/2022)  Financial Resource Strain: Low Risk  (01/01/2023)   Received from [facility]  Physical Activity: Insufficiently Active (01/01/2023)   Received from [facility]  Social Connections: Socially Integrated (01/01/2023)   Received from [facility]  Stress: No Stress Concern Present (01/01/2023)   Received from [facility]  Tobacco Use: Low Risk  (02/10/2023)    Readmission Risk Interventions     No data to display

## 2023-02-14 NOTE — Plan of Care (Signed)
 Patient discharged per MD orders at this time.All dc instructions,education and medications reviewed with the patient.Pt expressed understanding and will comply with dc instructions. Follow up appointments was also communicated to the patient.no verbal c/o or any ssx of distress noted.Pt was discharged to the [facility] nursing and rehabilitation facility for STR PT/OT services per order.report was called to staff nurse Luke before transport.Pt waiting for EMS transport at this time.incoming nurse updated on Pt dc status.

## 2023-02-15 DIAGNOSIS — G4733 Obstructive sleep apnea (adult) (pediatric): Secondary | ICD-10-CM | POA: Diagnosis not present

## 2023-02-15 DIAGNOSIS — I10 Essential (primary) hypertension: Secondary | ICD-10-CM | POA: Diagnosis not present

## 2023-02-15 DIAGNOSIS — J309 Allergic rhinitis, unspecified: Secondary | ICD-10-CM | POA: Diagnosis not present

## 2023-02-15 DIAGNOSIS — I4891 Unspecified atrial fibrillation: Secondary | ICD-10-CM | POA: Diagnosis not present

## 2023-02-16 ENCOUNTER — Ambulatory Visit (INDEPENDENT_AMBULATORY_CARE_PROVIDER_SITE_OTHER): Payer: Medicare Other | Admitting: Podiatry

## 2023-02-16 ENCOUNTER — Encounter: Payer: Self-pay | Admitting: Podiatry

## 2023-02-16 DIAGNOSIS — S86011D Strain of right Achilles tendon, subsequent encounter: Secondary | ICD-10-CM

## 2023-02-16 DIAGNOSIS — Z91199 Patient's noncompliance with other medical treatment and regimen due to unspecified reason: Secondary | ICD-10-CM

## 2023-02-16 NOTE — Progress Notes (Signed)
  Subjective:  Patient ID: Tamara Morgan, female    DOB: [birth year],  MRN: [id number]   DOS: 02/10/2023 Procedure: 1.  FHL tendon transfer to posterior calcaneus, right ankle 2.  Repair of Achilles tendon rupture, right ankle  68 y.o. female seen for post op check.  Patient seen for first office postop visit.  Has been at a nursing facility.  Patient says the nursing facility has not been conducive to her needs for several reasons.  Open to get a letter from me that allows her to go to any facility.    Review of Systems: Negative except as noted in the HPI. Denies N/V/F/Ch.   Objective:   There were no vitals filed for this visit.  There is no height or weight on file to calculate BMI. Constitutional Well developed. Well nourished.  Vascular Foot warm and well perfused. Capillary refill normal to all digits.   No calf pain with palpation  Neurologic Normal speech. Oriented to person, place, and time. Epicritic sensation intact to toes  Dermatologic Right second toe well-healed with sutures intact. Posterior aspect of the right heel with sutures intact without evidence of dehiscence erythema or drainage.   Orthopedic: Wiggles toes and sensation intact to all toes.  Absent hallux plantarflexion as expected   Radiographs: Postsurgical changes including posterior calcaneal exostectomy no acute fracture identified No new radiographs taken today  Pathology: N/A  Micro: N/A  Assessment:   Right Achilles tendon rupture status post prior retrocalcaneal exostectomy with secondary repair of Achilles now status post revision tendon repair and FHL tendon transfer  Plan:  Patient was evaluated and treated and all questions answered.  PO 1 week s/p FHL tendon transfer and right Achilles tendon rupture repair -Progressing well postop she has maintain nonweightbearing in the hospital and says pain is controlled -does need letter stating the patient is not safe in her current nursing  facility recommend she go to any facility -Patient and nursing home continue as not able to maintain nonweightbearing status at home -XR: Deferred at this visit will consider next appointment -WB Status: Nonweightbearing in posterior splint to the right lower extremity -Sutures: To remain intact to posterior heel but will take out sutures from second toe. -Medications/ABX: Keflex  x 5 days -Overall progressing well pain is controlled and she has maintain well-appearing status and nursing facility will continue with current plan.  Maintain nonweightbearing splint dressing was changed with Betadine dressing applied in the back of the heel.  Follow-up in 1 week        [name], DPM [facility]

## 2023-02-17 ENCOUNTER — Telehealth: Payer: Self-pay

## 2023-02-17 DIAGNOSIS — I10 Essential (primary) hypertension: Secondary | ICD-10-CM | POA: Diagnosis not present

## 2023-02-17 DIAGNOSIS — J309 Allergic rhinitis, unspecified: Secondary | ICD-10-CM | POA: Diagnosis not present

## 2023-02-17 DIAGNOSIS — I4891 Unspecified atrial fibrillation: Secondary | ICD-10-CM | POA: Diagnosis not present

## 2023-02-17 DIAGNOSIS — G4733 Obstructive sleep apnea (adult) (pediatric): Secondary | ICD-10-CM | POA: Diagnosis not present

## 2023-02-17 NOTE — Telephone Encounter (Signed)
 Patient called yesterday at 4:55 and this morning at 9:10. She is currentl\y in [facility] - the facility is not sufficient for her NWB needs -no bed rails, no way to elevate her surgery foot, and no bedside commodes. In order to switch, we have to call [employer] for prior auth [phone]. This was discussed at her last office visit. [facility] facility was suggested.

## 2023-02-18 DIAGNOSIS — J309 Allergic rhinitis, unspecified: Secondary | ICD-10-CM | POA: Diagnosis not present

## 2023-02-18 DIAGNOSIS — G4733 Obstructive sleep apnea (adult) (pediatric): Secondary | ICD-10-CM | POA: Diagnosis not present

## 2023-02-18 DIAGNOSIS — I10 Essential (primary) hypertension: Secondary | ICD-10-CM | POA: Diagnosis not present

## 2023-02-18 DIAGNOSIS — I4891 Unspecified atrial fibrillation: Secondary | ICD-10-CM | POA: Diagnosis not present

## 2023-02-21 DIAGNOSIS — I10 Essential (primary) hypertension: Secondary | ICD-10-CM | POA: Diagnosis not present

## 2023-02-21 DIAGNOSIS — J309 Allergic rhinitis, unspecified: Secondary | ICD-10-CM | POA: Diagnosis not present

## 2023-02-21 DIAGNOSIS — I471 Supraventricular tachycardia, unspecified: Secondary | ICD-10-CM | POA: Diagnosis not present

## 2023-02-21 DIAGNOSIS — I4891 Unspecified atrial fibrillation: Secondary | ICD-10-CM | POA: Diagnosis not present

## 2023-02-21 DIAGNOSIS — E063 Autoimmune thyroiditis: Secondary | ICD-10-CM | POA: Diagnosis not present

## 2023-02-21 DIAGNOSIS — G4733 Obstructive sleep apnea (adult) (pediatric): Secondary | ICD-10-CM | POA: Diagnosis not present

## 2023-02-22 ENCOUNTER — Encounter (HOSPITAL_COMMUNITY): Payer: Self-pay

## 2023-02-22 ENCOUNTER — Emergency Department (HOSPITAL_COMMUNITY)
Admission: EM | Admit: 2023-02-22 | Discharge: 2023-02-23 | Disposition: A | Discharge status: Home / Self Care | Payer: Medicare Other | Attending: Emergency Medicine | Admitting: Emergency Medicine

## 2023-02-22 ENCOUNTER — Emergency Department (HOSPITAL_COMMUNITY): Payer: Medicare Other

## 2023-02-22 ENCOUNTER — Other Ambulatory Visit: Payer: Self-pay

## 2023-02-22 DIAGNOSIS — I700 Atherosclerosis of aorta: Secondary | ICD-10-CM | POA: Diagnosis not present

## 2023-02-22 DIAGNOSIS — E785 Hyperlipidemia, unspecified: Secondary | ICD-10-CM | POA: Diagnosis not present

## 2023-02-22 DIAGNOSIS — B974 Respiratory syncytial virus as the cause of diseases classified elsewhere: Secondary | ICD-10-CM | POA: Diagnosis not present

## 2023-02-22 DIAGNOSIS — K449 Diaphragmatic hernia without obstruction or gangrene: Secondary | ICD-10-CM | POA: Insufficient documentation

## 2023-02-22 DIAGNOSIS — I4891 Unspecified atrial fibrillation: Secondary | ICD-10-CM | POA: Diagnosis not present

## 2023-02-22 DIAGNOSIS — R079 Chest pain, unspecified: Secondary | ICD-10-CM | POA: Diagnosis not present

## 2023-02-22 DIAGNOSIS — E063 Autoimmune thyroiditis: Secondary | ICD-10-CM | POA: Insufficient documentation

## 2023-02-22 DIAGNOSIS — R0602 Shortness of breath: Secondary | ICD-10-CM | POA: Insufficient documentation

## 2023-02-22 DIAGNOSIS — J309 Allergic rhinitis, unspecified: Secondary | ICD-10-CM | POA: Diagnosis not present

## 2023-02-22 DIAGNOSIS — Z7901 Long term (current) use of anticoagulants: Secondary | ICD-10-CM | POA: Insufficient documentation

## 2023-02-22 DIAGNOSIS — K760 Fatty (change of) liver, not elsewhere classified: Secondary | ICD-10-CM | POA: Diagnosis not present

## 2023-02-22 DIAGNOSIS — I341 Nonrheumatic mitral (valve) prolapse: Secondary | ICD-10-CM | POA: Insufficient documentation

## 2023-02-22 DIAGNOSIS — Z20822 Contact with and (suspected) exposure to covid-19: Secondary | ICD-10-CM | POA: Insufficient documentation

## 2023-02-22 DIAGNOSIS — G4733 Obstructive sleep apnea (adult) (pediatric): Secondary | ICD-10-CM | POA: Diagnosis not present

## 2023-02-22 DIAGNOSIS — R0789 Other chest pain: Secondary | ICD-10-CM | POA: Diagnosis not present

## 2023-02-22 DIAGNOSIS — I10 Essential (primary) hypertension: Secondary | ICD-10-CM | POA: Diagnosis not present

## 2023-02-22 DIAGNOSIS — Z79899 Other long term (current) drug therapy: Secondary | ICD-10-CM | POA: Insufficient documentation

## 2023-02-22 DIAGNOSIS — R509 Fever, unspecified: Secondary | ICD-10-CM | POA: Diagnosis not present

## 2023-02-22 LAB — RESP PANEL BY RT-PCR (RSV, FLU A&B, COVID)  RVPGX2
Influenza A by PCR: NEGATIVE
Influenza B by PCR: NEGATIVE
Resp Syncytial Virus by PCR: POSITIVE — AB
SARS Coronavirus 2 by RT PCR: NEGATIVE

## 2023-02-22 LAB — BASIC METABOLIC PANEL
Anion gap: 10 (ref 5–15)
BUN: 16 mg/dL (ref 8–23)
CO2: 25 mmol/L (ref 22–32)
Calcium: 9.5 mg/dL (ref 8.9–10.3)
Chloride: 104 mmol/L (ref 98–111)
Creatinine, Ser: 0.85 mg/dL (ref 0.44–1.00)
GFR, Estimated: >60 mL/min (ref >60)
Glucose, Bld: 121 mg/dL — ABNORMAL HIGH (ref 70–99)
Potassium: 4.2 mmol/L (ref 3.5–5.1)
Sodium: 139 mmol/L (ref 135–145)

## 2023-02-22 LAB — URINALYSIS, ROUTINE W REFLEX MICROSCOPIC
Bilirubin Urine: NEGATIVE
Glucose, UA: NEGATIVE mg/dL
Hgb urine dipstick: NEGATIVE
Ketones, ur: NEGATIVE mg/dL
Leukocytes,Ua: NEGATIVE
Nitrite: NEGATIVE
Protein, ur: NEGATIVE mg/dL
Specific Gravity, Urine: 1.008 (ref 1.005–1.030)
pH: 6 (ref 5.0–8.0)

## 2023-02-22 LAB — TROPONIN I (HIGH SENSITIVITY)
Troponin I (High Sensitivity): 7 ng/L (ref <18)
Troponin I (High Sensitivity): 6 ng/L (ref <18)

## 2023-02-22 LAB — CBC
HCT: 41.3 % (ref 36.0–46.0)
Hemoglobin: 13.2 g/dL (ref 12.0–15.0)
MCH: 28.6 pg (ref 26.0–34.0)
MCHC: 32 g/dL (ref 30.0–36.0)
MCV: 89.4 fL (ref 80.0–100.0)
Platelets: 280 10*3/uL (ref 150–400)
RBC: 4.62 MIL/uL (ref 3.87–5.11)
RDW: 12.8 % (ref 11.5–15.5)
WBC: 8 10*3/uL (ref 4.0–10.5)
nRBC: 0 % (ref 0.0–0.2)

## 2023-02-22 LAB — BRAIN NATRIURETIC PEPTIDE: B Natriuretic Peptide: 10.2 pg/mL (ref 0.0–100.0)

## 2023-02-22 LAB — LACTIC ACID, PLASMA
Lactic Acid, Venous: 1.5 mmol/L (ref 0.5–1.9)
Lactic Acid, Venous: 1.3 mmol/L (ref 0.5–1.9)

## 2023-02-22 MED ORDER — ACETAMINOPHEN 500 MG PO TABS
1000.0000 mg | ORAL_TABLET | ORAL | Status: AC
  Administered 2023-02-22: 1000 mg via ORAL
  Filled 2023-02-22: qty 2

## 2023-02-22 MED ORDER — IOHEXOL 350 MG/ML SOLN
75.0000 mL | Freq: Once | INTRAVENOUS | Status: AC | PRN
  Administered 2023-02-22: 75 mL via INTRAVENOUS

## 2023-02-22 NOTE — ED Provider Notes (Signed)
 [facility] Provider Note   CSN: [id number] Arrival date & time: 02/22/23  1438     History  Chief Complaint  Patient presents with   Chest Pain    Tamara Morgan is a 68 y.o. female.  68 year old female with history of mitral valve prolapse, atrial fibrillation on Eliquis , hypertension, hyperlipidemia, and Hashimoto's disease who presents to the emergency department with chest pain.  Patient is at [facility] re[facility] after she had foot surgery on 02/10/2023.  On Sunday he had an episode of chest tightness.  Today started experiencing sharp left-sided chest pain under her breath.  Pleuritic.  Not positional.  Has some mild shortness of breath.  No cough.  Was found to have a fever here but did not have one before.  Says that she has had pericarditis in the past and that this feels similar.  Says that she is currently on Eliquis        Home Medications Prior to Admission medications   Medication Sig Start Date End Date Taking? Authorizing Provider  acetaminophen  (TYLENOL ) 325 MG tablet Take 650 mg by mouth every 6 (six) hours as needed for moderate pain (pain score 4-6).   Yes [provider]  apixaban  (ELIQUIS ) 5 MG TABS tablet Take 1 tablet (5 mg total) by mouth 2 (two) times daily. 02/14/23  Yes Trudy Anthony HERO, MD  Azelastine HCl 137 MCG/SPRAY SOLN Place 1-2 sprays into the nose 2 (two) times daily as needed (allergies).   Yes [provider]  fluticasone (FLONASE) 50 MCG/ACT nasal spray Place 2 sprays into both nostrils daily.   Yes [provider]  fluticasone (FLOVENT HFA) 110 MCG/ACT inhaler Inhale 2 puffs into the lungs every 12 (twelve) hours as needed (sob, wheezing).   Yes [provider]  losartan  (COZAAR ) 25 MG tablet Take 1 tablet (25 mg total) by mouth daily. 02/15/23  Yes Trudy Anthony HERO, MD  Melatonin 10 MG TABS Take 20 mg by mouth at bedtime.   Yes [provider]  metoprolol  succinate (TOPROL -XL) 25 MG 24 hr tablet Take 1 tablet (25 mg total) by mouth daily. 08/11/20 02/22/23 Yes Garrick Charleston, MD  oxyCODONE -acetaminophen  (PERCOCET/ROXICET) 5-325 MG tablet Take 1 tablet by mouth every 6 (six) hours as needed for severe pain (pain score 7-10).   Yes [provider]  pantoprazole  (PROTONIX ) 40 MG tablet Take 1 tablet (40 mg total) by mouth every evening. 06/28/22  Yes Medina-Vargas, Monina C, NP  Bacillus Coagulans-Inulin (PROBIOTIC-PREBIOTIC PO) Take 2 capsules by mouth daily. Post biotic 3 complete Patient not taking: Reported on 02/22/2023    [provider]  Berberine Chloride (BERBERINE HCI PO) Take 1,600 mg by mouth in the morning. 800 mg each Patient not taking: Reported on 02/22/2023    [provider]  Carboxymethylcellul-Glycerin (REFRESH TEARS PF OP) Place 1 drop into both eyes daily as needed (Dry eye). Patient not taking: Reported on 02/22/2023    [provider]  chlorhexidine  (PERIDEX ) 0.12 % solution Use as directed 15 mLs in the mouth or throat daily as needed (tongue). Patient not taking: Reported on 02/22/2023    [provider]  diphenhydrAMINE -PE-APAP (ALLERGY RELIEF PLUS SINUS PO) Take 25 mg by mouth at bedtime as needed (Congestion). Patient not taking: Reported on 02/22/2023    [provider]  guaiFENesin -dextromethorphan (ROBITUSSIN DM) 100-10 MG/5ML syrup Take 5 mLs by mouth 3 (three) times daily as needed for cough. Patient not taking: Reported  on 02/22/2023 12/04/22   Patsey Lot, MD  hydrocortisone  cream 0.5 % Apply BID underarm rash Patient not taking: Reported on 02/22/2023 02/14/23   Trudy Anthony HERO, MD  Misc Natural Products (TURMERIC CURCUMIN) CAPS Take 1 capsule by mouth 2 (two) times daily. Patient not taking: Reported on 02/22/2023    [provider]  Nutritional Supplements (COLD AND FLU PO) Take 2 tablets by mouth 2 (two) times daily as needed  (Cold). Patient not taking: Reported on 02/22/2023    [provider]  OVER THE COUNTER MEDICATION Take 1 tablet by mouth 2 (two) times daily. Joint 30 + advance supplement Patient not taking: Reported on 02/22/2023    [provider]  OVER THE COUNTER MEDICATION Take 2 capsules by mouth in the morning. Thyroid  hormone formulia Patient not taking: Reported on 02/22/2023    [provider]  OVER THE COUNTER MEDICATION Take 1 capsule by mouth 2 (two) times daily. Immune Defence Patient not taking: Reported on 02/22/2023    [provider]  OVER THE COUNTER MEDICATION Take 1 capsule by mouth in the morning. C CL balance Formula Patient not taking: Reported on 02/22/2023    [provider]  OVER THE COUNTER MEDICATION Take 2 capsules by mouth daily with lunch. Lymph System Patient not taking: Reported on 02/22/2023    [provider]  OVER THE COUNTER MEDICATION Take 3 capsules by mouth at bedtime. Calm Support Patient not taking: Reported on 02/22/2023    [provider]  Phenylephrine -Acetaminophen  (TYLENOL  SINUS CONGESTION/PAIN PO) Take 1 tablet by mouth 2 (two) times daily as needed (Congestion). Patient not taking: Reported on 02/22/2023    [provider]  Povidone, PF, (IVIZIA DRY EYES) 0.5 % SOLN Place 2 drops into both eyes at bedtime as needed (Dry eyes). Patient not taking: Reported on 02/22/2023    [provider]  Vitamin D-Vitamin K (K2-D3 MAX PO) Take 1 capsule by mouth daily. Patient not taking: Reported on 02/22/2023    [provider]      Allergies    Tramadol, Aspirin, Betadine [povidone iodine], Cephalosporins, Codeine, Cortisone, Doxycycline , Gluten meal, Mushroom extract complex (do not select), Naproxen, Onion, Prednisone, Premarin [conjugated estrogens], Tine test [old tuberculin], Tomato, Valium [diazepam], Versed  [midazolam ], Wound dressing adhesive, and Iodine    Review of Systems   Review of  Systems  Physical Exam Updated Vital Signs BP (!) 155/58   Pulse 65   Temp 98.3 F (36.8 C)   Resp (!) 26   Ht 5' 7 (1.702 m)   Wt 113.4 kg   SpO2 95%   BMI 39.16 kg/m  Physical Exam Vitals and nursing note reviewed.  Constitutional:      General: She is not in acute distress.    Appearance: She is well-developed.  HENT:     Head: Normocephalic and atraumatic.     Right Ear: External ear normal.     Left Ear: External ear normal.     Nose: Nose normal.  Eyes:     Extraocular Movements: Extraocular movements intact.     Conjunctiva/sclera: Conjunctivae normal.     Pupils: Pupils are equal, round, and reactive to light.  Cardiovascular:     Rate and Rhythm: Normal rate and regular rhythm.     Heart sounds: No murmur heard.    Comments: Chest pain not reproducible.  No rashes present. Pulmonary:     Effort: Pulmonary effort is normal. No respiratory distress.     Breath sounds:  Normal breath sounds.  Musculoskeletal:     Cervical back: Normal range of motion and neck supple.     Comments: Right upper extremity in sling.  Right lower extremity with cast in place from the surgery.  Skin:    General: Skin is warm and dry.  Neurological:     Mental Status: She is alert and oriented to person, place, and time. Mental status is at baseline.  Psychiatric:        Mood and Affect: Mood normal.     ED Results / Procedures / Treatments   Labs (all labs ordered are listed, but only abnormal results are displayed) Labs Reviewed  RESP PANEL BY RT-PCR (RSV, FLU A&B, COVID)  RVPGX2 - Abnormal; Notable for the following components:      Result Value   Resp Syncytial Virus by PCR POSITIVE (*)    All other components within normal limits  BASIC METABOLIC PANEL - Abnormal; Notable for the following components:   Glucose, Bld 121 (*)    All other components within normal limits  URINALYSIS, ROUTINE W REFLEX MICROSCOPIC - Abnormal; Notable for the following components:   Color,  Urine STRAW (*)    All other components within normal limits  CBC  BRAIN NATRIURETIC PEPTIDE  LACTIC ACID, PLASMA  LACTIC ACID, PLASMA  TROPONIN I (HIGH SENSITIVITY)  TROPONIN I (HIGH SENSITIVITY)    EKG EKG Interpretation Date/Time:  Wednesday February 22 2023 14:57:18 EST Ventricular Rate:  67 PR Interval:  194 QRS Duration:  97 QT Interval:  401 QTC Calculation: 424 R Axis:   75  Text Interpretation: Sinus rhythm Low voltage, precordial leads Confirmed by Yolande Charleston [phone]) on 02/22/2023 3:32:51 PM  Radiology CT Angio Chest Pulmonary Embolism (PE) W or WO Contrast Result Date: 02/22/2023 CLINICAL DATA:  Pulmonary embolism (PE) suspected, high prob. Chest pain. EXAM: CT ANGIOGRAPHY CHEST WITH CONTRAST TECHNIQUE: Multidetector CT imaging of the chest was performed using the standard protocol during bolus administration of intravenous contrast. Multiplanar CT image reconstructions and MIPs were obtained to evaluate the vascular anatomy. RADIATION DOSE REDUCTION: This exam was performed according to the departmental dose-optimization program which includes automated exposure control, adjustment of the mA and/or kV according to patient size and/or use of iterative reconstruction technique. CONTRAST:  75mL OMNIPAQUE  IOHEXOL  350 MG/ML SOLN COMPARISON:  None Available. FINDINGS: Cardiovascular: No filling defects in the pulmonary arteries to suggest pulmonary emboli. Heart is normal size. Aorta is normal caliber. Moderate aortic atherosclerosis. Mediastinum/Nodes: No mediastinal, hilar, or axillary adenopathy. Trachea and esophagus are unremarkable. Thyroid  unremarkable. Small hiatal hernia. Lungs/Pleura: No confluent opacities or effusions. Upper Abdomen: No acute findings. Diffuse low-density throughout the liver suggesting fatty infiltration. Musculoskeletal: Chest wall soft tissues are unremarkable. No acute bony abnormality. Review of the MIP images confirms the above findings. IMPRESSION:  No evidence of pulmonary embolus. No acute cardiopulmonary disease. Hepatic steatosis. Small hiatal hernia. Aortic Atherosclerosis (ICD10-I70.0). Electronically Signed   By: Franky Crease M.D.   On: 02/22/2023 21:12    Procedures Procedures    Medications Ordered in ED Medications  acetaminophen  (TYLENOL ) tablet 1,000 mg (1,000 mg Oral Given 02/22/23 1924)  iohexol  (OMNIPAQUE ) 350 MG/ML injection 75 mL (75 mLs Intravenous Contrast Given 02/22/23 2005)    ED Course/ Medical Decision Making/ A&P Clinical Course as of 02/24/23 1430  Wed Feb 22, 2023  1949 Respiratory Syncytial Virus by PCR(!): POSITIVE [RP]    Clinical Course User Index [RP] Yolande Charleston BROCKS, MD  Medical Decision Making Amount and/or Complexity of Data Reviewed Labs: ordered. Decision-making details documented in ED Course. Radiology: ordered.  Risk OTC drugs. Prescription drug management.   Idalis Hoelting is a 68 y.o. female with comorbidities that complicate the patient evaluation including mitral valve prolapse, atrial fibrillation on Eliquis , hypertension, hyperlipidemia, and Hashimoto's disease who presents to the emergency department with chest pain.    Initial Ddx:  MI, PE, pneumonia, dissection, pericarditis, costochondritis, reflux  MDM:  With the patient's chest discomfort will obtain EKG and troponins to evaluate for MI.  With the patient's surgery and description of chest pain is at higher risk for pulmonary embolism.  He is currently on anticoagulation so PE is less likely but there is still possibility of breakthrough clots will obtain a CTA.  Considered dissection but with their symmetric pulses, history, and description of the pain feel it is less likely.   Also considered pericarditis but description is unlikely and they do not have risk factors for this diagnosis.  Chest pain not reproducible so feel it costochondritis less likely.  Could potentially have URI with her  fever so will obtain COVID and flu.  Imaging will show if there is a pneumonia.  Plan:  Labs Troponin CTA chest  EKG RVP  ED Summary/Re-evaluation:  EKG and serial troponins unremarkable.  Patient found to have RSV.  Underwent a CTA that does not show evidence of PE or pneumonia.  Suspect that she is having pleurisy from her RSV.  Instructed to take Tylenol  and ibuprofen  for her pain.  Discharged back to her facility.  This patient presents to the ED for concern of complaints listed in HPI, this involves an extensive number of treatment options, and is a complaint that carries with it a high risk of complications and morbidity. Disposition including potential need for admission considered.   Dispo: DC to Facility  Additional history obtained from spouse Records reviewed Outpatient Clinic Notes The following labs were independently interpreted: Chemistry and show no acute abnormality I independently reviewed the following imaging with scope of interpretation limited to determining acute life threatening conditions related to emergency care: CT Chest and agree with the radiologist interpretation with the following exceptions: none I personally reviewed and interpreted cardiac monitoring: normal sinus rhythm  I personally reviewed and interpreted the pt's EKG: see above for interpretation  I have reviewed the patients home medications and made adjustments as needed   Final Clinical Impression(s) / ED Diagnoses Final diagnoses:  Chest pain, unspecified type    Rx / DC Orders ED Discharge Orders     None         Yolande Lamar BROCKS, MD 02/24/23 1430

## 2023-02-22 NOTE — ED Notes (Signed)
 Attempted to call Tamara Morgan, was transferred but extension was unavailable. Message left with call back number.

## 2023-02-22 NOTE — Discharge Instructions (Addendum)
 You were seen for chest pain in the emergency department.  You are found to have RSV.  At home, please Tylenol  and ibuprofen  for your pain.    Check your MyChart online for the results of any tests that had not resulted by the time you left the emergency department.    Follow-up with your primary doctor in 2-3 days regarding your visit.    Return immediately to the emergency department if you experience any of the following: Worsening pain, difficulty breathing, or any other concerning symptoms.    Thank you for visiting our Emergency Department. It was a pleasure taking care of you today.

## 2023-02-22 NOTE — ED Notes (Addendum)
 Lab called regarding the possibility of the result of the troponin that was sent being delayed due to machine difficulties. EDP Rondel Baton, MD notified.

## 2023-02-22 NOTE — ED Triage Notes (Signed)
 Pt BIB [facility] from [facility] for chest pain. Pt reports began around 1pm, sharp lower chest pain with no radiation, palpations, dizziness, or weakness. Reports mild SHOB. Hx mitral valve prolapse and a-fib; takes eliquis . No ASA and VSS per EMS.

## 2023-02-23 ENCOUNTER — Encounter: Payer: Medicare Other | Admitting: Podiatry

## 2023-02-23 ENCOUNTER — Telehealth (HOSPITAL_COMMUNITY): Payer: Self-pay

## 2023-02-23 DIAGNOSIS — I4891 Unspecified atrial fibrillation: Secondary | ICD-10-CM | POA: Diagnosis not present

## 2023-02-23 DIAGNOSIS — I10 Essential (primary) hypertension: Secondary | ICD-10-CM | POA: Diagnosis not present

## 2023-02-23 DIAGNOSIS — G4733 Obstructive sleep apnea (adult) (pediatric): Secondary | ICD-10-CM | POA: Diagnosis not present

## 2023-02-23 DIAGNOSIS — J309 Allergic rhinitis, unspecified: Secondary | ICD-10-CM | POA: Diagnosis not present

## 2023-02-23 NOTE — Telephone Encounter (Signed)
 Tamara Morgan, Nursing from [facility] called and requested pt.s discharge information.  Pt. Was seen in our ED on 02/22/2023, and was diagnosed with RSV.  The Rehab center did not receive any discharge paperwork.  The Patient informed them she had RSV  After speaking with Tamara, she informed that pt.s doctor was able to get the discharge information.

## 2023-02-24 DIAGNOSIS — G4733 Obstructive sleep apnea (adult) (pediatric): Secondary | ICD-10-CM | POA: Diagnosis not present

## 2023-02-24 DIAGNOSIS — I4891 Unspecified atrial fibrillation: Secondary | ICD-10-CM | POA: Diagnosis not present

## 2023-02-24 DIAGNOSIS — J309 Allergic rhinitis, unspecified: Secondary | ICD-10-CM | POA: Diagnosis not present

## 2023-02-24 DIAGNOSIS — I10 Essential (primary) hypertension: Secondary | ICD-10-CM | POA: Diagnosis not present

## 2023-02-27 DIAGNOSIS — I4891 Unspecified atrial fibrillation: Secondary | ICD-10-CM | POA: Diagnosis not present

## 2023-02-27 DIAGNOSIS — I10 Essential (primary) hypertension: Secondary | ICD-10-CM | POA: Diagnosis not present

## 2023-02-27 DIAGNOSIS — J309 Allergic rhinitis, unspecified: Secondary | ICD-10-CM | POA: Diagnosis not present

## 2023-02-27 DIAGNOSIS — G4733 Obstructive sleep apnea (adult) (pediatric): Secondary | ICD-10-CM | POA: Diagnosis not present

## 2023-02-28 ENCOUNTER — Ambulatory Visit (INDEPENDENT_AMBULATORY_CARE_PROVIDER_SITE_OTHER): Payer: Medicare Other | Admitting: Podiatry

## 2023-02-28 ENCOUNTER — Encounter: Payer: Self-pay | Admitting: Podiatry

## 2023-02-28 DIAGNOSIS — S86011D Strain of right Achilles tendon, subsequent encounter: Secondary | ICD-10-CM

## 2023-02-28 DIAGNOSIS — Z9889 Other specified postprocedural states: Secondary | ICD-10-CM

## 2023-02-28 MED ORDER — AMOXICILLIN-POT CLAVULANATE 875-125 MG PO TABS: 1.0000 | ORAL_TABLET | Freq: Two times a day (BID) | ORAL | 0 refills | Status: DC

## 2023-02-28 NOTE — Progress Notes (Signed)
  Subjective:  Patient ID: Tamara Morgan, female    DOB: 07/02/1955,  MRN: [id number]   DOS: 02/10/2023 Procedure: 1.  FHL tendon transfer to posterior calcaneus, right ankle 2.  Repair of Achilles tendon rupture, right ankle  68 y.o. female seen for post op check.  Patient seen 2.5 weeks s/p above procedures.  She reports she is having a lot of pain in her right posterior heel.  She had to unfortunately missed her appointment last Thursday due to illness.  She subsequently went into the emergency department due to the illness and concern for chest pains.  Turns out she had Tamara Morgan.  She reports she is having much more pain in her right heel than prior.  Review of Systems: Negative except as noted in the HPI. Denies N/V/F/Ch.   Objective:   There were no vitals filed for this visit.  There is no height or weight on file to calculate BMI. Constitutional Well developed. Well nourished.  Vascular Foot warm and well perfused. Capillary refill normal to all digits.   No calf pain with palpation  Neurologic Normal speech. Oriented to person, place, and time. Epicritic sensation intact to toes  Dermatologic Right second toe well-healed with sutures intact. Posterior aspect of the right heel with significant maceration as well as some bleeding.  Small area of central dehiscence noted. Superficial wound breakdown and mild erythema surrounding    Orthopedic: Wiggles toes and sensation intact to all toes.  Absent hallux plantarflexion as expected very sensitive to palpation around the surgical site.  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  PO 2.5 week s/p FHL tendon transfer and right Achilles tendon rupture repair -Now concern for superficial surgical site infection and very small mild central dehiscence. -Will treat with wound care and antibiotics. -Order placed for every other day dressing change to the right foot at her nursing care facility to include silver alginate dressing contact layer followed by 4 x 4 gauze ABD pad and Kerlix roll. -E Rx for Augmentin  875-125 x 10 days.  Patient states she is okay with penicillin -XR: Deferred at this visit   -WB Status: Nonweightbearing in posterior splint to the right lower extremity -Sutures: Remain intact for now -Medications/ABX: As above Augmentin  for 10 days -Will monitor closely now that there is concern for surgical site infection and early dehiscence.  Treat aggressively with wound care and antibiotics if no improvement by next appointment in a week will consider admission for debridement and grafting as patient is severely tender to palpation         Tamara Morgan, DPM [facility] / [facility]

## 2023-03-01 DIAGNOSIS — J309 Allergic rhinitis, unspecified: Secondary | ICD-10-CM | POA: Diagnosis not present

## 2023-03-01 DIAGNOSIS — G4733 Obstructive sleep apnea (adult) (pediatric): Secondary | ICD-10-CM | POA: Diagnosis not present

## 2023-03-01 DIAGNOSIS — I10 Essential (primary) hypertension: Secondary | ICD-10-CM | POA: Diagnosis not present

## 2023-03-01 DIAGNOSIS — I4891 Unspecified atrial fibrillation: Secondary | ICD-10-CM | POA: Diagnosis not present

## 2023-03-03 DIAGNOSIS — I4891 Unspecified atrial fibrillation: Secondary | ICD-10-CM | POA: Diagnosis not present

## 2023-03-03 DIAGNOSIS — J309 Allergic rhinitis, unspecified: Secondary | ICD-10-CM | POA: Diagnosis not present

## 2023-03-03 DIAGNOSIS — I10 Essential (primary) hypertension: Secondary | ICD-10-CM | POA: Diagnosis not present

## 2023-03-03 DIAGNOSIS — G4733 Obstructive sleep apnea (adult) (pediatric): Secondary | ICD-10-CM | POA: Diagnosis not present

## 2023-03-06 DIAGNOSIS — J309 Allergic rhinitis, unspecified: Secondary | ICD-10-CM | POA: Diagnosis not present

## 2023-03-06 DIAGNOSIS — I4891 Unspecified atrial fibrillation: Secondary | ICD-10-CM | POA: Diagnosis not present

## 2023-03-06 DIAGNOSIS — G4733 Obstructive sleep apnea (adult) (pediatric): Secondary | ICD-10-CM | POA: Diagnosis not present

## 2023-03-06 DIAGNOSIS — I10 Essential (primary) hypertension: Secondary | ICD-10-CM | POA: Diagnosis not present

## 2023-03-07 ENCOUNTER — Ambulatory Visit (INDEPENDENT_AMBULATORY_CARE_PROVIDER_SITE_OTHER): Payer: Medicare Other | Admitting: Podiatry

## 2023-03-07 DIAGNOSIS — S42201D Unspecified fracture of upper end of right humerus, subsequent encounter for fracture with routine healing: Secondary | ICD-10-CM

## 2023-03-07 DIAGNOSIS — S86011D Strain of right Achilles tendon, subsequent encounter: Secondary | ICD-10-CM

## 2023-03-07 DIAGNOSIS — I4891 Unspecified atrial fibrillation: Secondary | ICD-10-CM | POA: Diagnosis not present

## 2023-03-07 DIAGNOSIS — G4733 Obstructive sleep apnea (adult) (pediatric): Secondary | ICD-10-CM | POA: Diagnosis not present

## 2023-03-07 DIAGNOSIS — I10 Essential (primary) hypertension: Secondary | ICD-10-CM | POA: Diagnosis not present

## 2023-03-07 DIAGNOSIS — J309 Allergic rhinitis, unspecified: Secondary | ICD-10-CM | POA: Diagnosis not present

## 2023-03-07 DIAGNOSIS — Z9889 Other specified postprocedural states: Secondary | ICD-10-CM

## 2023-03-07 NOTE — Addendum Note (Signed)
Addended by: Carlena Hurl F on: 03/07/2023 03:55 PM   Modules accepted: Orders

## 2023-03-07 NOTE — Progress Notes (Signed)
  Subjective:  Patient ID: Tamara Morgan, female    DOB: Jun 02, 1955,  MRN: [id number]   DOS: 02/10/2023 Procedure: 1.  FHL tendon transfer to posterior calcaneus, right ankle 2.  Repair of Achilles tendon rupture, right ankle  68 y.o. female seen for post op check.  Patient seen 3.5 weeks s/p above procedures.  She reports pain is improved from last apt. Remains NWB in posterior splint to right ankle. Has been taking Augmentin, has 3 days left. Does report diarrhea due to antibiotics use but thinks it was due more to the clindamycin she was previously on.   Review of Systems: Negative except as noted in the HPI. Denies N/V/F/Ch.   Objective:   There were no vitals filed for this visit.  There is no height or weight on file to calculate BMI. Constitutional Well developed. Well nourished.  Vascular Foot warm and well perfused. Capillary refill normal to all digits.   No calf pain with palpation  Neurologic Normal speech. Oriented to person, place, and time. Epicritic sensation intact to toes  Dermatologic Right second toe well-healed with sutures intact. Posterior aspect of the right heel with decreased edema and erythema. Decreased maceration. Still with a small central area of dehiscence centrally. No active drainage.      Orthopedic: Wiggles toes and sensation intact to all toes.  Absent hallux plantarflexion as expected very sensitive to palpation around the surgical site.  Edema noted slightly increased from prior   Radiographs: Deferred today  Pathology: N/A  Micro: N/A  Assessment:   Right Achilles tendon rupture status post prior retrocalcaneal exostectomy with secondary repair of Achilles now status post revision tendon repair and FHL tendon transfer  Plan:  Patient was evaluated and treated and all questions answered.  PO 3.5 week s/p FHL tendon transfer and right Achilles tendon rupture repair - Small central area of dehiscence appears to be improving with  local wound care.  -Will continue to treat with wound care and antibiotics. -Order placed for every other day dressing change to the right foot at her nursing care facility to include silver alginate dressing contact layer followed by 4 x 4 gauze ABD pad and Kerlix roll. -Finsh course of augmentin then monitor off abx. -XR: Deferred at this visit   -WB Status: Nonweightbearing in posterior splint to the right lower extremity - will plan to go to CAM boot at next apt -Sutures: Remain intact for now -Medications/ABX: Finish augmentin -Will monitor closely, though appears to be improving with regards to wound complication to right heel         Corinna Gab, DPM [facility] / [facility]

## 2023-03-08 DIAGNOSIS — I4891 Unspecified atrial fibrillation: Secondary | ICD-10-CM | POA: Diagnosis not present

## 2023-03-08 DIAGNOSIS — J309 Allergic rhinitis, unspecified: Secondary | ICD-10-CM | POA: Diagnosis not present

## 2023-03-08 DIAGNOSIS — I10 Essential (primary) hypertension: Secondary | ICD-10-CM | POA: Diagnosis not present

## 2023-03-08 DIAGNOSIS — G4733 Obstructive sleep apnea (adult) (pediatric): Secondary | ICD-10-CM | POA: Diagnosis not present

## 2023-03-09 ENCOUNTER — Ambulatory Visit: Payer: Medicare Other | Admitting: Orthopaedic Surgery

## 2023-03-09 ENCOUNTER — Other Ambulatory Visit (INDEPENDENT_AMBULATORY_CARE_PROVIDER_SITE_OTHER): Payer: Self-pay

## 2023-03-09 DIAGNOSIS — M898X2 Other specified disorders of bone, upper arm: Secondary | ICD-10-CM

## 2023-03-09 DIAGNOSIS — J309 Allergic rhinitis, unspecified: Secondary | ICD-10-CM | POA: Diagnosis not present

## 2023-03-09 DIAGNOSIS — I4891 Unspecified atrial fibrillation: Secondary | ICD-10-CM | POA: Diagnosis not present

## 2023-03-09 DIAGNOSIS — G4733 Obstructive sleep apnea (adult) (pediatric): Secondary | ICD-10-CM | POA: Diagnosis not present

## 2023-03-09 DIAGNOSIS — I10 Essential (primary) hypertension: Secondary | ICD-10-CM | POA: Diagnosis not present

## 2023-03-09 NOTE — Progress Notes (Signed)
Office Visit Note   Patient: Tamara Morgan           Date of Birth: 1955-06-17           MRN: [id number] Visit Date: 03/09/2023              Requested by: Jackelyn Poling, DO [address] [place],  [place] [address] PCP: Jackelyn Poling, DO   Assessment & Plan: Visit Diagnoses:  1. Pain of right humerus     Plan: Tamara Morgan is a 68 year old female with right shoulder injury from December 27.  I am not convinced that she had a true fracture.  Could be an injury to the rotator cuff instead.  Nevertheless at this point I would like her to start physical therapy for cuff strengthening for 6 weeks.  I can see her back as needed.  Follow-Up Instructions: No follow-ups on file.   Orders:  Orders Placed This Encounter  Procedures   XR Humerus Right   No orders of the defined types were placed in this encounter.     Procedures: No procedures performed   Clinical Data: No additional findings.   Subjective: Chief Complaint  Patient presents with   Right Shoulder - Pain, Fracture    HPI Tamara Morgan is a very pleasant 68 year old female who originally suffered a fall on 02/09/2024.  She is currently in [facility] rehab.  She also suffered an Achilles rupture that was operatively treated by one of the podiatrist.  She also injured her shoulder and initial x-rays were concerning for a nondisplaced greater tuberosity fracture.  She has been in a shoulder sling since then.  Review of Systems  Constitutional: Negative.   HENT: Negative.    Eyes: Negative.   Respiratory: Negative.    Cardiovascular: Negative.   Endocrine: Negative.   Musculoskeletal: Negative.   Neurological: Negative.   Hematological: Negative.   Psychiatric/Behavioral: Negative.    All other systems reviewed and are negative.    Objective: Vital Signs: There were no vitals taken for this visit.  Physical Exam Vitals and nursing note reviewed.  Constitutional:      Appearance: She is well-developed.   HENT:     Head: Atraumatic.     Nose: Nose normal.  Eyes:     Extraocular Movements: Extraocular movements intact.  Cardiovascular:     Pulses: Normal pulses.  Pulmonary:     Effort: Pulmonary effort is normal.  Abdominal:     Palpations: Abdomen is soft.  Musculoskeletal:     Cervical back: Neck supple.  Skin:    General: Skin is warm.     Capillary Refill: Capillary refill takes less than 2 seconds.  Neurological:     Mental Status: She is alert. Mental status is at baseline.  Psychiatric:        Behavior: Behavior normal.        Thought Content: Thought content normal.        Judgment: Judgment normal.     Ortho Exam Examination of the right shoulder shows no tenderness.  Passive range of motion slightly painful but I can get her to full range of motion.  Slight weakness with manual muscle testing of the supraspinatus and infraspinatus. Specialty Comments:  No specialty comments available.  Imaging: XR Humerus Right Result Date: 03/09/2023 X-rays of the right humerus show no acute or structural abnormalities    PMFS History: Patient Active Problem List   Diagnosis Date Noted   Achilles tendon rupture, right, subsequent encounter 02/12/2023  Achilles tendon rupture 02/11/2023   S/P Achilles tendon repair 02/10/2023   Atrial fibrillation (HCC) 02/10/2023   Humerus fracture 02/10/2023   Personal history of venous thrombosis and embolism 09/08/2022   Abnormal SPEP 09/08/2022   OSA (obstructive sleep apnea) 06/17/2022   Hashimoto's thyroiditis 06/17/2022   History of gastroesophageal reflux (GERD) 06/17/2022   Varicose veins of bilateral lower extremities with other complications 06/17/2022   Supraventricular tachycardia (HCC) 06/17/2022   Primary hypertension 06/17/2022   Past Medical History:  Diagnosis Date   Arrhythmia    Benign tumor    Cardiac anomaly    GERD (gastroesophageal reflux disease)    Hashimoto's disease    History of bone density study     Hyperlipidemia    Hypertension    Lyme disease    Malaria    OSA (obstructive sleep apnea)    Pap smear, as part of routine gynecological examination 2022    Family History  Problem Relation Age of Onset   Hypertension Mother    Hypothyroidism Mother    Heart Problems Mother        Heart stent   Dementia Mother 84   Arthritis Mother    Arthritis Father    Cancer Father        TTR   Hypertension Father    Diabetes Father        type 2   Hypertension Daughter    Appendicitis Daughter    Hypertension Son     Past Surgical History:  Procedure Laterality Date   ABDOMINAL HYSTERECTOMY     ACHILLES TENDON SURGERY Right 02/10/2023   Procedure: ACHILLES TENDON REPAIR;  Surgeon: Pilar Plate, DPM;  Location: [facility];  Service: Orthopedics/Podiatry;  Laterality: Right;   CARDIAC CATHETERIZATION     COLONOSCOPY     several   ECTOPIC PREGNANCY SURGERY     OOPHORECTOMY Left    RECTOCELE REPAIR     TENDON TRANSFER Right 02/10/2023   Procedure: RIGHT FOOT TENDON TRANSFER WITH TENDON GRAFT APPLICATION;  Surgeon: Pilar Plate, DPM;  Location: [facility];  Service: Orthopedics/Podiatry;  Laterality: Right;  BLOCK   VAGINAL HYSTERECTOMY     Social History   Occupational History   Not on file  Tobacco Use   Smoking status: Never    Passive exposure: Past   Smokeless tobacco: Never  Vaping Use   Vaping status: Never Used  Substance and Sexual Activity   Alcohol use: Yes    Alcohol/week: 1.0 - 2.0 standard drink of alcohol    Types: 1 - 2 Glasses of wine per week    Comment: occ   Drug use: Never   Sexual activity: Not on file

## 2023-03-10 DIAGNOSIS — J309 Allergic rhinitis, unspecified: Secondary | ICD-10-CM | POA: Diagnosis not present

## 2023-03-10 DIAGNOSIS — I4891 Unspecified atrial fibrillation: Secondary | ICD-10-CM | POA: Diagnosis not present

## 2023-03-10 DIAGNOSIS — G4733 Obstructive sleep apnea (adult) (pediatric): Secondary | ICD-10-CM | POA: Diagnosis not present

## 2023-03-10 DIAGNOSIS — I10 Essential (primary) hypertension: Secondary | ICD-10-CM | POA: Diagnosis not present

## 2023-03-13 DIAGNOSIS — Z9889 Other specified postprocedural states: Secondary | ICD-10-CM | POA: Diagnosis not present

## 2023-03-13 DIAGNOSIS — S42309D Unspecified fracture of shaft of humerus, unspecified arm, subsequent encounter for fracture with routine healing: Secondary | ICD-10-CM | POA: Diagnosis not present

## 2023-03-13 DIAGNOSIS — M66871 Spontaneous rupture of other tendons, right ankle and foot: Secondary | ICD-10-CM | POA: Diagnosis not present

## 2023-03-13 DIAGNOSIS — S86019D Strain of unspecified Achilles tendon, subsequent encounter: Secondary | ICD-10-CM | POA: Diagnosis not present

## 2023-03-14 DIAGNOSIS — M66871 Spontaneous rupture of other tendons, right ankle and foot: Secondary | ICD-10-CM | POA: Diagnosis not present

## 2023-03-14 DIAGNOSIS — S86019D Strain of unspecified Achilles tendon, subsequent encounter: Secondary | ICD-10-CM | POA: Diagnosis not present

## 2023-03-14 DIAGNOSIS — Z9889 Other specified postprocedural states: Secondary | ICD-10-CM | POA: Diagnosis not present

## 2023-03-14 DIAGNOSIS — S42309D Unspecified fracture of shaft of humerus, unspecified arm, subsequent encounter for fracture with routine healing: Secondary | ICD-10-CM | POA: Diagnosis not present

## 2023-03-15 DIAGNOSIS — M66871 Spontaneous rupture of other tendons, right ankle and foot: Secondary | ICD-10-CM | POA: Diagnosis not present

## 2023-03-15 DIAGNOSIS — S86019D Strain of unspecified Achilles tendon, subsequent encounter: Secondary | ICD-10-CM | POA: Diagnosis not present

## 2023-03-15 DIAGNOSIS — Z9889 Other specified postprocedural states: Secondary | ICD-10-CM | POA: Diagnosis not present

## 2023-03-15 DIAGNOSIS — S42309D Unspecified fracture of shaft of humerus, unspecified arm, subsequent encounter for fracture with routine healing: Secondary | ICD-10-CM | POA: Diagnosis not present

## 2023-03-16 ENCOUNTER — Ambulatory Visit (INDEPENDENT_AMBULATORY_CARE_PROVIDER_SITE_OTHER): Payer: Medicare Other | Admitting: Podiatry

## 2023-03-16 DIAGNOSIS — S86019D Strain of unspecified Achilles tendon, subsequent encounter: Secondary | ICD-10-CM | POA: Diagnosis not present

## 2023-03-16 DIAGNOSIS — Z9889 Other specified postprocedural states: Secondary | ICD-10-CM

## 2023-03-16 DIAGNOSIS — M66871 Spontaneous rupture of other tendons, right ankle and foot: Secondary | ICD-10-CM | POA: Diagnosis not present

## 2023-03-16 DIAGNOSIS — S42309D Unspecified fracture of shaft of humerus, unspecified arm, subsequent encounter for fracture with routine healing: Secondary | ICD-10-CM | POA: Diagnosis not present

## 2023-03-16 DIAGNOSIS — S86011D Strain of right Achilles tendon, subsequent encounter: Secondary | ICD-10-CM

## 2023-03-16 NOTE — Progress Notes (Signed)
  Subjective:  Patient ID: Tamara Morgan, female    DOB: December 05, 1955,  MRN: [id number]   DOS: 02/10/2023 Procedure: 1.  FHL tendon transfer to posterior calcaneus, right ankle 2.  Repair of Achilles tendon rupture, right ankle  68 y.o. female seen for post op check.  Patient seen 4 weeks s/p above procedures.  She reports pain is improved from last apt. Remains NWB in posterior splint to right ankle.  Currently off antibiotics and has been compliant with nonweightbearing.  She is about to leave nursing facility tomorrow.  Excited to go home.  Has been keeping the surgical site dry as instructed.  Did have to redress the dressing herself once.  Review of Systems: Negative except as noted in the HPI. Denies N/V/F/Ch.   Objective:   There were no vitals filed for this visit.  There is no height or weight on file to calculate BMI. Constitutional Well developed. Well nourished.  Vascular Foot warm and well perfused. Capillary refill normal to all digits.   No calf pain with palpation  Neurologic Normal speech. Oriented to person, place, and time. Epicritic sensation intact to toes  Dermatologic Right second toe well-healed with sutures intact. Posterior aspect of the right heel with decreased edema and erythema.  Overall much improved from prior there is eschar overlying areas of prior ulceration now are healed in some eschar present distally    Orthopedic: Wiggles toes and sensation intact to all toes.  Absent hallux plantarflexion as expected.  Does have some ankle dorsiflexion range of motion as well as plantarflexion however this is limited due to pain and edema and foot is resting in a plantarflexed position due to tension of the repair.   Radiographs: Deferred today  Pathology: N/A  Micro: N/A  Assessment:   Right Achilles tendon rupture status post prior retrocalcaneal exostectomy with secondary repair of Achilles now status post revision tendon repair and FHL tendon  transfer  Plan:  Patient was evaluated and treated and all questions answered.  PO 4 week s/p FHL tendon transfer and right Achilles tendon rupture repair -Overall less concern for dehiscence at this time overall incision is now healing with ongoing local wound care -Will continue to treat with wound care -Continue dressing changes with calcium alginate silver dressing 4 x 4 Kerlix and Ace wrap. -Patient is status post course of Augmentin we will continue off antibiotics at this time -XR: Deferred at this visit   -WB Status: Nonweightbearing in cam boot.  Patient has cam boot.  I also placed 2 large heel lifts in the cam boot for her to have to prevent excess tension on the repair site -Sutures: Sutures were removed in total at this visit -Medications/ABX: Currently no indication for antibiotics -Discussed with patient she is now okay to wash the surgical site with warm soapy water but then should dry thoroughly and apply a new dressing. -Remain nonweightbearing 1 more week and then begin putting some weight down in the cam boot with heel lifts in place.  Gradually increase in the amount of weight she is putting down for the following 3 weeks until she comes back to see me in a month         Corinna Gab, DPM [facility] / [facility]

## 2023-03-17 DIAGNOSIS — M66871 Spontaneous rupture of other tendons, right ankle and foot: Secondary | ICD-10-CM | POA: Diagnosis not present

## 2023-03-17 DIAGNOSIS — S86019D Strain of unspecified Achilles tendon, subsequent encounter: Secondary | ICD-10-CM | POA: Diagnosis not present

## 2023-03-17 DIAGNOSIS — S42309D Unspecified fracture of shaft of humerus, unspecified arm, subsequent encounter for fracture with routine healing: Secondary | ICD-10-CM | POA: Diagnosis not present

## 2023-03-17 DIAGNOSIS — Z9889 Other specified postprocedural states: Secondary | ICD-10-CM | POA: Diagnosis not present

## 2023-03-19 DIAGNOSIS — S42254D Nondisplaced fracture of greater tuberosity of right humerus, subsequent encounter for fracture with routine healing: Secondary | ICD-10-CM | POA: Diagnosis not present

## 2023-03-19 DIAGNOSIS — S42301D Unspecified fracture of shaft of humerus, right arm, subsequent encounter for fracture with routine healing: Secondary | ICD-10-CM | POA: Diagnosis not present

## 2023-03-19 DIAGNOSIS — I10 Essential (primary) hypertension: Secondary | ICD-10-CM | POA: Diagnosis not present

## 2023-03-19 DIAGNOSIS — S86011D Strain of right Achilles tendon, subsequent encounter: Secondary | ICD-10-CM | POA: Diagnosis not present

## 2023-03-21 DIAGNOSIS — S86011D Strain of right Achilles tendon, subsequent encounter: Secondary | ICD-10-CM | POA: Diagnosis not present

## 2023-03-24 DIAGNOSIS — M7041 Prepatellar bursitis, right knee: Secondary | ICD-10-CM | POA: Diagnosis not present

## 2023-03-24 DIAGNOSIS — M7042 Prepatellar bursitis, left knee: Secondary | ICD-10-CM | POA: Diagnosis not present

## 2023-03-24 DIAGNOSIS — M1711 Unilateral primary osteoarthritis, right knee: Secondary | ICD-10-CM | POA: Diagnosis not present

## 2023-03-25 DIAGNOSIS — Z9181 History of falling: Secondary | ICD-10-CM | POA: Diagnosis not present

## 2023-03-25 DIAGNOSIS — R739 Hyperglycemia, unspecified: Secondary | ICD-10-CM | POA: Diagnosis not present

## 2023-03-25 DIAGNOSIS — S42301D Unspecified fracture of shaft of humerus, right arm, subsequent encounter for fracture with routine healing: Secondary | ICD-10-CM | POA: Diagnosis not present

## 2023-03-25 DIAGNOSIS — Z7901 Long term (current) use of anticoagulants: Secondary | ICD-10-CM | POA: Diagnosis not present

## 2023-03-25 DIAGNOSIS — G4733 Obstructive sleep apnea (adult) (pediatric): Secondary | ICD-10-CM | POA: Diagnosis not present

## 2023-03-25 DIAGNOSIS — M1990 Unspecified osteoarthritis, unspecified site: Secondary | ICD-10-CM | POA: Diagnosis not present

## 2023-03-25 DIAGNOSIS — M25572 Pain in left ankle and joints of left foot: Secondary | ICD-10-CM | POA: Diagnosis not present

## 2023-03-25 DIAGNOSIS — W19XXXD Unspecified fall, subsequent encounter: Secondary | ICD-10-CM | POA: Diagnosis not present

## 2023-03-25 DIAGNOSIS — E785 Hyperlipidemia, unspecified: Secondary | ICD-10-CM | POA: Diagnosis not present

## 2023-03-25 DIAGNOSIS — M66879 Spontaneous rupture of other tendons, unspecified ankle and foot: Secondary | ICD-10-CM | POA: Diagnosis not present

## 2023-03-25 DIAGNOSIS — Z993 Dependence on wheelchair: Secondary | ICD-10-CM | POA: Diagnosis not present

## 2023-03-25 DIAGNOSIS — M25562 Pain in left knee: Secondary | ICD-10-CM | POA: Diagnosis not present

## 2023-03-25 DIAGNOSIS — R32 Unspecified urinary incontinence: Secondary | ICD-10-CM | POA: Diagnosis not present

## 2023-03-25 DIAGNOSIS — A6920 Lyme disease, unspecified: Secondary | ICD-10-CM | POA: Diagnosis not present

## 2023-03-25 DIAGNOSIS — I471 Supraventricular tachycardia, unspecified: Secondary | ICD-10-CM | POA: Diagnosis not present

## 2023-03-25 DIAGNOSIS — S86001D Unspecified injury of right Achilles tendon, subsequent encounter: Secondary | ICD-10-CM | POA: Diagnosis not present

## 2023-03-25 DIAGNOSIS — E063 Autoimmune thyroiditis: Secondary | ICD-10-CM | POA: Diagnosis not present

## 2023-03-25 DIAGNOSIS — K219 Gastro-esophageal reflux disease without esophagitis: Secondary | ICD-10-CM | POA: Diagnosis not present

## 2023-03-25 DIAGNOSIS — I4891 Unspecified atrial fibrillation: Secondary | ICD-10-CM | POA: Diagnosis not present

## 2023-03-25 DIAGNOSIS — Z86718 Personal history of other venous thrombosis and embolism: Secondary | ICD-10-CM | POA: Diagnosis not present

## 2023-03-25 DIAGNOSIS — J309 Allergic rhinitis, unspecified: Secondary | ICD-10-CM | POA: Diagnosis not present

## 2023-03-25 DIAGNOSIS — S42254D Nondisplaced fracture of greater tuberosity of right humerus, subsequent encounter for fracture with routine healing: Secondary | ICD-10-CM | POA: Diagnosis not present

## 2023-03-25 DIAGNOSIS — S86011D Strain of right Achilles tendon, subsequent encounter: Secondary | ICD-10-CM | POA: Diagnosis not present

## 2023-03-25 DIAGNOSIS — I10 Essential (primary) hypertension: Secondary | ICD-10-CM | POA: Diagnosis not present

## 2023-03-28 DIAGNOSIS — S42301D Unspecified fracture of shaft of humerus, right arm, subsequent encounter for fracture with routine healing: Secondary | ICD-10-CM | POA: Diagnosis not present

## 2023-03-28 DIAGNOSIS — M1990 Unspecified osteoarthritis, unspecified site: Secondary | ICD-10-CM | POA: Diagnosis not present

## 2023-03-28 DIAGNOSIS — R739 Hyperglycemia, unspecified: Secondary | ICD-10-CM | POA: Diagnosis not present

## 2023-03-28 DIAGNOSIS — M66879 Spontaneous rupture of other tendons, unspecified ankle and foot: Secondary | ICD-10-CM | POA: Diagnosis not present

## 2023-03-28 DIAGNOSIS — A6920 Lyme disease, unspecified: Secondary | ICD-10-CM | POA: Diagnosis not present

## 2023-03-28 DIAGNOSIS — I471 Supraventricular tachycardia, unspecified: Secondary | ICD-10-CM | POA: Diagnosis not present

## 2023-03-28 DIAGNOSIS — E063 Autoimmune thyroiditis: Secondary | ICD-10-CM | POA: Diagnosis not present

## 2023-03-28 DIAGNOSIS — I10 Essential (primary) hypertension: Secondary | ICD-10-CM | POA: Diagnosis not present

## 2023-03-28 DIAGNOSIS — E785 Hyperlipidemia, unspecified: Secondary | ICD-10-CM | POA: Diagnosis not present

## 2023-03-28 DIAGNOSIS — M25562 Pain in left knee: Secondary | ICD-10-CM | POA: Diagnosis not present

## 2023-03-28 DIAGNOSIS — S42254D Nondisplaced fracture of greater tuberosity of right humerus, subsequent encounter for fracture with routine healing: Secondary | ICD-10-CM | POA: Diagnosis not present

## 2023-03-28 DIAGNOSIS — G4733 Obstructive sleep apnea (adult) (pediatric): Secondary | ICD-10-CM | POA: Diagnosis not present

## 2023-03-28 DIAGNOSIS — M25572 Pain in left ankle and joints of left foot: Secondary | ICD-10-CM | POA: Diagnosis not present

## 2023-03-28 DIAGNOSIS — I4891 Unspecified atrial fibrillation: Secondary | ICD-10-CM | POA: Diagnosis not present

## 2023-03-28 DIAGNOSIS — K219 Gastro-esophageal reflux disease without esophagitis: Secondary | ICD-10-CM | POA: Diagnosis not present

## 2023-03-28 DIAGNOSIS — S86011D Strain of right Achilles tendon, subsequent encounter: Secondary | ICD-10-CM | POA: Diagnosis not present

## 2023-03-30 DIAGNOSIS — M1990 Unspecified osteoarthritis, unspecified site: Secondary | ICD-10-CM | POA: Diagnosis not present

## 2023-03-30 DIAGNOSIS — S42301D Unspecified fracture of shaft of humerus, right arm, subsequent encounter for fracture with routine healing: Secondary | ICD-10-CM | POA: Diagnosis not present

## 2023-03-30 DIAGNOSIS — E785 Hyperlipidemia, unspecified: Secondary | ICD-10-CM | POA: Diagnosis not present

## 2023-03-30 DIAGNOSIS — I4891 Unspecified atrial fibrillation: Secondary | ICD-10-CM | POA: Diagnosis not present

## 2023-03-30 DIAGNOSIS — G4733 Obstructive sleep apnea (adult) (pediatric): Secondary | ICD-10-CM | POA: Diagnosis not present

## 2023-03-30 DIAGNOSIS — M66879 Spontaneous rupture of other tendons, unspecified ankle and foot: Secondary | ICD-10-CM | POA: Diagnosis not present

## 2023-03-30 DIAGNOSIS — E063 Autoimmune thyroiditis: Secondary | ICD-10-CM | POA: Diagnosis not present

## 2023-03-30 DIAGNOSIS — K219 Gastro-esophageal reflux disease without esophagitis: Secondary | ICD-10-CM | POA: Diagnosis not present

## 2023-03-30 DIAGNOSIS — M25572 Pain in left ankle and joints of left foot: Secondary | ICD-10-CM | POA: Diagnosis not present

## 2023-03-30 DIAGNOSIS — I10 Essential (primary) hypertension: Secondary | ICD-10-CM | POA: Diagnosis not present

## 2023-03-30 DIAGNOSIS — M25562 Pain in left knee: Secondary | ICD-10-CM | POA: Diagnosis not present

## 2023-03-30 DIAGNOSIS — S86011D Strain of right Achilles tendon, subsequent encounter: Secondary | ICD-10-CM | POA: Diagnosis not present

## 2023-03-30 DIAGNOSIS — A6920 Lyme disease, unspecified: Secondary | ICD-10-CM | POA: Diagnosis not present

## 2023-03-30 DIAGNOSIS — I471 Supraventricular tachycardia, unspecified: Secondary | ICD-10-CM | POA: Diagnosis not present

## 2023-03-30 DIAGNOSIS — R739 Hyperglycemia, unspecified: Secondary | ICD-10-CM | POA: Diagnosis not present

## 2023-03-30 DIAGNOSIS — S42254D Nondisplaced fracture of greater tuberosity of right humerus, subsequent encounter for fracture with routine healing: Secondary | ICD-10-CM | POA: Diagnosis not present

## 2023-04-01 DIAGNOSIS — A6920 Lyme disease, unspecified: Secondary | ICD-10-CM | POA: Diagnosis not present

## 2023-04-01 DIAGNOSIS — M66879 Spontaneous rupture of other tendons, unspecified ankle and foot: Secondary | ICD-10-CM | POA: Diagnosis not present

## 2023-04-01 DIAGNOSIS — E063 Autoimmune thyroiditis: Secondary | ICD-10-CM | POA: Diagnosis not present

## 2023-04-01 DIAGNOSIS — R739 Hyperglycemia, unspecified: Secondary | ICD-10-CM | POA: Diagnosis not present

## 2023-04-01 DIAGNOSIS — G4733 Obstructive sleep apnea (adult) (pediatric): Secondary | ICD-10-CM | POA: Diagnosis not present

## 2023-04-01 DIAGNOSIS — I471 Supraventricular tachycardia, unspecified: Secondary | ICD-10-CM | POA: Diagnosis not present

## 2023-04-01 DIAGNOSIS — I4891 Unspecified atrial fibrillation: Secondary | ICD-10-CM | POA: Diagnosis not present

## 2023-04-01 DIAGNOSIS — S42254D Nondisplaced fracture of greater tuberosity of right humerus, subsequent encounter for fracture with routine healing: Secondary | ICD-10-CM | POA: Diagnosis not present

## 2023-04-01 DIAGNOSIS — M25562 Pain in left knee: Secondary | ICD-10-CM | POA: Diagnosis not present

## 2023-04-01 DIAGNOSIS — K219 Gastro-esophageal reflux disease without esophagitis: Secondary | ICD-10-CM | POA: Diagnosis not present

## 2023-04-01 DIAGNOSIS — S86011D Strain of right Achilles tendon, subsequent encounter: Secondary | ICD-10-CM | POA: Diagnosis not present

## 2023-04-01 DIAGNOSIS — M1990 Unspecified osteoarthritis, unspecified site: Secondary | ICD-10-CM | POA: Diagnosis not present

## 2023-04-01 DIAGNOSIS — S42301D Unspecified fracture of shaft of humerus, right arm, subsequent encounter for fracture with routine healing: Secondary | ICD-10-CM | POA: Diagnosis not present

## 2023-04-01 DIAGNOSIS — E785 Hyperlipidemia, unspecified: Secondary | ICD-10-CM | POA: Diagnosis not present

## 2023-04-01 DIAGNOSIS — M25572 Pain in left ankle and joints of left foot: Secondary | ICD-10-CM | POA: Diagnosis not present

## 2023-04-01 DIAGNOSIS — I10 Essential (primary) hypertension: Secondary | ICD-10-CM | POA: Diagnosis not present

## 2023-04-03 DIAGNOSIS — H903 Sensorineural hearing loss, bilateral: Secondary | ICD-10-CM | POA: Diagnosis not present

## 2023-04-03 DIAGNOSIS — H9313 Tinnitus, bilateral: Secondary | ICD-10-CM | POA: Diagnosis not present

## 2023-04-03 DIAGNOSIS — Z0110 Encounter for examination of ears and hearing without abnormal findings: Secondary | ICD-10-CM | POA: Diagnosis not present

## 2023-04-06 ENCOUNTER — Ambulatory Visit: Payer: Medicare Other | Admitting: Podiatry

## 2023-04-06 ENCOUNTER — Ambulatory Visit (INDEPENDENT_AMBULATORY_CARE_PROVIDER_SITE_OTHER): Payer: Medicare Other

## 2023-04-06 ENCOUNTER — Encounter: Payer: Self-pay | Admitting: Podiatry

## 2023-04-06 VITALS — Ht 67.0 in | Wt 250.0 lb

## 2023-04-06 DIAGNOSIS — S86011D Strain of right Achilles tendon, subsequent encounter: Secondary | ICD-10-CM

## 2023-04-06 NOTE — Progress Notes (Signed)
  Subjective:  Patient ID: Tamara Morgan, female    DOB: 11-14-1955,  MRN: [id number]   DOS: 02/10/2023 Procedure: 1.  FHL tendon transfer to posterior calcaneus, right ankle 2.  Repair of Achilles tendon rupture, right ankle  68 y.o. female seen for post op check.  Patient seen 8 weeks s/p above procedures.   She reports she is doing better at this time having less pain and feels the wound is improving.  She has remained nonweightbearing with aid of knee scooter as instructed.  Has a cam boot with heel lifts.   Review of Systems: Negative except as noted in the HPI. Denies N/V/F/Ch.   Objective:   There were no vitals filed for this visit.  Body mass index is 39.16 kg/m. Constitutional Well developed. Well nourished.  Vascular Foot warm and well perfused. Capillary refill normal to all digits.   No calf pain with palpation  Neurologic Normal speech. Oriented to person, place, and time. Epicritic sensation intact to toes  Dermatologic Right second toe well-healed with sutures intact. Posterior aspect of the right heel improving nearly fully healed at this time there is eschar overlying.  Prior wound which upon debridement shows evidence of full healing underneath.  No drainage erythema or probing wound.    Orthopedic: Wiggles toes and sensation intact to all toes.  Absent hallux plantarflexion as expected.  Does have some ankle dorsiflexion range of motion as well as plantarflexion however this is limited due to pain and edema and foot is resting in a plantarflexed position due to tension of the repair.   Radiographs: 04/06/2023 XR 2 views right ankle AP and lateral nonweightbearing.  Findings: Evidence of prior resection posterior superior aspect of calcaneus.  Pathology: N/A  Micro: N/A  Assessment:   Right Achilles tendon rupture status post prior retrocalcaneal exostectomy with secondary repair of Achilles now status post revision tendon repair and FHL tendon  transfer  Plan:  Patient was evaluated and treated and all questions answered.  PO 8 week s/p FHL tendon transfer and right Achilles tendon rupture repair -Overall improving wound is nearly fully healed.  Making progress in regards to pain level and edema -Patient is status post course of Augmentin we will continue off antibiotics at this time -XR: Expected postoperative changes -WB Status: Begin weightbearing as tolerated in a cam boot with heel wedges until next appointment. -Medications/ABX: Currently no indication for antibiotics -Continue to monitor closely use walker to assist ambulation as needed but she can proceed with some weightbearing. -Continue physical therapy at home okay for range of motion exercises to continue but want physical therapy to remain nonweightbearing at this time.         Corinna Gab, DPM [facility] / [facility]

## 2023-04-07 DIAGNOSIS — H9313 Tinnitus, bilateral: Secondary | ICD-10-CM | POA: Diagnosis not present

## 2023-04-07 DIAGNOSIS — M26622 Arthralgia of left temporomandibular joint: Secondary | ICD-10-CM | POA: Diagnosis not present

## 2023-04-07 DIAGNOSIS — H903 Sensorineural hearing loss, bilateral: Secondary | ICD-10-CM | POA: Diagnosis not present

## 2023-04-10 DIAGNOSIS — M1990 Unspecified osteoarthritis, unspecified site: Secondary | ICD-10-CM | POA: Diagnosis not present

## 2023-04-10 DIAGNOSIS — E063 Autoimmune thyroiditis: Secondary | ICD-10-CM | POA: Diagnosis not present

## 2023-04-10 DIAGNOSIS — A6920 Lyme disease, unspecified: Secondary | ICD-10-CM | POA: Diagnosis not present

## 2023-04-10 DIAGNOSIS — M25572 Pain in left ankle and joints of left foot: Secondary | ICD-10-CM | POA: Diagnosis not present

## 2023-04-10 DIAGNOSIS — K219 Gastro-esophageal reflux disease without esophagitis: Secondary | ICD-10-CM | POA: Diagnosis not present

## 2023-04-10 DIAGNOSIS — I4891 Unspecified atrial fibrillation: Secondary | ICD-10-CM | POA: Diagnosis not present

## 2023-04-10 DIAGNOSIS — G4733 Obstructive sleep apnea (adult) (pediatric): Secondary | ICD-10-CM | POA: Diagnosis not present

## 2023-04-10 DIAGNOSIS — S42301D Unspecified fracture of shaft of humerus, right arm, subsequent encounter for fracture with routine healing: Secondary | ICD-10-CM | POA: Diagnosis not present

## 2023-04-10 DIAGNOSIS — R739 Hyperglycemia, unspecified: Secondary | ICD-10-CM | POA: Diagnosis not present

## 2023-04-10 DIAGNOSIS — M66879 Spontaneous rupture of other tendons, unspecified ankle and foot: Secondary | ICD-10-CM | POA: Diagnosis not present

## 2023-04-10 DIAGNOSIS — S86011D Strain of right Achilles tendon, subsequent encounter: Secondary | ICD-10-CM | POA: Diagnosis not present

## 2023-04-10 DIAGNOSIS — I471 Supraventricular tachycardia, unspecified: Secondary | ICD-10-CM | POA: Diagnosis not present

## 2023-04-10 DIAGNOSIS — I10 Essential (primary) hypertension: Secondary | ICD-10-CM | POA: Diagnosis not present

## 2023-04-10 DIAGNOSIS — S42254D Nondisplaced fracture of greater tuberosity of right humerus, subsequent encounter for fracture with routine healing: Secondary | ICD-10-CM | POA: Diagnosis not present

## 2023-04-10 DIAGNOSIS — E785 Hyperlipidemia, unspecified: Secondary | ICD-10-CM | POA: Diagnosis not present

## 2023-04-10 DIAGNOSIS — M25562 Pain in left knee: Secondary | ICD-10-CM | POA: Diagnosis not present

## 2023-04-12 DIAGNOSIS — M25562 Pain in left knee: Secondary | ICD-10-CM | POA: Diagnosis not present

## 2023-04-12 DIAGNOSIS — S42301D Unspecified fracture of shaft of humerus, right arm, subsequent encounter for fracture with routine healing: Secondary | ICD-10-CM | POA: Diagnosis not present

## 2023-04-12 DIAGNOSIS — K219 Gastro-esophageal reflux disease without esophagitis: Secondary | ICD-10-CM | POA: Diagnosis not present

## 2023-04-12 DIAGNOSIS — G4733 Obstructive sleep apnea (adult) (pediatric): Secondary | ICD-10-CM | POA: Diagnosis not present

## 2023-04-12 DIAGNOSIS — R739 Hyperglycemia, unspecified: Secondary | ICD-10-CM | POA: Diagnosis not present

## 2023-04-12 DIAGNOSIS — M25572 Pain in left ankle and joints of left foot: Secondary | ICD-10-CM | POA: Diagnosis not present

## 2023-04-12 DIAGNOSIS — I471 Supraventricular tachycardia, unspecified: Secondary | ICD-10-CM | POA: Diagnosis not present

## 2023-04-12 DIAGNOSIS — A6920 Lyme disease, unspecified: Secondary | ICD-10-CM | POA: Diagnosis not present

## 2023-04-12 DIAGNOSIS — E063 Autoimmune thyroiditis: Secondary | ICD-10-CM | POA: Diagnosis not present

## 2023-04-12 DIAGNOSIS — S86011D Strain of right Achilles tendon, subsequent encounter: Secondary | ICD-10-CM | POA: Diagnosis not present

## 2023-04-12 DIAGNOSIS — I4891 Unspecified atrial fibrillation: Secondary | ICD-10-CM | POA: Diagnosis not present

## 2023-04-12 DIAGNOSIS — S42254D Nondisplaced fracture of greater tuberosity of right humerus, subsequent encounter for fracture with routine healing: Secondary | ICD-10-CM | POA: Diagnosis not present

## 2023-04-12 DIAGNOSIS — M1990 Unspecified osteoarthritis, unspecified site: Secondary | ICD-10-CM | POA: Diagnosis not present

## 2023-04-12 DIAGNOSIS — I10 Essential (primary) hypertension: Secondary | ICD-10-CM | POA: Diagnosis not present

## 2023-04-12 DIAGNOSIS — M66879 Spontaneous rupture of other tendons, unspecified ankle and foot: Secondary | ICD-10-CM | POA: Diagnosis not present

## 2023-04-12 DIAGNOSIS — E785 Hyperlipidemia, unspecified: Secondary | ICD-10-CM | POA: Diagnosis not present

## 2023-04-13 DIAGNOSIS — I4891 Unspecified atrial fibrillation: Secondary | ICD-10-CM | POA: Diagnosis not present

## 2023-04-13 DIAGNOSIS — S42301D Unspecified fracture of shaft of humerus, right arm, subsequent encounter for fracture with routine healing: Secondary | ICD-10-CM | POA: Diagnosis not present

## 2023-04-13 DIAGNOSIS — S86011D Strain of right Achilles tendon, subsequent encounter: Secondary | ICD-10-CM | POA: Diagnosis not present

## 2023-04-13 DIAGNOSIS — R739 Hyperglycemia, unspecified: Secondary | ICD-10-CM | POA: Diagnosis not present

## 2023-04-13 DIAGNOSIS — I471 Supraventricular tachycardia, unspecified: Secondary | ICD-10-CM | POA: Diagnosis not present

## 2023-04-13 DIAGNOSIS — S42254D Nondisplaced fracture of greater tuberosity of right humerus, subsequent encounter for fracture with routine healing: Secondary | ICD-10-CM | POA: Diagnosis not present

## 2023-04-13 DIAGNOSIS — K219 Gastro-esophageal reflux disease without esophagitis: Secondary | ICD-10-CM | POA: Diagnosis not present

## 2023-04-13 DIAGNOSIS — I10 Essential (primary) hypertension: Secondary | ICD-10-CM | POA: Diagnosis not present

## 2023-04-13 DIAGNOSIS — M1990 Unspecified osteoarthritis, unspecified site: Secondary | ICD-10-CM | POA: Diagnosis not present

## 2023-04-13 DIAGNOSIS — M25572 Pain in left ankle and joints of left foot: Secondary | ICD-10-CM | POA: Diagnosis not present

## 2023-04-13 DIAGNOSIS — M25562 Pain in left knee: Secondary | ICD-10-CM | POA: Diagnosis not present

## 2023-04-13 DIAGNOSIS — E785 Hyperlipidemia, unspecified: Secondary | ICD-10-CM | POA: Diagnosis not present

## 2023-04-13 DIAGNOSIS — M66879 Spontaneous rupture of other tendons, unspecified ankle and foot: Secondary | ICD-10-CM | POA: Diagnosis not present

## 2023-04-13 DIAGNOSIS — G4733 Obstructive sleep apnea (adult) (pediatric): Secondary | ICD-10-CM | POA: Diagnosis not present

## 2023-04-13 DIAGNOSIS — E063 Autoimmune thyroiditis: Secondary | ICD-10-CM | POA: Diagnosis not present

## 2023-04-13 DIAGNOSIS — A6920 Lyme disease, unspecified: Secondary | ICD-10-CM | POA: Diagnosis not present

## 2023-04-14 DIAGNOSIS — S42301D Unspecified fracture of shaft of humerus, right arm, subsequent encounter for fracture with routine healing: Secondary | ICD-10-CM | POA: Diagnosis not present

## 2023-04-14 DIAGNOSIS — M25572 Pain in left ankle and joints of left foot: Secondary | ICD-10-CM | POA: Diagnosis not present

## 2023-04-14 DIAGNOSIS — I471 Supraventricular tachycardia, unspecified: Secondary | ICD-10-CM | POA: Diagnosis not present

## 2023-04-14 DIAGNOSIS — E785 Hyperlipidemia, unspecified: Secondary | ICD-10-CM | POA: Diagnosis not present

## 2023-04-14 DIAGNOSIS — M66879 Spontaneous rupture of other tendons, unspecified ankle and foot: Secondary | ICD-10-CM | POA: Diagnosis not present

## 2023-04-14 DIAGNOSIS — E063 Autoimmune thyroiditis: Secondary | ICD-10-CM | POA: Diagnosis not present

## 2023-04-14 DIAGNOSIS — S86011D Strain of right Achilles tendon, subsequent encounter: Secondary | ICD-10-CM | POA: Diagnosis not present

## 2023-04-14 DIAGNOSIS — G4733 Obstructive sleep apnea (adult) (pediatric): Secondary | ICD-10-CM | POA: Diagnosis not present

## 2023-04-14 DIAGNOSIS — S42254D Nondisplaced fracture of greater tuberosity of right humerus, subsequent encounter for fracture with routine healing: Secondary | ICD-10-CM | POA: Diagnosis not present

## 2023-04-14 DIAGNOSIS — M1990 Unspecified osteoarthritis, unspecified site: Secondary | ICD-10-CM | POA: Diagnosis not present

## 2023-04-14 DIAGNOSIS — I4891 Unspecified atrial fibrillation: Secondary | ICD-10-CM | POA: Diagnosis not present

## 2023-04-14 DIAGNOSIS — K219 Gastro-esophageal reflux disease without esophagitis: Secondary | ICD-10-CM | POA: Diagnosis not present

## 2023-04-14 DIAGNOSIS — I10 Essential (primary) hypertension: Secondary | ICD-10-CM | POA: Diagnosis not present

## 2023-04-14 DIAGNOSIS — A6920 Lyme disease, unspecified: Secondary | ICD-10-CM | POA: Diagnosis not present

## 2023-04-14 DIAGNOSIS — R739 Hyperglycemia, unspecified: Secondary | ICD-10-CM | POA: Diagnosis not present

## 2023-04-14 DIAGNOSIS — M25562 Pain in left knee: Secondary | ICD-10-CM | POA: Diagnosis not present

## 2023-04-17 DIAGNOSIS — I4891 Unspecified atrial fibrillation: Secondary | ICD-10-CM | POA: Diagnosis not present

## 2023-04-17 DIAGNOSIS — G4733 Obstructive sleep apnea (adult) (pediatric): Secondary | ICD-10-CM | POA: Diagnosis not present

## 2023-04-17 DIAGNOSIS — M25562 Pain in left knee: Secondary | ICD-10-CM | POA: Diagnosis not present

## 2023-04-17 DIAGNOSIS — S42254D Nondisplaced fracture of greater tuberosity of right humerus, subsequent encounter for fracture with routine healing: Secondary | ICD-10-CM | POA: Diagnosis not present

## 2023-04-17 DIAGNOSIS — S86011D Strain of right Achilles tendon, subsequent encounter: Secondary | ICD-10-CM | POA: Diagnosis not present

## 2023-04-17 DIAGNOSIS — E785 Hyperlipidemia, unspecified: Secondary | ICD-10-CM | POA: Diagnosis not present

## 2023-04-17 DIAGNOSIS — S42301D Unspecified fracture of shaft of humerus, right arm, subsequent encounter for fracture with routine healing: Secondary | ICD-10-CM | POA: Diagnosis not present

## 2023-04-17 DIAGNOSIS — K080 Exfoliation of teeth due to systemic causes: Secondary | ICD-10-CM | POA: Diagnosis not present

## 2023-04-17 DIAGNOSIS — I10 Essential (primary) hypertension: Secondary | ICD-10-CM | POA: Diagnosis not present

## 2023-04-17 DIAGNOSIS — K219 Gastro-esophageal reflux disease without esophagitis: Secondary | ICD-10-CM | POA: Diagnosis not present

## 2023-04-17 DIAGNOSIS — R739 Hyperglycemia, unspecified: Secondary | ICD-10-CM | POA: Diagnosis not present

## 2023-04-17 DIAGNOSIS — M1990 Unspecified osteoarthritis, unspecified site: Secondary | ICD-10-CM | POA: Diagnosis not present

## 2023-04-17 DIAGNOSIS — E063 Autoimmune thyroiditis: Secondary | ICD-10-CM | POA: Diagnosis not present

## 2023-04-17 DIAGNOSIS — M25572 Pain in left ankle and joints of left foot: Secondary | ICD-10-CM | POA: Diagnosis not present

## 2023-04-17 DIAGNOSIS — M66879 Spontaneous rupture of other tendons, unspecified ankle and foot: Secondary | ICD-10-CM | POA: Diagnosis not present

## 2023-04-17 DIAGNOSIS — I471 Supraventricular tachycardia, unspecified: Secondary | ICD-10-CM | POA: Diagnosis not present

## 2023-04-17 DIAGNOSIS — A6920 Lyme disease, unspecified: Secondary | ICD-10-CM | POA: Diagnosis not present

## 2023-04-20 DIAGNOSIS — R739 Hyperglycemia, unspecified: Secondary | ICD-10-CM | POA: Diagnosis not present

## 2023-04-20 DIAGNOSIS — I4891 Unspecified atrial fibrillation: Secondary | ICD-10-CM | POA: Diagnosis not present

## 2023-04-20 DIAGNOSIS — S42301D Unspecified fracture of shaft of humerus, right arm, subsequent encounter for fracture with routine healing: Secondary | ICD-10-CM | POA: Diagnosis not present

## 2023-04-20 DIAGNOSIS — A6920 Lyme disease, unspecified: Secondary | ICD-10-CM | POA: Diagnosis not present

## 2023-04-20 DIAGNOSIS — S86011D Strain of right Achilles tendon, subsequent encounter: Secondary | ICD-10-CM | POA: Diagnosis not present

## 2023-04-20 DIAGNOSIS — Z961 Presence of intraocular lens: Secondary | ICD-10-CM | POA: Diagnosis not present

## 2023-04-20 DIAGNOSIS — G4733 Obstructive sleep apnea (adult) (pediatric): Secondary | ICD-10-CM | POA: Diagnosis not present

## 2023-04-20 DIAGNOSIS — K219 Gastro-esophageal reflux disease without esophagitis: Secondary | ICD-10-CM | POA: Diagnosis not present

## 2023-04-20 DIAGNOSIS — E785 Hyperlipidemia, unspecified: Secondary | ICD-10-CM | POA: Diagnosis not present

## 2023-04-20 DIAGNOSIS — S42254D Nondisplaced fracture of greater tuberosity of right humerus, subsequent encounter for fracture with routine healing: Secondary | ICD-10-CM | POA: Diagnosis not present

## 2023-04-20 DIAGNOSIS — I471 Supraventricular tachycardia, unspecified: Secondary | ICD-10-CM | POA: Diagnosis not present

## 2023-04-20 DIAGNOSIS — E063 Autoimmune thyroiditis: Secondary | ICD-10-CM | POA: Diagnosis not present

## 2023-04-20 DIAGNOSIS — H40032 Anatomical narrow angle, left eye: Secondary | ICD-10-CM | POA: Diagnosis not present

## 2023-04-20 DIAGNOSIS — I10 Essential (primary) hypertension: Secondary | ICD-10-CM | POA: Diagnosis not present

## 2023-04-20 DIAGNOSIS — H402212 Chronic angle-closure glaucoma, right eye, moderate stage: Secondary | ICD-10-CM | POA: Diagnosis not present

## 2023-04-20 DIAGNOSIS — M25562 Pain in left knee: Secondary | ICD-10-CM | POA: Diagnosis not present

## 2023-04-20 DIAGNOSIS — M25572 Pain in left ankle and joints of left foot: Secondary | ICD-10-CM | POA: Diagnosis not present

## 2023-04-20 DIAGNOSIS — M66879 Spontaneous rupture of other tendons, unspecified ankle and foot: Secondary | ICD-10-CM | POA: Diagnosis not present

## 2023-04-20 DIAGNOSIS — M1990 Unspecified osteoarthritis, unspecified site: Secondary | ICD-10-CM | POA: Diagnosis not present

## 2023-04-21 DIAGNOSIS — M66879 Spontaneous rupture of other tendons, unspecified ankle and foot: Secondary | ICD-10-CM | POA: Diagnosis not present

## 2023-04-21 DIAGNOSIS — I471 Supraventricular tachycardia, unspecified: Secondary | ICD-10-CM | POA: Diagnosis not present

## 2023-04-21 DIAGNOSIS — E063 Autoimmune thyroiditis: Secondary | ICD-10-CM | POA: Diagnosis not present

## 2023-04-21 DIAGNOSIS — I10 Essential (primary) hypertension: Secondary | ICD-10-CM | POA: Diagnosis not present

## 2023-04-21 DIAGNOSIS — K219 Gastro-esophageal reflux disease without esophagitis: Secondary | ICD-10-CM | POA: Diagnosis not present

## 2023-04-21 DIAGNOSIS — A6920 Lyme disease, unspecified: Secondary | ICD-10-CM | POA: Diagnosis not present

## 2023-04-21 DIAGNOSIS — S86011D Strain of right Achilles tendon, subsequent encounter: Secondary | ICD-10-CM | POA: Diagnosis not present

## 2023-04-21 DIAGNOSIS — R739 Hyperglycemia, unspecified: Secondary | ICD-10-CM | POA: Diagnosis not present

## 2023-04-21 DIAGNOSIS — M25572 Pain in left ankle and joints of left foot: Secondary | ICD-10-CM | POA: Diagnosis not present

## 2023-04-21 DIAGNOSIS — S42254D Nondisplaced fracture of greater tuberosity of right humerus, subsequent encounter for fracture with routine healing: Secondary | ICD-10-CM | POA: Diagnosis not present

## 2023-04-21 DIAGNOSIS — G4733 Obstructive sleep apnea (adult) (pediatric): Secondary | ICD-10-CM | POA: Diagnosis not present

## 2023-04-21 DIAGNOSIS — M25562 Pain in left knee: Secondary | ICD-10-CM | POA: Diagnosis not present

## 2023-04-21 DIAGNOSIS — I4891 Unspecified atrial fibrillation: Secondary | ICD-10-CM | POA: Diagnosis not present

## 2023-04-21 DIAGNOSIS — M1990 Unspecified osteoarthritis, unspecified site: Secondary | ICD-10-CM | POA: Diagnosis not present

## 2023-04-21 DIAGNOSIS — S42301D Unspecified fracture of shaft of humerus, right arm, subsequent encounter for fracture with routine healing: Secondary | ICD-10-CM | POA: Diagnosis not present

## 2023-04-21 DIAGNOSIS — E785 Hyperlipidemia, unspecified: Secondary | ICD-10-CM | POA: Diagnosis not present

## 2023-04-25 DIAGNOSIS — J309 Allergic rhinitis, unspecified: Secondary | ICD-10-CM | POA: Diagnosis not present

## 2023-04-25 DIAGNOSIS — E785 Hyperlipidemia, unspecified: Secondary | ICD-10-CM | POA: Diagnosis not present

## 2023-04-25 DIAGNOSIS — Z7901 Long term (current) use of anticoagulants: Secondary | ICD-10-CM | POA: Diagnosis not present

## 2023-04-25 DIAGNOSIS — M66879 Spontaneous rupture of other tendons, unspecified ankle and foot: Secondary | ICD-10-CM | POA: Diagnosis not present

## 2023-04-25 DIAGNOSIS — G4733 Obstructive sleep apnea (adult) (pediatric): Secondary | ICD-10-CM | POA: Diagnosis not present

## 2023-04-25 DIAGNOSIS — I10 Essential (primary) hypertension: Secondary | ICD-10-CM | POA: Diagnosis not present

## 2023-04-25 DIAGNOSIS — K219 Gastro-esophageal reflux disease without esophagitis: Secondary | ICD-10-CM | POA: Diagnosis not present

## 2023-04-25 DIAGNOSIS — S42254D Nondisplaced fracture of greater tuberosity of right humerus, subsequent encounter for fracture with routine healing: Secondary | ICD-10-CM | POA: Diagnosis not present

## 2023-04-25 DIAGNOSIS — Z993 Dependence on wheelchair: Secondary | ICD-10-CM | POA: Diagnosis not present

## 2023-04-25 DIAGNOSIS — M25572 Pain in left ankle and joints of left foot: Secondary | ICD-10-CM | POA: Diagnosis not present

## 2023-04-25 DIAGNOSIS — M1990 Unspecified osteoarthritis, unspecified site: Secondary | ICD-10-CM | POA: Diagnosis not present

## 2023-04-25 DIAGNOSIS — Z9181 History of falling: Secondary | ICD-10-CM | POA: Diagnosis not present

## 2023-04-25 DIAGNOSIS — I4891 Unspecified atrial fibrillation: Secondary | ICD-10-CM | POA: Diagnosis not present

## 2023-04-25 DIAGNOSIS — S86011D Strain of right Achilles tendon, subsequent encounter: Secondary | ICD-10-CM | POA: Diagnosis not present

## 2023-04-25 DIAGNOSIS — M25562 Pain in left knee: Secondary | ICD-10-CM | POA: Diagnosis not present

## 2023-04-25 DIAGNOSIS — R739 Hyperglycemia, unspecified: Secondary | ICD-10-CM | POA: Diagnosis not present

## 2023-04-25 DIAGNOSIS — S42301D Unspecified fracture of shaft of humerus, right arm, subsequent encounter for fracture with routine healing: Secondary | ICD-10-CM | POA: Diagnosis not present

## 2023-04-25 DIAGNOSIS — E063 Autoimmune thyroiditis: Secondary | ICD-10-CM | POA: Diagnosis not present

## 2023-04-25 DIAGNOSIS — W19XXXD Unspecified fall, subsequent encounter: Secondary | ICD-10-CM | POA: Diagnosis not present

## 2023-04-25 DIAGNOSIS — I471 Supraventricular tachycardia, unspecified: Secondary | ICD-10-CM | POA: Diagnosis not present

## 2023-04-25 DIAGNOSIS — R32 Unspecified urinary incontinence: Secondary | ICD-10-CM | POA: Diagnosis not present

## 2023-04-25 DIAGNOSIS — A6920 Lyme disease, unspecified: Secondary | ICD-10-CM | POA: Diagnosis not present

## 2023-04-25 DIAGNOSIS — Z86718 Personal history of other venous thrombosis and embolism: Secondary | ICD-10-CM | POA: Diagnosis not present

## 2023-04-27 ENCOUNTER — Telehealth: Payer: Self-pay | Admitting: Podiatry

## 2023-04-27 DIAGNOSIS — I471 Supraventricular tachycardia, unspecified: Secondary | ICD-10-CM | POA: Diagnosis not present

## 2023-04-27 DIAGNOSIS — M1990 Unspecified osteoarthritis, unspecified site: Secondary | ICD-10-CM | POA: Diagnosis not present

## 2023-04-27 DIAGNOSIS — I10 Essential (primary) hypertension: Secondary | ICD-10-CM | POA: Diagnosis not present

## 2023-04-27 DIAGNOSIS — S42254D Nondisplaced fracture of greater tuberosity of right humerus, subsequent encounter for fracture with routine healing: Secondary | ICD-10-CM | POA: Diagnosis not present

## 2023-04-27 DIAGNOSIS — G4733 Obstructive sleep apnea (adult) (pediatric): Secondary | ICD-10-CM | POA: Diagnosis not present

## 2023-04-27 DIAGNOSIS — A6920 Lyme disease, unspecified: Secondary | ICD-10-CM | POA: Diagnosis not present

## 2023-04-27 DIAGNOSIS — R739 Hyperglycemia, unspecified: Secondary | ICD-10-CM | POA: Diagnosis not present

## 2023-04-27 DIAGNOSIS — M66879 Spontaneous rupture of other tendons, unspecified ankle and foot: Secondary | ICD-10-CM | POA: Diagnosis not present

## 2023-04-27 DIAGNOSIS — I4891 Unspecified atrial fibrillation: Secondary | ICD-10-CM | POA: Diagnosis not present

## 2023-04-27 DIAGNOSIS — M25572 Pain in left ankle and joints of left foot: Secondary | ICD-10-CM | POA: Diagnosis not present

## 2023-04-27 DIAGNOSIS — E785 Hyperlipidemia, unspecified: Secondary | ICD-10-CM | POA: Diagnosis not present

## 2023-04-27 DIAGNOSIS — S86011D Strain of right Achilles tendon, subsequent encounter: Secondary | ICD-10-CM | POA: Diagnosis not present

## 2023-04-27 DIAGNOSIS — E063 Autoimmune thyroiditis: Secondary | ICD-10-CM | POA: Diagnosis not present

## 2023-04-27 DIAGNOSIS — K219 Gastro-esophageal reflux disease without esophagitis: Secondary | ICD-10-CM | POA: Diagnosis not present

## 2023-04-27 DIAGNOSIS — S42301D Unspecified fracture of shaft of humerus, right arm, subsequent encounter for fracture with routine healing: Secondary | ICD-10-CM | POA: Diagnosis not present

## 2023-04-27 DIAGNOSIS — M25562 Pain in left knee: Secondary | ICD-10-CM | POA: Diagnosis not present

## 2023-04-27 NOTE — Telephone Encounter (Signed)
 Pt called and will be out of her pain medication before her appt on 05/04/23. She only takes it at night before bed because she is up walking more in the daytime and it throbs at night. Please let pt know when sent in.

## 2023-05-01 ENCOUNTER — Other Ambulatory Visit: Payer: Self-pay | Admitting: Podiatry

## 2023-05-01 MED ORDER — OXYCODONE-ACETAMINOPHEN 5-325 MG PO TABS: 1.0000 | ORAL_TABLET | Freq: Four times a day (QID) | ORAL | 0 refills | Status: DC | PRN

## 2023-05-01 NOTE — Progress Notes (Signed)
Refill of percocet sent per pt request

## 2023-05-01 NOTE — Telephone Encounter (Signed)
Notified pt medication was sent in and she said thank you

## 2023-05-03 DIAGNOSIS — I10 Essential (primary) hypertension: Secondary | ICD-10-CM | POA: Diagnosis not present

## 2023-05-03 DIAGNOSIS — G4733 Obstructive sleep apnea (adult) (pediatric): Secondary | ICD-10-CM | POA: Diagnosis not present

## 2023-05-03 DIAGNOSIS — S42254D Nondisplaced fracture of greater tuberosity of right humerus, subsequent encounter for fracture with routine healing: Secondary | ICD-10-CM | POA: Diagnosis not present

## 2023-05-03 DIAGNOSIS — A6920 Lyme disease, unspecified: Secondary | ICD-10-CM | POA: Diagnosis not present

## 2023-05-03 DIAGNOSIS — S42301D Unspecified fracture of shaft of humerus, right arm, subsequent encounter for fracture with routine healing: Secondary | ICD-10-CM | POA: Diagnosis not present

## 2023-05-03 DIAGNOSIS — M1990 Unspecified osteoarthritis, unspecified site: Secondary | ICD-10-CM | POA: Diagnosis not present

## 2023-05-03 DIAGNOSIS — I4891 Unspecified atrial fibrillation: Secondary | ICD-10-CM | POA: Diagnosis not present

## 2023-05-03 DIAGNOSIS — I471 Supraventricular tachycardia, unspecified: Secondary | ICD-10-CM | POA: Diagnosis not present

## 2023-05-03 DIAGNOSIS — K219 Gastro-esophageal reflux disease without esophagitis: Secondary | ICD-10-CM | POA: Diagnosis not present

## 2023-05-03 DIAGNOSIS — E785 Hyperlipidemia, unspecified: Secondary | ICD-10-CM | POA: Diagnosis not present

## 2023-05-03 DIAGNOSIS — S86011D Strain of right Achilles tendon, subsequent encounter: Secondary | ICD-10-CM | POA: Diagnosis not present

## 2023-05-03 DIAGNOSIS — R739 Hyperglycemia, unspecified: Secondary | ICD-10-CM | POA: Diagnosis not present

## 2023-05-03 DIAGNOSIS — M25562 Pain in left knee: Secondary | ICD-10-CM | POA: Diagnosis not present

## 2023-05-03 DIAGNOSIS — M25572 Pain in left ankle and joints of left foot: Secondary | ICD-10-CM | POA: Diagnosis not present

## 2023-05-03 DIAGNOSIS — M66879 Spontaneous rupture of other tendons, unspecified ankle and foot: Secondary | ICD-10-CM | POA: Diagnosis not present

## 2023-05-03 DIAGNOSIS — E063 Autoimmune thyroiditis: Secondary | ICD-10-CM | POA: Diagnosis not present

## 2023-05-04 ENCOUNTER — Ambulatory Visit (INDEPENDENT_AMBULATORY_CARE_PROVIDER_SITE_OTHER): Payer: Medicare Other | Admitting: Podiatry

## 2023-05-04 ENCOUNTER — Encounter: Payer: Self-pay | Admitting: Podiatry

## 2023-05-04 VITALS — Ht 67.0 in | Wt 250.0 lb

## 2023-05-04 DIAGNOSIS — M9261 Juvenile osteochondrosis of tarsus, right ankle: Secondary | ICD-10-CM

## 2023-05-04 DIAGNOSIS — Z9889 Other specified postprocedural states: Secondary | ICD-10-CM

## 2023-05-04 DIAGNOSIS — M898X7 Other specified disorders of bone, ankle and foot: Secondary | ICD-10-CM

## 2023-05-04 DIAGNOSIS — S86011D Strain of right Achilles tendon, subsequent encounter: Secondary | ICD-10-CM

## 2023-05-04 NOTE — Progress Notes (Signed)
  Subjective:  Patient ID: Tamara Morgan, female    DOB: 01/02/1956,  MRN: [id number]   DOS: 02/10/2023 Procedure: 1.  FHL tendon transfer to posterior calcaneus, right ankle 2.  Repair of Achilles tendon rupture, right ankle  68 y.o. female seen for post op check.  Patient seen 11 weeks post op.  Patient reports she is doing well she is now walking with a cam boot without any heel lifts and using a walker for assistance.  She does have pain occasionally if she does a lot of walking.  Does have PT but it needs to be renewed  Review of Systems: Negative except as noted in the HPI. Denies N/V/F/Ch.   Objective:   There were no vitals filed for this visit.  Body mass index is 39.16 kg/m. Constitutional Well developed. Well nourished.  Vascular Foot warm and well perfused. Capillary refill normal to all digits.   No calf pain with palpation  Neurologic Normal speech. Oriented to person, place, and time. Epicritic sensation intact to toes  Dermatologic No open wound present on right heel, fully healed from prior  Orthopedic: Wiggles toes and sensation intact to all toes.  Absent hallux plantarflexion as expected.  Improving dorsiflexion and plantarflexion range of motion of the right ankle   Radiographs: Deferred  Pathology: N/A  Micro: N/A  Assessment:   Right Achilles tendon rupture status post prior retrocalcaneal exostectomy with secondary repair of Achilles now status post revision tendon repair and FHL tendon transfer  Plan:  Patient was evaluated and treated and all questions answered.  PO 11 week s/p FHL tendon transfer and right Achilles tendon rupture repair -Overall continuing to improve with increased range of motion and weightbearing status is improving -Begin to transition out of the cam boot approximately 1 to 2 weeks. -Walking is fine with regular good supportive running shoe or walking shoe -XR: Expected postoperative changes -WB Status: 1 more week in cam  boot and then transition back to regular shoe gear with walker assistance -Medications/ABX: Currently no indication for antibiotics -Continue physical therapy at home okay for range of motion exercises and transition to weightbearing physical therapy, gait training strengthening -Follow-up in 4 weeks         Corinna Gab, DPM [facility] / [facility]

## 2023-05-08 DIAGNOSIS — S42301D Unspecified fracture of shaft of humerus, right arm, subsequent encounter for fracture with routine healing: Secondary | ICD-10-CM | POA: Diagnosis not present

## 2023-05-08 DIAGNOSIS — S86011D Strain of right Achilles tendon, subsequent encounter: Secondary | ICD-10-CM | POA: Diagnosis not present

## 2023-05-08 DIAGNOSIS — K219 Gastro-esophageal reflux disease without esophagitis: Secondary | ICD-10-CM | POA: Diagnosis not present

## 2023-05-08 DIAGNOSIS — M25572 Pain in left ankle and joints of left foot: Secondary | ICD-10-CM | POA: Diagnosis not present

## 2023-05-08 DIAGNOSIS — I471 Supraventricular tachycardia, unspecified: Secondary | ICD-10-CM | POA: Diagnosis not present

## 2023-05-08 DIAGNOSIS — M1990 Unspecified osteoarthritis, unspecified site: Secondary | ICD-10-CM | POA: Diagnosis not present

## 2023-05-08 DIAGNOSIS — R739 Hyperglycemia, unspecified: Secondary | ICD-10-CM | POA: Diagnosis not present

## 2023-05-08 DIAGNOSIS — G4733 Obstructive sleep apnea (adult) (pediatric): Secondary | ICD-10-CM | POA: Diagnosis not present

## 2023-05-08 DIAGNOSIS — E785 Hyperlipidemia, unspecified: Secondary | ICD-10-CM | POA: Diagnosis not present

## 2023-05-08 DIAGNOSIS — M25562 Pain in left knee: Secondary | ICD-10-CM | POA: Diagnosis not present

## 2023-05-08 DIAGNOSIS — I10 Essential (primary) hypertension: Secondary | ICD-10-CM | POA: Diagnosis not present

## 2023-05-08 DIAGNOSIS — M66879 Spontaneous rupture of other tendons, unspecified ankle and foot: Secondary | ICD-10-CM | POA: Diagnosis not present

## 2023-05-08 DIAGNOSIS — I4891 Unspecified atrial fibrillation: Secondary | ICD-10-CM | POA: Diagnosis not present

## 2023-05-08 DIAGNOSIS — E063 Autoimmune thyroiditis: Secondary | ICD-10-CM | POA: Diagnosis not present

## 2023-05-08 DIAGNOSIS — A6920 Lyme disease, unspecified: Secondary | ICD-10-CM | POA: Diagnosis not present

## 2023-05-08 DIAGNOSIS — S42254D Nondisplaced fracture of greater tuberosity of right humerus, subsequent encounter for fracture with routine healing: Secondary | ICD-10-CM | POA: Diagnosis not present

## 2023-05-11 DIAGNOSIS — M66879 Spontaneous rupture of other tendons, unspecified ankle and foot: Secondary | ICD-10-CM | POA: Diagnosis not present

## 2023-05-11 DIAGNOSIS — I471 Supraventricular tachycardia, unspecified: Secondary | ICD-10-CM | POA: Diagnosis not present

## 2023-05-11 DIAGNOSIS — S42254D Nondisplaced fracture of greater tuberosity of right humerus, subsequent encounter for fracture with routine healing: Secondary | ICD-10-CM | POA: Diagnosis not present

## 2023-05-11 DIAGNOSIS — E785 Hyperlipidemia, unspecified: Secondary | ICD-10-CM | POA: Diagnosis not present

## 2023-05-11 DIAGNOSIS — K219 Gastro-esophageal reflux disease without esophagitis: Secondary | ICD-10-CM | POA: Diagnosis not present

## 2023-05-11 DIAGNOSIS — I4891 Unspecified atrial fibrillation: Secondary | ICD-10-CM | POA: Diagnosis not present

## 2023-05-11 DIAGNOSIS — M1990 Unspecified osteoarthritis, unspecified site: Secondary | ICD-10-CM | POA: Diagnosis not present

## 2023-05-11 DIAGNOSIS — E063 Autoimmune thyroiditis: Secondary | ICD-10-CM | POA: Diagnosis not present

## 2023-05-11 DIAGNOSIS — S42301D Unspecified fracture of shaft of humerus, right arm, subsequent encounter for fracture with routine healing: Secondary | ICD-10-CM | POA: Diagnosis not present

## 2023-05-11 DIAGNOSIS — R739 Hyperglycemia, unspecified: Secondary | ICD-10-CM | POA: Diagnosis not present

## 2023-05-11 DIAGNOSIS — A6920 Lyme disease, unspecified: Secondary | ICD-10-CM | POA: Diagnosis not present

## 2023-05-11 DIAGNOSIS — G4733 Obstructive sleep apnea (adult) (pediatric): Secondary | ICD-10-CM | POA: Diagnosis not present

## 2023-05-11 DIAGNOSIS — S86011D Strain of right Achilles tendon, subsequent encounter: Secondary | ICD-10-CM | POA: Diagnosis not present

## 2023-05-11 DIAGNOSIS — M25572 Pain in left ankle and joints of left foot: Secondary | ICD-10-CM | POA: Diagnosis not present

## 2023-05-11 DIAGNOSIS — I10 Essential (primary) hypertension: Secondary | ICD-10-CM | POA: Diagnosis not present

## 2023-05-11 DIAGNOSIS — M25562 Pain in left knee: Secondary | ICD-10-CM | POA: Diagnosis not present

## 2023-05-18 ENCOUNTER — Telehealth: Payer: Self-pay | Admitting: Podiatry

## 2023-05-18 ENCOUNTER — Other Ambulatory Visit (INDEPENDENT_AMBULATORY_CARE_PROVIDER_SITE_OTHER): Payer: Self-pay | Admitting: Podiatry

## 2023-05-18 MED ORDER — OXYCODONE-ACETAMINOPHEN 5-325 MG PO TABS: 1.0000 | ORAL_TABLET | Freq: Four times a day (QID) | ORAL | 0 refills | Status: DC | PRN

## 2023-05-18 NOTE — Telephone Encounter (Signed)
 Called and left a voicemail letting patient know that the refill was sent to the pharmacy.

## 2023-05-18 NOTE — Telephone Encounter (Signed)
 Patient called requesting a refill of pain medication. She would like [facility] to be sent to [facility] on [address] in [place]. Thank you.

## 2023-05-18 NOTE — Progress Notes (Signed)
Pain med refill sent

## 2023-05-19 DIAGNOSIS — E785 Hyperlipidemia, unspecified: Secondary | ICD-10-CM | POA: Diagnosis not present

## 2023-05-19 DIAGNOSIS — G4733 Obstructive sleep apnea (adult) (pediatric): Secondary | ICD-10-CM | POA: Diagnosis not present

## 2023-05-19 DIAGNOSIS — S86011D Strain of right Achilles tendon, subsequent encounter: Secondary | ICD-10-CM | POA: Diagnosis not present

## 2023-05-19 DIAGNOSIS — R739 Hyperglycemia, unspecified: Secondary | ICD-10-CM | POA: Diagnosis not present

## 2023-05-19 DIAGNOSIS — M1990 Unspecified osteoarthritis, unspecified site: Secondary | ICD-10-CM | POA: Diagnosis not present

## 2023-05-19 DIAGNOSIS — S42301D Unspecified fracture of shaft of humerus, right arm, subsequent encounter for fracture with routine healing: Secondary | ICD-10-CM | POA: Diagnosis not present

## 2023-05-19 DIAGNOSIS — S42254D Nondisplaced fracture of greater tuberosity of right humerus, subsequent encounter for fracture with routine healing: Secondary | ICD-10-CM | POA: Diagnosis not present

## 2023-05-19 DIAGNOSIS — I4891 Unspecified atrial fibrillation: Secondary | ICD-10-CM | POA: Diagnosis not present

## 2023-05-19 DIAGNOSIS — A6920 Lyme disease, unspecified: Secondary | ICD-10-CM | POA: Diagnosis not present

## 2023-05-19 DIAGNOSIS — I10 Essential (primary) hypertension: Secondary | ICD-10-CM | POA: Diagnosis not present

## 2023-05-19 DIAGNOSIS — K219 Gastro-esophageal reflux disease without esophagitis: Secondary | ICD-10-CM | POA: Diagnosis not present

## 2023-05-19 DIAGNOSIS — M25572 Pain in left ankle and joints of left foot: Secondary | ICD-10-CM | POA: Diagnosis not present

## 2023-05-19 DIAGNOSIS — M66879 Spontaneous rupture of other tendons, unspecified ankle and foot: Secondary | ICD-10-CM | POA: Diagnosis not present

## 2023-05-19 DIAGNOSIS — I471 Supraventricular tachycardia, unspecified: Secondary | ICD-10-CM | POA: Diagnosis not present

## 2023-05-19 DIAGNOSIS — M25562 Pain in left knee: Secondary | ICD-10-CM | POA: Diagnosis not present

## 2023-05-19 DIAGNOSIS — E063 Autoimmune thyroiditis: Secondary | ICD-10-CM | POA: Diagnosis not present

## 2023-05-23 DIAGNOSIS — E7800 Pure hypercholesterolemia, unspecified: Secondary | ICD-10-CM | POA: Diagnosis not present

## 2023-05-23 DIAGNOSIS — Z86718 Personal history of other venous thrombosis and embolism: Secondary | ICD-10-CM | POA: Diagnosis not present

## 2023-05-24 DIAGNOSIS — S86011D Strain of right Achilles tendon, subsequent encounter: Secondary | ICD-10-CM | POA: Diagnosis not present

## 2023-05-24 DIAGNOSIS — S42301D Unspecified fracture of shaft of humerus, right arm, subsequent encounter for fracture with routine healing: Secondary | ICD-10-CM | POA: Diagnosis not present

## 2023-05-24 DIAGNOSIS — I10 Essential (primary) hypertension: Secondary | ICD-10-CM | POA: Diagnosis not present

## 2023-05-24 DIAGNOSIS — S42254D Nondisplaced fracture of greater tuberosity of right humerus, subsequent encounter for fracture with routine healing: Secondary | ICD-10-CM | POA: Diagnosis not present

## 2023-05-30 ENCOUNTER — Telehealth: Payer: Self-pay

## 2023-05-30 NOTE — Telephone Encounter (Signed)
 Patient called and left a message - she has noticed 3 black dots along her incision - not sure if it's scab - she is concerned for necrotic tissue, which she has developed before. Still quite sore, still wearing boot - next appointment is 4/22 - please call [phone]

## 2023-06-01 DIAGNOSIS — G4733 Obstructive sleep apnea (adult) (pediatric): Secondary | ICD-10-CM | POA: Diagnosis not present

## 2023-06-01 DIAGNOSIS — Z9181 History of falling: Secondary | ICD-10-CM | POA: Diagnosis not present

## 2023-06-01 DIAGNOSIS — S86011D Strain of right Achilles tendon, subsequent encounter: Secondary | ICD-10-CM | POA: Diagnosis not present

## 2023-06-01 DIAGNOSIS — W19XXXD Unspecified fall, subsequent encounter: Secondary | ICD-10-CM | POA: Diagnosis not present

## 2023-06-01 DIAGNOSIS — S42301D Unspecified fracture of shaft of humerus, right arm, subsequent encounter for fracture with routine healing: Secondary | ICD-10-CM | POA: Diagnosis not present

## 2023-06-01 DIAGNOSIS — Z7901 Long term (current) use of anticoagulants: Secondary | ICD-10-CM | POA: Diagnosis not present

## 2023-06-01 DIAGNOSIS — M7042 Prepatellar bursitis, left knee: Secondary | ICD-10-CM | POA: Diagnosis not present

## 2023-06-01 DIAGNOSIS — R32 Unspecified urinary incontinence: Secondary | ICD-10-CM | POA: Diagnosis not present

## 2023-06-01 DIAGNOSIS — M7041 Prepatellar bursitis, right knee: Secondary | ICD-10-CM | POA: Diagnosis not present

## 2023-06-01 DIAGNOSIS — J45909 Unspecified asthma, uncomplicated: Secondary | ICD-10-CM | POA: Diagnosis not present

## 2023-06-01 DIAGNOSIS — I471 Supraventricular tachycardia, unspecified: Secondary | ICD-10-CM | POA: Diagnosis not present

## 2023-06-01 DIAGNOSIS — S42254D Nondisplaced fracture of greater tuberosity of right humerus, subsequent encounter for fracture with routine healing: Secondary | ICD-10-CM | POA: Diagnosis not present

## 2023-06-01 DIAGNOSIS — I739 Peripheral vascular disease, unspecified: Secondary | ICD-10-CM | POA: Diagnosis not present

## 2023-06-01 DIAGNOSIS — K219 Gastro-esophageal reflux disease without esophagitis: Secondary | ICD-10-CM | POA: Diagnosis not present

## 2023-06-01 DIAGNOSIS — Z86718 Personal history of other venous thrombosis and embolism: Secondary | ICD-10-CM | POA: Diagnosis not present

## 2023-06-01 DIAGNOSIS — I4891 Unspecified atrial fibrillation: Secondary | ICD-10-CM | POA: Diagnosis not present

## 2023-06-01 DIAGNOSIS — M1711 Unilateral primary osteoarthritis, right knee: Secondary | ICD-10-CM | POA: Diagnosis not present

## 2023-06-01 DIAGNOSIS — E785 Hyperlipidemia, unspecified: Secondary | ICD-10-CM | POA: Diagnosis not present

## 2023-06-01 DIAGNOSIS — I10 Essential (primary) hypertension: Secondary | ICD-10-CM | POA: Diagnosis not present

## 2023-06-01 DIAGNOSIS — E063 Autoimmune thyroiditis: Secondary | ICD-10-CM | POA: Diagnosis not present

## 2023-06-01 DIAGNOSIS — Z6841 Body Mass Index (BMI) 40.0 and over, adult: Secondary | ICD-10-CM | POA: Diagnosis not present

## 2023-06-01 DIAGNOSIS — E669 Obesity, unspecified: Secondary | ICD-10-CM | POA: Diagnosis not present

## 2023-06-01 DIAGNOSIS — A6920 Lyme disease, unspecified: Secondary | ICD-10-CM | POA: Diagnosis not present

## 2023-06-01 DIAGNOSIS — I890 Lymphedema, not elsewhere classified: Secondary | ICD-10-CM | POA: Diagnosis not present

## 2023-06-02 DIAGNOSIS — S86011D Strain of right Achilles tendon, subsequent encounter: Secondary | ICD-10-CM | POA: Diagnosis not present

## 2023-06-02 DIAGNOSIS — R32 Unspecified urinary incontinence: Secondary | ICD-10-CM | POA: Diagnosis not present

## 2023-06-02 DIAGNOSIS — I10 Essential (primary) hypertension: Secondary | ICD-10-CM | POA: Diagnosis not present

## 2023-06-02 DIAGNOSIS — E063 Autoimmune thyroiditis: Secondary | ICD-10-CM | POA: Diagnosis not present

## 2023-06-02 DIAGNOSIS — S42301D Unspecified fracture of shaft of humerus, right arm, subsequent encounter for fracture with routine healing: Secondary | ICD-10-CM | POA: Diagnosis not present

## 2023-06-02 DIAGNOSIS — J45909 Unspecified asthma, uncomplicated: Secondary | ICD-10-CM | POA: Diagnosis not present

## 2023-06-02 DIAGNOSIS — I739 Peripheral vascular disease, unspecified: Secondary | ICD-10-CM | POA: Diagnosis not present

## 2023-06-02 DIAGNOSIS — I471 Supraventricular tachycardia, unspecified: Secondary | ICD-10-CM | POA: Diagnosis not present

## 2023-06-02 DIAGNOSIS — G4733 Obstructive sleep apnea (adult) (pediatric): Secondary | ICD-10-CM | POA: Diagnosis not present

## 2023-06-02 DIAGNOSIS — A6920 Lyme disease, unspecified: Secondary | ICD-10-CM | POA: Diagnosis not present

## 2023-06-02 DIAGNOSIS — I4891 Unspecified atrial fibrillation: Secondary | ICD-10-CM | POA: Diagnosis not present

## 2023-06-02 DIAGNOSIS — I890 Lymphedema, not elsewhere classified: Secondary | ICD-10-CM | POA: Diagnosis not present

## 2023-06-02 DIAGNOSIS — S42254D Nondisplaced fracture of greater tuberosity of right humerus, subsequent encounter for fracture with routine healing: Secondary | ICD-10-CM | POA: Diagnosis not present

## 2023-06-02 DIAGNOSIS — E785 Hyperlipidemia, unspecified: Secondary | ICD-10-CM | POA: Diagnosis not present

## 2023-06-02 DIAGNOSIS — K219 Gastro-esophageal reflux disease without esophagitis: Secondary | ICD-10-CM | POA: Diagnosis not present

## 2023-06-02 DIAGNOSIS — M1711 Unilateral primary osteoarthritis, right knee: Secondary | ICD-10-CM | POA: Diagnosis not present

## 2023-06-06 ENCOUNTER — Ambulatory Visit (INDEPENDENT_AMBULATORY_CARE_PROVIDER_SITE_OTHER): Admitting: Podiatry

## 2023-06-06 ENCOUNTER — Encounter: Payer: Self-pay | Admitting: Podiatry

## 2023-06-06 VITALS — Ht 67.0 in | Wt 250.0 lb

## 2023-06-06 DIAGNOSIS — Z9889 Other specified postprocedural states: Secondary | ICD-10-CM | POA: Diagnosis not present

## 2023-06-06 DIAGNOSIS — S86011D Strain of right Achilles tendon, subsequent encounter: Secondary | ICD-10-CM

## 2023-06-06 DIAGNOSIS — M898X7 Other specified disorders of bone, ankle and foot: Secondary | ICD-10-CM

## 2023-06-06 MED ORDER — PREGABALIN 75 MG PO CAPS: 75.0000 mg | ORAL_CAPSULE | Freq: Two times a day (BID) | ORAL | 2 refills | Status: DC

## 2023-06-06 MED ORDER — OXYCODONE-ACETAMINOPHEN 5-325 MG PO TABS: 1.0000 | ORAL_TABLET | ORAL | 0 refills | Status: AC | PRN

## 2023-06-06 NOTE — Progress Notes (Signed)
  Subjective:  Patient ID: Tamara Morgan, female    DOB: [birth year],  MRN: [id number]   DOS: 02/10/2023 Procedure: 1.  FHL tendon transfer to posterior calcaneus, right ankle 2.  Repair of Achilles tendon rupture, right ankle  67 y.o. female seen for post op check.  Patient seen 4 mo post op.   She is still having to walk in the cam boot as she could not find a shoe that would fit her right foot due to edema.  Still having a lot of shooting/shocking type pains not taking any does take Percocet occasionally.  Review of Systems: Negative except as noted in the HPI. Denies N/V/F/Ch.   Objective:   There were no vitals filed for this visit.  Body mass index is 39.16 kg/m. Constitutional Well developed. Well nourished.  Vascular Foot warm and well perfused. Capillary refill normal to all digits.   No calf pain with palpation  Neurologic Normal speech. Oriented to person, place, and time. Epicritic sensation intact to toes  Dermatologic No open wound present on right heel, fully healed from prior  Orthopedic: Wiggles toes and sensation intact to all toes.  Absent hallux plantarflexion as expected.  Improving dorsiflexion and plantarflexion range of motion of the right ankle   Radiographs: Deferred  Pathology: N/A  Micro: N/A  Assessment:   Right Achilles tendon rupture status post prior retrocalcaneal exostectomy with secondary repair of Achilles now status post revision tendon repair and FHL tendon transfer  Plan:  Patient was evaluated and treated and all questions answered.  4 mo s/p FHL tendon transfer and right Achilles tendon rupture repair -Overall continuing to improve with increased range of motion , though still struggling with some pain control and in the cam boot still - Will add Lyrica  pain control.  Refill of Percocet sent -Walking is fine with regular good supportive running shoe or walking shoe -XR: Deferred -WB Status: WBAT in regular shoe  gear -Medications/ABX: Refill of Percocet and will add Lyrica  75 mg twice daily -Continue physical therapy  - Will see patient back in 6 weeks for recheck would like to see her improve her mobility and get out of the cam boot         Maridee Shoemaker, DPM [facility] / [facility]

## 2023-06-08 DIAGNOSIS — K219 Gastro-esophageal reflux disease without esophagitis: Secondary | ICD-10-CM | POA: Diagnosis not present

## 2023-06-08 DIAGNOSIS — A6920 Lyme disease, unspecified: Secondary | ICD-10-CM | POA: Diagnosis not present

## 2023-06-08 DIAGNOSIS — I890 Lymphedema, not elsewhere classified: Secondary | ICD-10-CM | POA: Diagnosis not present

## 2023-06-08 DIAGNOSIS — R32 Unspecified urinary incontinence: Secondary | ICD-10-CM | POA: Diagnosis not present

## 2023-06-08 DIAGNOSIS — E785 Hyperlipidemia, unspecified: Secondary | ICD-10-CM | POA: Diagnosis not present

## 2023-06-08 DIAGNOSIS — M1711 Unilateral primary osteoarthritis, right knee: Secondary | ICD-10-CM | POA: Diagnosis not present

## 2023-06-08 DIAGNOSIS — I471 Supraventricular tachycardia, unspecified: Secondary | ICD-10-CM | POA: Diagnosis not present

## 2023-06-08 DIAGNOSIS — J45909 Unspecified asthma, uncomplicated: Secondary | ICD-10-CM | POA: Diagnosis not present

## 2023-06-08 DIAGNOSIS — S42254D Nondisplaced fracture of greater tuberosity of right humerus, subsequent encounter for fracture with routine healing: Secondary | ICD-10-CM | POA: Diagnosis not present

## 2023-06-08 DIAGNOSIS — I4891 Unspecified atrial fibrillation: Secondary | ICD-10-CM | POA: Diagnosis not present

## 2023-06-08 DIAGNOSIS — S42301D Unspecified fracture of shaft of humerus, right arm, subsequent encounter for fracture with routine healing: Secondary | ICD-10-CM | POA: Diagnosis not present

## 2023-06-08 DIAGNOSIS — S86011D Strain of right Achilles tendon, subsequent encounter: Secondary | ICD-10-CM | POA: Diagnosis not present

## 2023-06-08 DIAGNOSIS — I10 Essential (primary) hypertension: Secondary | ICD-10-CM | POA: Diagnosis not present

## 2023-06-08 DIAGNOSIS — I739 Peripheral vascular disease, unspecified: Secondary | ICD-10-CM | POA: Diagnosis not present

## 2023-06-08 DIAGNOSIS — E063 Autoimmune thyroiditis: Secondary | ICD-10-CM | POA: Diagnosis not present

## 2023-06-08 DIAGNOSIS — G4733 Obstructive sleep apnea (adult) (pediatric): Secondary | ICD-10-CM | POA: Diagnosis not present

## 2023-06-12 DIAGNOSIS — K219 Gastro-esophageal reflux disease without esophagitis: Secondary | ICD-10-CM | POA: Diagnosis not present

## 2023-06-12 DIAGNOSIS — I471 Supraventricular tachycardia, unspecified: Secondary | ICD-10-CM | POA: Diagnosis not present

## 2023-06-12 DIAGNOSIS — E785 Hyperlipidemia, unspecified: Secondary | ICD-10-CM | POA: Diagnosis not present

## 2023-06-12 DIAGNOSIS — M1711 Unilateral primary osteoarthritis, right knee: Secondary | ICD-10-CM | POA: Diagnosis not present

## 2023-06-12 DIAGNOSIS — S42254D Nondisplaced fracture of greater tuberosity of right humerus, subsequent encounter for fracture with routine healing: Secondary | ICD-10-CM | POA: Diagnosis not present

## 2023-06-12 DIAGNOSIS — I10 Essential (primary) hypertension: Secondary | ICD-10-CM | POA: Diagnosis not present

## 2023-06-12 DIAGNOSIS — E063 Autoimmune thyroiditis: Secondary | ICD-10-CM | POA: Diagnosis not present

## 2023-06-12 DIAGNOSIS — I4891 Unspecified atrial fibrillation: Secondary | ICD-10-CM | POA: Diagnosis not present

## 2023-06-12 DIAGNOSIS — S86011D Strain of right Achilles tendon, subsequent encounter: Secondary | ICD-10-CM | POA: Diagnosis not present

## 2023-06-12 DIAGNOSIS — I890 Lymphedema, not elsewhere classified: Secondary | ICD-10-CM | POA: Diagnosis not present

## 2023-06-12 DIAGNOSIS — R32 Unspecified urinary incontinence: Secondary | ICD-10-CM | POA: Diagnosis not present

## 2023-06-12 DIAGNOSIS — S42301D Unspecified fracture of shaft of humerus, right arm, subsequent encounter for fracture with routine healing: Secondary | ICD-10-CM | POA: Diagnosis not present

## 2023-06-12 DIAGNOSIS — J45909 Unspecified asthma, uncomplicated: Secondary | ICD-10-CM | POA: Diagnosis not present

## 2023-06-12 DIAGNOSIS — G4733 Obstructive sleep apnea (adult) (pediatric): Secondary | ICD-10-CM | POA: Diagnosis not present

## 2023-06-12 DIAGNOSIS — A6920 Lyme disease, unspecified: Secondary | ICD-10-CM | POA: Diagnosis not present

## 2023-06-12 DIAGNOSIS — I739 Peripheral vascular disease, unspecified: Secondary | ICD-10-CM | POA: Diagnosis not present

## 2023-06-14 DIAGNOSIS — I890 Lymphedema, not elsewhere classified: Secondary | ICD-10-CM | POA: Diagnosis not present

## 2023-06-14 DIAGNOSIS — S42301D Unspecified fracture of shaft of humerus, right arm, subsequent encounter for fracture with routine healing: Secondary | ICD-10-CM | POA: Diagnosis not present

## 2023-06-14 DIAGNOSIS — G4733 Obstructive sleep apnea (adult) (pediatric): Secondary | ICD-10-CM | POA: Diagnosis not present

## 2023-06-14 DIAGNOSIS — E785 Hyperlipidemia, unspecified: Secondary | ICD-10-CM | POA: Diagnosis not present

## 2023-06-14 DIAGNOSIS — E063 Autoimmune thyroiditis: Secondary | ICD-10-CM | POA: Diagnosis not present

## 2023-06-14 DIAGNOSIS — S42254D Nondisplaced fracture of greater tuberosity of right humerus, subsequent encounter for fracture with routine healing: Secondary | ICD-10-CM | POA: Diagnosis not present

## 2023-06-14 DIAGNOSIS — J45909 Unspecified asthma, uncomplicated: Secondary | ICD-10-CM | POA: Diagnosis not present

## 2023-06-14 DIAGNOSIS — I739 Peripheral vascular disease, unspecified: Secondary | ICD-10-CM | POA: Diagnosis not present

## 2023-06-14 DIAGNOSIS — I471 Supraventricular tachycardia, unspecified: Secondary | ICD-10-CM | POA: Diagnosis not present

## 2023-06-14 DIAGNOSIS — I10 Essential (primary) hypertension: Secondary | ICD-10-CM | POA: Diagnosis not present

## 2023-06-14 DIAGNOSIS — A6920 Lyme disease, unspecified: Secondary | ICD-10-CM | POA: Diagnosis not present

## 2023-06-14 DIAGNOSIS — S86011D Strain of right Achilles tendon, subsequent encounter: Secondary | ICD-10-CM | POA: Diagnosis not present

## 2023-06-14 DIAGNOSIS — K219 Gastro-esophageal reflux disease without esophagitis: Secondary | ICD-10-CM | POA: Diagnosis not present

## 2023-06-14 DIAGNOSIS — M1711 Unilateral primary osteoarthritis, right knee: Secondary | ICD-10-CM | POA: Diagnosis not present

## 2023-06-14 DIAGNOSIS — I4891 Unspecified atrial fibrillation: Secondary | ICD-10-CM | POA: Diagnosis not present

## 2023-06-14 DIAGNOSIS — R32 Unspecified urinary incontinence: Secondary | ICD-10-CM | POA: Diagnosis not present

## 2023-06-18 DIAGNOSIS — L03213 Periorbital cellulitis: Secondary | ICD-10-CM | POA: Diagnosis not present

## 2023-06-18 DIAGNOSIS — S0501XA Injury of conjunctiva and corneal abrasion without foreign body, right eye, initial encounter: Secondary | ICD-10-CM | POA: Diagnosis not present

## 2023-06-21 ENCOUNTER — Other Ambulatory Visit: Payer: Self-pay | Admitting: Podiatry

## 2023-06-21 ENCOUNTER — Telehealth: Payer: Self-pay | Admitting: Podiatry

## 2023-06-21 MED ORDER — OXYCODONE-ACETAMINOPHEN 5-325 MG PO TABS: 1.0000 | ORAL_TABLET | Freq: Four times a day (QID) | ORAL | 0 refills | Status: DC | PRN

## 2023-06-21 NOTE — Telephone Encounter (Signed)
 Patient called and would like a refill of pain medication. [facility] [address] Thank you.

## 2023-06-21 NOTE — Telephone Encounter (Signed)
 Called and let patient know that medication was sent into pharmacy.

## 2023-06-22 DIAGNOSIS — I10 Essential (primary) hypertension: Secondary | ICD-10-CM | POA: Diagnosis not present

## 2023-06-27 DIAGNOSIS — H10411 Chronic giant papillary conjunctivitis, right eye: Secondary | ICD-10-CM | POA: Diagnosis not present

## 2023-06-29 DIAGNOSIS — S42254D Nondisplaced fracture of greater tuberosity of right humerus, subsequent encounter for fracture with routine healing: Secondary | ICD-10-CM | POA: Diagnosis not present

## 2023-06-29 DIAGNOSIS — E669 Obesity, unspecified: Secondary | ICD-10-CM | POA: Diagnosis not present

## 2023-06-29 DIAGNOSIS — I4891 Unspecified atrial fibrillation: Secondary | ICD-10-CM | POA: Diagnosis not present

## 2023-06-29 DIAGNOSIS — R32 Unspecified urinary incontinence: Secondary | ICD-10-CM | POA: Diagnosis not present

## 2023-06-29 DIAGNOSIS — E063 Autoimmune thyroiditis: Secondary | ICD-10-CM | POA: Diagnosis not present

## 2023-06-29 DIAGNOSIS — I890 Lymphedema, not elsewhere classified: Secondary | ICD-10-CM | POA: Diagnosis not present

## 2023-06-29 DIAGNOSIS — I739 Peripheral vascular disease, unspecified: Secondary | ICD-10-CM | POA: Diagnosis not present

## 2023-06-29 DIAGNOSIS — A6920 Lyme disease, unspecified: Secondary | ICD-10-CM | POA: Diagnosis not present

## 2023-06-29 DIAGNOSIS — I10 Essential (primary) hypertension: Secondary | ICD-10-CM | POA: Diagnosis not present

## 2023-06-29 DIAGNOSIS — K219 Gastro-esophageal reflux disease without esophagitis: Secondary | ICD-10-CM | POA: Diagnosis not present

## 2023-06-29 DIAGNOSIS — W19XXXD Unspecified fall, subsequent encounter: Secondary | ICD-10-CM | POA: Diagnosis not present

## 2023-06-29 DIAGNOSIS — E785 Hyperlipidemia, unspecified: Secondary | ICD-10-CM | POA: Diagnosis not present

## 2023-06-29 DIAGNOSIS — S86011D Strain of right Achilles tendon, subsequent encounter: Secondary | ICD-10-CM | POA: Diagnosis not present

## 2023-06-29 DIAGNOSIS — Z6841 Body Mass Index (BMI) 40.0 and over, adult: Secondary | ICD-10-CM | POA: Diagnosis not present

## 2023-06-29 DIAGNOSIS — S42301D Unspecified fracture of shaft of humerus, right arm, subsequent encounter for fracture with routine healing: Secondary | ICD-10-CM | POA: Diagnosis not present

## 2023-06-29 DIAGNOSIS — J45909 Unspecified asthma, uncomplicated: Secondary | ICD-10-CM | POA: Diagnosis not present

## 2023-06-29 DIAGNOSIS — Z86718 Personal history of other venous thrombosis and embolism: Secondary | ICD-10-CM | POA: Diagnosis not present

## 2023-06-29 DIAGNOSIS — Z7901 Long term (current) use of anticoagulants: Secondary | ICD-10-CM | POA: Diagnosis not present

## 2023-06-29 DIAGNOSIS — I471 Supraventricular tachycardia, unspecified: Secondary | ICD-10-CM | POA: Diagnosis not present

## 2023-06-29 DIAGNOSIS — G4733 Obstructive sleep apnea (adult) (pediatric): Secondary | ICD-10-CM | POA: Diagnosis not present

## 2023-06-29 DIAGNOSIS — M1711 Unilateral primary osteoarthritis, right knee: Secondary | ICD-10-CM | POA: Diagnosis not present

## 2023-06-29 DIAGNOSIS — M7041 Prepatellar bursitis, right knee: Secondary | ICD-10-CM | POA: Diagnosis not present

## 2023-06-29 DIAGNOSIS — Z9181 History of falling: Secondary | ICD-10-CM | POA: Diagnosis not present

## 2023-06-29 DIAGNOSIS — M7042 Prepatellar bursitis, left knee: Secondary | ICD-10-CM | POA: Diagnosis not present

## 2023-07-11 ENCOUNTER — Telehealth: Payer: Self-pay | Admitting: Podiatry

## 2023-07-11 ENCOUNTER — Other Ambulatory Visit: Payer: Self-pay | Admitting: Podiatry

## 2023-07-11 DIAGNOSIS — S42301D Unspecified fracture of shaft of humerus, right arm, subsequent encounter for fracture with routine healing: Secondary | ICD-10-CM | POA: Diagnosis not present

## 2023-07-11 DIAGNOSIS — E785 Hyperlipidemia, unspecified: Secondary | ICD-10-CM | POA: Diagnosis not present

## 2023-07-11 DIAGNOSIS — Z9181 History of falling: Secondary | ICD-10-CM | POA: Diagnosis not present

## 2023-07-11 DIAGNOSIS — E669 Obesity, unspecified: Secondary | ICD-10-CM | POA: Diagnosis not present

## 2023-07-11 DIAGNOSIS — S42254D Nondisplaced fracture of greater tuberosity of right humerus, subsequent encounter for fracture with routine healing: Secondary | ICD-10-CM | POA: Diagnosis not present

## 2023-07-11 DIAGNOSIS — G4733 Obstructive sleep apnea (adult) (pediatric): Secondary | ICD-10-CM | POA: Diagnosis not present

## 2023-07-11 DIAGNOSIS — J45909 Unspecified asthma, uncomplicated: Secondary | ICD-10-CM | POA: Diagnosis not present

## 2023-07-11 DIAGNOSIS — I471 Supraventricular tachycardia, unspecified: Secondary | ICD-10-CM | POA: Diagnosis not present

## 2023-07-11 DIAGNOSIS — S86011D Strain of right Achilles tendon, subsequent encounter: Secondary | ICD-10-CM | POA: Diagnosis not present

## 2023-07-11 DIAGNOSIS — M1711 Unilateral primary osteoarthritis, right knee: Secondary | ICD-10-CM | POA: Diagnosis not present

## 2023-07-11 DIAGNOSIS — K219 Gastro-esophageal reflux disease without esophagitis: Secondary | ICD-10-CM | POA: Diagnosis not present

## 2023-07-11 DIAGNOSIS — Z86718 Personal history of other venous thrombosis and embolism: Secondary | ICD-10-CM | POA: Diagnosis not present

## 2023-07-11 DIAGNOSIS — M7042 Prepatellar bursitis, left knee: Secondary | ICD-10-CM | POA: Diagnosis not present

## 2023-07-11 DIAGNOSIS — E063 Autoimmune thyroiditis: Secondary | ICD-10-CM | POA: Diagnosis not present

## 2023-07-11 DIAGNOSIS — I739 Peripheral vascular disease, unspecified: Secondary | ICD-10-CM | POA: Diagnosis not present

## 2023-07-11 DIAGNOSIS — I10 Essential (primary) hypertension: Secondary | ICD-10-CM | POA: Diagnosis not present

## 2023-07-11 DIAGNOSIS — M7041 Prepatellar bursitis, right knee: Secondary | ICD-10-CM | POA: Diagnosis not present

## 2023-07-11 DIAGNOSIS — I890 Lymphedema, not elsewhere classified: Secondary | ICD-10-CM | POA: Diagnosis not present

## 2023-07-11 DIAGNOSIS — W19XXXD Unspecified fall, subsequent encounter: Secondary | ICD-10-CM | POA: Diagnosis not present

## 2023-07-11 DIAGNOSIS — A6920 Lyme disease, unspecified: Secondary | ICD-10-CM | POA: Diagnosis not present

## 2023-07-11 DIAGNOSIS — I4891 Unspecified atrial fibrillation: Secondary | ICD-10-CM | POA: Diagnosis not present

## 2023-07-11 DIAGNOSIS — R32 Unspecified urinary incontinence: Secondary | ICD-10-CM | POA: Diagnosis not present

## 2023-07-11 DIAGNOSIS — Z7901 Long term (current) use of anticoagulants: Secondary | ICD-10-CM | POA: Diagnosis not present

## 2023-07-11 DIAGNOSIS — Z6841 Body Mass Index (BMI) 40.0 and over, adult: Secondary | ICD-10-CM | POA: Diagnosis not present

## 2023-07-11 MED ORDER — OXYCODONE-ACETAMINOPHEN 5-325 MG PO TABS: 1.0000 | ORAL_TABLET | Freq: Four times a day (QID) | ORAL | 0 refills | Status: DC | PRN

## 2023-07-11 NOTE — Telephone Encounter (Signed)
 Patient is requesting refill (Oxycodone ) Patient contact telephone number, [phone]

## 2023-07-11 NOTE — Telephone Encounter (Signed)
Patient informed that refill was sent.

## 2023-07-18 ENCOUNTER — Ambulatory Visit (INDEPENDENT_AMBULATORY_CARE_PROVIDER_SITE_OTHER): Admitting: Podiatry

## 2023-07-18 DIAGNOSIS — Z9889 Other specified postprocedural states: Secondary | ICD-10-CM | POA: Diagnosis not present

## 2023-07-18 DIAGNOSIS — M19071 Primary osteoarthritis, right ankle and foot: Secondary | ICD-10-CM

## 2023-07-18 DIAGNOSIS — M898X7 Other specified disorders of bone, ankle and foot: Secondary | ICD-10-CM

## 2023-07-18 DIAGNOSIS — S86011D Strain of right Achilles tendon, subsequent encounter: Secondary | ICD-10-CM

## 2023-07-18 NOTE — Progress Notes (Unsigned)
 Subjective:  Patient ID: Tamara Morgan, female    DOB: 1956/02/12,  MRN: [id number]   DOS: 02/10/2023 Procedure: 1.  FHL tendon transfer to posterior calcaneus, right ankle 2.  Repair of Achilles tendon rupture, right ankle  68 y.o. female seen for post op check.  Patient seen 5.5 mo post op.    Patient reports improvement since prior visit.  She does still have a lot of soreness with extended duration walking.  Relates a recent episode where she was walking in grocery store for extended period of time and had severe pain following this.  She did need some Percocet after that.  She says the pain radiates from the back of her ankle down to the bottom of her foot in the heel area and along the arch.  Also has pain on the outside of the foot feels like it is in the joint below her ankle.  Only taking a Percocet as needed for sleep aid and not all the time just occasionally if she has a bad day.  She is using a lace up style ankle brace which is helping with pain.  She is ambulating without the cam boot at this time.  Has completed physical therapy but is still doing home physical therapy.  Also purchased a heating and cooling device for therapy on the right ankle.  Review of Systems: Negative except as noted in the HPI. Denies N/V/F/Ch.   Objective:   There were no vitals filed for this visit.  There is no height or weight on file to calculate BMI. Constitutional Well developed. Well nourished.  Vascular Foot warm and well perfused. Capillary refill normal to all digits.   No calf pain with palpation  Neurologic Normal speech. Oriented to person, place, and time. Epicritic sensation intact to toes  Dermatologic No open wound present on right heel, fully healed from prior  Orthopedic: Wiggles toes and sensation intact to all toes.  Absent hallux plantarflexion as expected.  Able to dorsiflex the ankle to neutral from its resting position and plantarflexion however does not have much extra  dorsiflexion past 90 degrees.  Plantarflexion and dorsiflexion strength are within normal limits for her procedures, 4+/5.  Patient does have pain with palpation of the subtalar joint especially laterally just anterior to the lateral malleolus   Radiographs: Deferred  Pathology: N/A  Micro: N/A  Assessment:   Right Achilles tendon rupture status post prior retrocalcaneal exostectomy with secondary repair of Achilles now status post revision tendon repair and FHL tendon transfer  Plan:  Patient was evaluated and treated and all questions answered.  5.5 mo s/p FHL tendon transfer and right Achilles tendon rupture repair -Overall continuing to improve with increased range of motion, decreasing pain though still with some soreness with extended duration standing and walking - Continue home physical therapy regimen that she has been doing.  Believe further stretching and strengthening will continue to help with her overall recovery -Recommend massage to the posterior ankle and plantar foot and use heating device for assistance - Continue walking as tolerated by pain and good supportive shoe gear and ankle brace. -Okay to get refill of Percocet as needed for pain control -Discussed that her tightness in the back of her ankle and calf is due to the FHL transfer in addition to Achilles rupture repair and scar tissue formation as well as overall tightness due to healthy tendon tissue loss from the injury.  If any further treatment review pursued would likely need MRI to further  assess the posterior and lateral ankle, eval for secondary arthritic changes of the ankle and subtalar joint -XR: Deferred -WB Status: WBAT in regular shoe gear -Medications/ABX: Percocet refilled as needed - Overall patient is nearly maximally improved following revision repair of Achilles with FHL transfer.  Continue on her current rehab and continue to monitor progress.  If she has worsening pain recommend she call to get  an appointment sooner otherwise we will see in 4 months for long-term follow-up  # Subtalar joint arthritis - Did discuss that I do believe patient has some component of subtalar arthritis both from postoperative as well as prior injuries including traumatic ankle sprains to the right ankle. - Recommend steroid injection and patient was agreeable.  After sterile prep injected 1 cc half percent Marcaine  plain with 1 cc Kenalog 10 into the subtalar joint from the sinus tarsi portal.  Patient tolerated well and bandage was applied. - Continue above therapies and pain control as needed continue ankle brace and good supportive shoe gear to limit inversion eversion range of motion when walking.         Maridee Shoemaker, DPM [facility] / [facility]

## 2023-07-19 ENCOUNTER — Encounter: Payer: Self-pay | Admitting: Podiatry

## 2023-07-20 DIAGNOSIS — J302 Other seasonal allergic rhinitis: Secondary | ICD-10-CM | POA: Diagnosis not present

## 2023-07-20 DIAGNOSIS — G4733 Obstructive sleep apnea (adult) (pediatric): Secondary | ICD-10-CM | POA: Diagnosis not present

## 2023-07-20 DIAGNOSIS — H903 Sensorineural hearing loss, bilateral: Secondary | ICD-10-CM | POA: Diagnosis not present

## 2023-07-20 DIAGNOSIS — H9313 Tinnitus, bilateral: Secondary | ICD-10-CM | POA: Diagnosis not present

## 2023-07-20 DIAGNOSIS — M26622 Arthralgia of left temporomandibular joint: Secondary | ICD-10-CM | POA: Diagnosis not present

## 2023-07-26 DIAGNOSIS — R002 Palpitations: Secondary | ICD-10-CM | POA: Diagnosis not present

## 2023-07-26 DIAGNOSIS — R609 Edema, unspecified: Secondary | ICD-10-CM | POA: Diagnosis not present

## 2023-07-26 DIAGNOSIS — I480 Paroxysmal atrial fibrillation: Secondary | ICD-10-CM | POA: Diagnosis not present

## 2023-07-26 DIAGNOSIS — I10 Essential (primary) hypertension: Secondary | ICD-10-CM | POA: Diagnosis not present

## 2023-07-31 ENCOUNTER — Telehealth: Payer: Self-pay | Admitting: Podiatry

## 2023-07-31 NOTE — Telephone Encounter (Signed)
 Patient called needing refill on Pregabalin  75 mg and a refill of the Oxycodone -acetaminophen  5-35 mg sent to the pharmacy on file. Thanks!

## 2023-08-03 ENCOUNTER — Other Ambulatory Visit: Payer: Self-pay | Admitting: Podiatry

## 2023-08-03 DIAGNOSIS — E2839 Other primary ovarian failure: Secondary | ICD-10-CM | POA: Diagnosis not present

## 2023-08-03 DIAGNOSIS — M8588 Other specified disorders of bone density and structure, other site: Secondary | ICD-10-CM | POA: Diagnosis not present

## 2023-08-03 DIAGNOSIS — N958 Other specified menopausal and perimenopausal disorders: Secondary | ICD-10-CM | POA: Diagnosis not present

## 2023-08-03 MED ORDER — OXYCODONE-ACETAMINOPHEN 5-325 MG PO TABS: 1.0000 | ORAL_TABLET | Freq: Four times a day (QID) | ORAL | 0 refills | Status: DC | PRN

## 2023-08-03 NOTE — Progress Notes (Signed)
Rx for percocet sent

## 2023-08-04 DIAGNOSIS — H903 Sensorineural hearing loss, bilateral: Secondary | ICD-10-CM | POA: Diagnosis not present

## 2023-08-04 DIAGNOSIS — H918X3 Other specified hearing loss, bilateral: Secondary | ICD-10-CM | POA: Diagnosis not present

## 2023-08-04 DIAGNOSIS — H9202 Otalgia, left ear: Secondary | ICD-10-CM | POA: Diagnosis not present

## 2023-08-04 DIAGNOSIS — H9313 Tinnitus, bilateral: Secondary | ICD-10-CM | POA: Diagnosis not present

## 2023-08-04 DIAGNOSIS — M26622 Arthralgia of left temporomandibular joint: Secondary | ICD-10-CM | POA: Diagnosis not present

## 2023-08-21 DIAGNOSIS — R609 Edema, unspecified: Secondary | ICD-10-CM | POA: Diagnosis not present

## 2023-08-21 DIAGNOSIS — I10 Essential (primary) hypertension: Secondary | ICD-10-CM | POA: Diagnosis not present

## 2023-08-21 DIAGNOSIS — R002 Palpitations: Secondary | ICD-10-CM | POA: Diagnosis not present

## 2023-08-23 ENCOUNTER — Telehealth: Payer: Self-pay | Admitting: Podiatry

## 2023-08-23 DIAGNOSIS — G4733 Obstructive sleep apnea (adult) (pediatric): Secondary | ICD-10-CM | POA: Diagnosis not present

## 2023-08-23 NOTE — Telephone Encounter (Signed)
 Req'ing refill on: oxyCODONE -acetaminophen  (PERCOCET/ROXICET) 5-325 MG tablet   Also:  The healed suture line turned purple after prolonged time on the foot, which the patient noticed recently. This discoloration occurred only once that she have noticed.  Currently, the area has returned to a normal pink color. Please advise if to be concerned.

## 2023-08-24 ENCOUNTER — Other Ambulatory Visit: Payer: Self-pay | Admitting: Podiatry

## 2023-08-24 MED ORDER — OXYCODONE-ACETAMINOPHEN 5-325 MG PO TABS: 1.0000 | ORAL_TABLET | Freq: Four times a day (QID) | ORAL | 0 refills | Status: DC | PRN

## 2023-08-24 NOTE — Telephone Encounter (Signed)
 Good morning,  I attempted to reach patient twice, left a voicemail and sent MyChart message to relay your message. Thank you.

## 2023-09-08 DIAGNOSIS — H402212 Chronic angle-closure glaucoma, right eye, moderate stage: Secondary | ICD-10-CM | POA: Diagnosis not present

## 2023-09-08 DIAGNOSIS — Z961 Presence of intraocular lens: Secondary | ICD-10-CM | POA: Diagnosis not present

## 2023-09-08 DIAGNOSIS — H04123 Dry eye syndrome of bilateral lacrimal glands: Secondary | ICD-10-CM | POA: Diagnosis not present

## 2023-09-08 DIAGNOSIS — H40032 Anatomical narrow angle, left eye: Secondary | ICD-10-CM | POA: Diagnosis not present

## 2023-09-11 ENCOUNTER — Other Ambulatory Visit: Payer: Self-pay | Admitting: Podiatry

## 2023-09-11 ENCOUNTER — Telehealth: Payer: Self-pay | Admitting: Podiatry

## 2023-09-11 MED ORDER — OXYCODONE-ACETAMINOPHEN 5-325 MG PO TABS: 1.0000 | ORAL_TABLET | Freq: Four times a day (QID) | ORAL | 0 refills | Status: DC | PRN

## 2023-09-11 NOTE — Telephone Encounter (Signed)
 Patient called requesting a refill of pain medication. [facility] [address]

## 2023-09-21 DIAGNOSIS — D6869 Other thrombophilia: Secondary | ICD-10-CM | POA: Diagnosis not present

## 2023-09-21 DIAGNOSIS — K219 Gastro-esophageal reflux disease without esophagitis: Secondary | ICD-10-CM | POA: Diagnosis not present

## 2023-09-22 DIAGNOSIS — E782 Mixed hyperlipidemia: Secondary | ICD-10-CM | POA: Diagnosis not present

## 2023-09-22 DIAGNOSIS — R262 Difficulty in walking, not elsewhere classified: Secondary | ICD-10-CM | POA: Diagnosis not present

## 2023-09-22 DIAGNOSIS — I480 Paroxysmal atrial fibrillation: Secondary | ICD-10-CM | POA: Diagnosis not present

## 2023-09-22 DIAGNOSIS — I10 Essential (primary) hypertension: Secondary | ICD-10-CM | POA: Diagnosis not present

## 2023-09-27 ENCOUNTER — Telehealth: Payer: Self-pay | Admitting: Podiatry

## 2023-09-27 NOTE — Telephone Encounter (Signed)
 Patient is requesting refills for Oxycodone -acetamionphen (Percocet/Roxicet) 5-325 mg tablet

## 2023-09-28 ENCOUNTER — Other Ambulatory Visit: Payer: Self-pay | Admitting: Podiatry

## 2023-09-28 MED ORDER — OXYCODONE-ACETAMINOPHEN 5-325 MG PO TABS: 1.0000 | ORAL_TABLET | Freq: Four times a day (QID) | ORAL | 0 refills | Status: DC | PRN

## 2023-10-20 ENCOUNTER — Other Ambulatory Visit: Payer: Self-pay | Admitting: Podiatry

## 2023-10-20 ENCOUNTER — Telehealth: Payer: Self-pay | Admitting: Lab

## 2023-10-20 MED ORDER — OXYCODONE-ACETAMINOPHEN 5-325 MG PO TABS: 1.0000 | ORAL_TABLET | Freq: Four times a day (QID) | ORAL | 0 refills | Status: DC | PRN

## 2023-10-20 NOTE — Progress Notes (Signed)
 Refill sent.

## 2023-10-20 NOTE — Telephone Encounter (Signed)
Patient is requesting pain medication.

## 2023-10-24 DIAGNOSIS — E063 Autoimmune thyroiditis: Secondary | ICD-10-CM | POA: Diagnosis not present

## 2023-10-24 DIAGNOSIS — I480 Paroxysmal atrial fibrillation: Secondary | ICD-10-CM | POA: Diagnosis not present

## 2023-10-24 DIAGNOSIS — I10 Essential (primary) hypertension: Secondary | ICD-10-CM | POA: Diagnosis not present

## 2023-10-24 DIAGNOSIS — E7800 Pure hypercholesterolemia, unspecified: Secondary | ICD-10-CM | POA: Diagnosis not present

## 2023-10-24 DIAGNOSIS — K219 Gastro-esophageal reflux disease without esophagitis: Secondary | ICD-10-CM | POA: Diagnosis not present

## 2023-10-24 DIAGNOSIS — M79671 Pain in right foot: Secondary | ICD-10-CM | POA: Diagnosis not present

## 2023-10-24 DIAGNOSIS — M79674 Pain in right toe(s): Secondary | ICD-10-CM | POA: Diagnosis not present

## 2023-10-24 DIAGNOSIS — Z86718 Personal history of other venous thrombosis and embolism: Secondary | ICD-10-CM | POA: Diagnosis not present

## 2023-11-01 DIAGNOSIS — Z20822 Contact with and (suspected) exposure to covid-19: Secondary | ICD-10-CM | POA: Diagnosis not present

## 2023-11-01 DIAGNOSIS — J029 Acute pharyngitis, unspecified: Secondary | ICD-10-CM | POA: Diagnosis not present

## 2023-11-01 DIAGNOSIS — R509 Fever, unspecified: Secondary | ICD-10-CM | POA: Diagnosis not present

## 2023-11-01 DIAGNOSIS — B349 Viral infection, unspecified: Secondary | ICD-10-CM | POA: Diagnosis not present

## 2023-11-08 ENCOUNTER — Other Ambulatory Visit: Payer: Self-pay | Admitting: Podiatry

## 2023-11-09 ENCOUNTER — Telehealth: Payer: Self-pay | Admitting: Lab

## 2023-11-09 ENCOUNTER — Other Ambulatory Visit: Payer: Self-pay | Admitting: Podiatry

## 2023-11-09 MED ORDER — OXYCODONE-ACETAMINOPHEN 5-325 MG PO TABS: 1.0000 | ORAL_TABLET | Freq: Four times a day (QID) | ORAL | 0 refills | Status: AC | PRN

## 2023-11-09 NOTE — Telephone Encounter (Signed)
 Not a problem and thank you.

## 2023-11-09 NOTE — Telephone Encounter (Signed)
 Returned call to patient. She is requesting refill on Oxycodone -Acetaminophen  5-325 mg. She is trying to taper off, but has some severe breakthrough pain shooting from her right heel into calf. This is the only time she takes the Percocet. She is utilizing elevation and a warm heel pack. She has not used the warm heel pack in awhile though. Has not really tried icing because she has a difficult time conforming an ice pack to the heel. Pain level is a 10/10 when it is at the maximum.   Prescription requested:  oxyCODONE -acetaminophen  (PERCOCET/ROXICET) 5-325 MG tablet 20 tablet 0 10/20/2023 --  Sig:   Take 1 tablet by mouth every 6 (six) hours as needed for severe pain (pain score 7-10).    Route:   Oral    PRN Reason(s):   severe pain (pain score 7-10)    Earliest Fill Date:   10/20/2023     Pharmacy: [facility] on [address]

## 2023-11-09 NOTE — Telephone Encounter (Signed)
 Did you get medication name and dosage from patient?

## 2023-11-09 NOTE — Telephone Encounter (Signed)
Patient requesting pain medication refill

## 2023-11-10 DIAGNOSIS — H10412 Chronic giant papillary conjunctivitis, left eye: Secondary | ICD-10-CM | POA: Diagnosis not present

## 2023-11-10 DIAGNOSIS — H2000 Unspecified acute and subacute iridocyclitis: Secondary | ICD-10-CM | POA: Diagnosis not present

## 2023-11-14 ENCOUNTER — Other Ambulatory Visit: Payer: Self-pay | Admitting: Nurse Practitioner

## 2023-11-14 DIAGNOSIS — K219 Gastro-esophageal reflux disease without esophagitis: Secondary | ICD-10-CM | POA: Diagnosis not present

## 2023-11-14 DIAGNOSIS — R109 Unspecified abdominal pain: Secondary | ICD-10-CM | POA: Diagnosis not present

## 2023-11-14 DIAGNOSIS — Z7901 Long term (current) use of anticoagulants: Secondary | ICD-10-CM | POA: Diagnosis not present

## 2023-11-14 DIAGNOSIS — R1310 Dysphagia, unspecified: Secondary | ICD-10-CM | POA: Diagnosis not present

## 2023-11-14 DIAGNOSIS — I4891 Unspecified atrial fibrillation: Secondary | ICD-10-CM | POA: Diagnosis not present

## 2023-11-17 DIAGNOSIS — H04123 Dry eye syndrome of bilateral lacrimal glands: Secondary | ICD-10-CM | POA: Diagnosis not present

## 2023-11-21 ENCOUNTER — Ambulatory Visit (INDEPENDENT_AMBULATORY_CARE_PROVIDER_SITE_OTHER): Admitting: Podiatry

## 2023-11-21 DIAGNOSIS — S86011D Strain of right Achilles tendon, subsequent encounter: Secondary | ICD-10-CM

## 2023-11-21 DIAGNOSIS — M898X7 Other specified disorders of bone, ankle and foot: Secondary | ICD-10-CM

## 2023-11-21 DIAGNOSIS — M19071 Primary osteoarthritis, right ankle and foot: Secondary | ICD-10-CM

## 2023-11-21 NOTE — Progress Notes (Signed)
  Subjective:  Patient ID: Tamara Morgan, female    DOB: 1955/06/22,  MRN: [id number]   DOS: 02/10/2023 Procedure: 1.  FHL tendon transfer to posterior calcaneus, right ankle 2.  Repair of Achilles tendon rupture, right ankle  68 y.o. female seen for post op check.  Patient seen about 10 weeks postop.  She is reporting improvement overall with decreased pain.  She is doing well at this time in regards to the Achilles and is able to walk.  She reports that she had a lot of improvement after steroid injection in the right ankle at prior visit and is hoping to get another one today if possible.  Had an x-ray done outpatient that showed some arthritis in the rear foot.  Review of Systems: Negative except as noted in the HPI. Denies N/V/F/Ch.   Objective:   There were no vitals filed for this visit.  There is no height or weight on file to calculate BMI. Constitutional Well developed. Well nourished.  Vascular Foot warm and well perfused. Capillary refill normal to all digits.   No calf pain with palpation  Neurologic Normal speech. Oriented to person, place, and time. Epicritic sensation intact to toes  Dermatologic No open wound present on right heel, fully healed from prior  Orthopedic: Wiggles toes and sensation intact to all toes.  Absent hallux plantarflexion as expected.  Able to dorsiflex the ankle to neutral from its resting position and plantarflexion however does not have much extra dorsiflexion past 90 degrees.  Plantarflexion and dorsiflexion strength are within normal limits for her procedures, 4+/5.  Patient does have pain with palpation of the subtalar joint especially laterally just anterior to the lateral malleolus   Radiographs: Deferred  Pathology: N/A  Micro: N/A  Assessment:   Right Achilles tendon rupture status post prior retrocalcaneal exostectomy with secondary repair of Achilles now status post revision tendon repair and FHL tendon transfer  Plan:   Patient was evaluated and treated and all questions answered.   s/p FHL tendon transfer and right Achilles tendon rupture repair -Overall continuing to improve with increased range of motion, decreasing pain and now to a manageable level - Continue home physical therapy regimen that she has been doing.  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  Pain has decreased manageable level  continue PT as needed for pain  # Subtalar joint arthritis - Patient reports significant improvement after prior injection -We discussed repeat injection and she would like to proceed with this as she had good improvement after the first injection - Recommend steroid injection and patient was agreeable.  After sterile prep injected 1 cc half percent Marcaine  plain with 1 cc Kenalog 10 into the subtalar joint from the sinus tarsi portal.  Patient tolerated well and bandage was applied. - Continue above therapies and pain control as needed continue ankle brace and good supportive shoe gear to limit inversion eversion range of motion when walking.         Tamara Morgan, DPM [facility]

## 2023-11-24 ENCOUNTER — Ambulatory Visit
Admission: RE | Admit: 2023-11-24 | Discharge: 2023-11-24 | Disposition: A | Discharge status: Home / Self Care | Source: Ambulatory Visit | Attending: Nurse Practitioner | Admitting: Nurse Practitioner

## 2023-11-24 DIAGNOSIS — K760 Fatty (change of) liver, not elsewhere classified: Secondary | ICD-10-CM | POA: Diagnosis not present

## 2023-11-24 DIAGNOSIS — R109 Unspecified abdominal pain: Secondary | ICD-10-CM

## 2023-11-24 DIAGNOSIS — K8020 Calculus of gallbladder without cholecystitis without obstruction: Secondary | ICD-10-CM | POA: Diagnosis not present

## 2023-11-30 DIAGNOSIS — R590 Localized enlarged lymph nodes: Secondary | ICD-10-CM | POA: Diagnosis not present

## 2023-12-04 ENCOUNTER — Telehealth: Payer: Self-pay | Admitting: Lab

## 2023-12-04 NOTE — Telephone Encounter (Signed)
 Patient states would like for the antiinflammatory called in as long as it is tylenol  based as discussed last visit.

## 2023-12-05 DIAGNOSIS — H52203 Unspecified astigmatism, bilateral: Secondary | ICD-10-CM | POA: Diagnosis not present

## 2023-12-11 DIAGNOSIS — R1310 Dysphagia, unspecified: Secondary | ICD-10-CM | POA: Diagnosis not present

## 2023-12-11 DIAGNOSIS — K648 Other hemorrhoids: Secondary | ICD-10-CM | POA: Diagnosis not present

## 2023-12-11 DIAGNOSIS — K224 Dyskinesia of esophagus: Secondary | ICD-10-CM | POA: Diagnosis not present

## 2023-12-11 DIAGNOSIS — K2930 Chronic superficial gastritis without bleeding: Secondary | ICD-10-CM | POA: Diagnosis not present

## 2023-12-11 DIAGNOSIS — K2950 Unspecified chronic gastritis without bleeding: Secondary | ICD-10-CM | POA: Diagnosis not present

## 2023-12-11 DIAGNOSIS — D120 Benign neoplasm of cecum: Secondary | ICD-10-CM | POA: Diagnosis not present

## 2023-12-11 DIAGNOSIS — K449 Diaphragmatic hernia without obstruction or gangrene: Secondary | ICD-10-CM | POA: Diagnosis not present

## 2023-12-11 DIAGNOSIS — K317 Polyp of stomach and duodenum: Secondary | ICD-10-CM | POA: Diagnosis not present

## 2023-12-11 DIAGNOSIS — K5730 Diverticulosis of large intestine without perforation or abscess without bleeding: Secondary | ICD-10-CM | POA: Diagnosis not present

## 2023-12-11 DIAGNOSIS — Z1211 Encounter for screening for malignant neoplasm of colon: Secondary | ICD-10-CM | POA: Diagnosis not present

## 2023-12-11 DIAGNOSIS — D124 Benign neoplasm of descending colon: Secondary | ICD-10-CM | POA: Diagnosis not present

## 2023-12-11 DIAGNOSIS — D123 Benign neoplasm of transverse colon: Secondary | ICD-10-CM | POA: Diagnosis not present

## 2023-12-13 DIAGNOSIS — K2930 Chronic superficial gastritis without bleeding: Secondary | ICD-10-CM | POA: Diagnosis not present

## 2023-12-13 DIAGNOSIS — D120 Benign neoplasm of cecum: Secondary | ICD-10-CM | POA: Diagnosis not present

## 2023-12-13 DIAGNOSIS — K317 Polyp of stomach and duodenum: Secondary | ICD-10-CM | POA: Diagnosis not present

## 2023-12-13 DIAGNOSIS — D124 Benign neoplasm of descending colon: Secondary | ICD-10-CM | POA: Diagnosis not present

## 2023-12-14 ENCOUNTER — Ambulatory Visit: Payer: Self-pay | Admitting: General Surgery

## 2023-12-14 DIAGNOSIS — K8020 Calculus of gallbladder without cholecystitis without obstruction: Secondary | ICD-10-CM

## 2023-12-18 ENCOUNTER — Encounter: Payer: Self-pay | Admitting: Radiology

## 2023-12-21 DIAGNOSIS — K080 Exfoliation of teeth due to systemic causes: Secondary | ICD-10-CM | POA: Diagnosis not present

## 2023-12-25 DIAGNOSIS — H16223 Keratoconjunctivitis sicca, not specified as Sjogren's, bilateral: Secondary | ICD-10-CM | POA: Diagnosis not present

## 2023-12-25 DIAGNOSIS — H02834 Dermatochalasis of left upper eyelid: Secondary | ICD-10-CM | POA: Diagnosis not present

## 2023-12-25 DIAGNOSIS — H02831 Dermatochalasis of right upper eyelid: Secondary | ICD-10-CM | POA: Diagnosis not present

## 2023-12-25 DIAGNOSIS — H04123 Dry eye syndrome of bilateral lacrimal glands: Secondary | ICD-10-CM | POA: Diagnosis not present

## 2024-01-17 ENCOUNTER — Encounter (HOSPITAL_COMMUNITY): Payer: Self-pay

## 2024-01-17 NOTE — Patient Instructions (Addendum)
 SURGICAL WAITING ROOM VISITATION Patients having surgery or a procedure may have no more than 2 support people in the waiting area - these visitors may rotate.    Children under the age of 58 must have an adult with them who is not the patient.  If the patient needs to stay at the hospital during part of their recovery, the visitor guidelines for inpatient rooms apply. Pre-op nurse will coordinate an appropriate time for 1 support person to accompany patient in pre-op.  This support person may not rotate.    Please refer to the [facility] website for the visitor guidelines for Inpatients (after your surgery is over and you are in a regular room).       Your procedure is scheduled on: 01-30-24   Report to [facility] Main Entrance    Report to admitting at 5:15 AM   Call this number if you have problems the morning of surgery [phone]   Do not eat food :After Midnight.   After Midnight you may have the following liquids until 4:30 AM DAY OF SURGERY  Water Non-Citrus Juices (without pulp, NO RED-Apple, White grape, White cranberry) Black Coffee (NO MILK/CREAM OR CREAMERS, sugar ok)  Clear Tea (NO MILK/CREAM OR CREAMERS, sugar ok) regular and decaf                             Plain Jell-O (NO RED)                                           Fruit ices (not with fruit pulp, NO RED)                                     Popsicles (NO RED)                                                               Sports drinks like Gatorade (NO RED)                       If you have questions, please contact your surgeon's office.   FOLLOW  ANY ADDITIONAL PRE OP INSTRUCTIONS YOU RECEIVED FROM YOUR SURGEON'S OFFICE!!!     Oral Hygiene is also important to reduce your risk of infection.                                    Remember - BRUSH YOUR TEETH THE MORNING OF SURGERY WITH YOUR REGULAR TOOTHPASTE   Do NOT smoke after Midnight   Take these medicines the morning of surgery with A SIP  OF WATER:    Pregabalin    Zyrtec   Tylenol  if needed   Okay to use inhalers, nasal spray and eyedrops  Stop all vitamins and herbal supplements 7 days before surgery  Hold Elquis  Bring CPAP mask and tubing day of surgery.  You may not have any metal on your body including hair pins, jewelry, and body piercing             Do not wear make-up, lotions, powders, perfumes, or deodorant  Do not wear nail polish including gel and S&S, artificial/acrylic nails, or any other type of covering on natural nails including finger and toenails. If you have artificial nails, gel coating, etc. that needs to be removed by a nail salon please have this removed prior to surgery or surgery may need to be canceled/ delayed if the surgeon/ anesthesia feels like they are unable to be safely monitored.   Do not shave  48 hours prior to surgery.             Do not bring valuables to the hospital. [facility] IS NOT RESPONSIBLE   FOR VALUABLES.   Contacts, dentures or bridgework may not be worn into surgery.  DO NOT BRING YOUR HOME MEDICATIONS TO THE HOSPITAL. PHARMACY WILL DISPENSE MEDICATIONS LISTED ON YOUR MEDICATION LIST TO YOU DURING YOUR ADMISSION IN THE HOSPITAL!    Patients discharged on the day of surgery will not be allowed to drive home.  Someone NEEDS to stay with you for the first 24 hours after anesthesia.   Special Instructions: Bring a copy of your healthcare power of attorney and living will documents the day of surgery if you haven't scanned them before.              Please read over the following fact sheets you were given: IF YOU HAVE QUESTIONS ABOUT YOUR PRE-OP INSTRUCTIONS PLEASE CALL [phone] [facility]  If you received a COVID test during your pre-op visit  it is requested that you wear a mask when out in public, stay away from anyone that may not be feeling well and notify your surgeon if you develop symptoms. If you test positive for Covid or have been in  contact with anyone that has tested positive in the last 10 days please notify you surgeon.  [facility] - Preparing for Surgery Before surgery, you can play an important role.  Because skin is not sterile, your skin needs to be as free of germs as possible.  You can reduce the number of germs on your skin by washing with CHG (chlorahexidine gluconate) soap before surgery.  CHG is an antiseptic cleaner which kills germs and bonds with the skin to continue killing germs even after washing. Please DO NOT use if you have an allergy to CHG or antibacterial soaps.  If your skin becomes reddened/irritated stop using the CHG and inform your nurse when you arrive at Short Stay. Do not shave (including legs and underarms) for at least 48 hours prior to the first CHG shower.  You may shave your face/neck.  Please follow these instructions carefully:  1.  Shower with CHG Soap the night before surgery ONLY (DO NOT USE THE SOAP THE MORNING OF SURGERY).  2.  If you choose to wash your hair, wash your hair first as usual with your normal  shampoo.  3.  After you shampoo, rinse your hair and body thoroughly to remove the shampoo.                             4.  Use CHG as you would any other liquid soap.  You can apply chg directly to the skin and wash.  Gently with a scrungie or clean washcloth.  5.  Apply the CHG Soap to your body ONLY FROM THE NECK DOWN.   Do   not use on face/ open                           Wound or open sores. Avoid contact with eyes, ears mouth and   genitals (private parts).                       Wash face,  Genitals (private parts) with your normal soap.             6.  Wash thoroughly, paying special attention to the area where your    surgery  will be performed.  7.  Thoroughly rinse your body with warm water from the neck down.  8.  DO NOT shower/wash with your normal soap after using and rinsing off the CHG Soap.                9.  Pat yourself dry with a clean towel.            10.   Wear clean pajamas.            11.  Place clean sheets on your bed the night of your first shower and do not  sleep with pets. Day of Surgery :  Shower with regular soap Do not apply any CHG, lotions/deodorants the morning of surgery.  Please wear clean clothes to the hospital/surgery center.  FAILURE TO FOLLOW THESE INSTRUCTIONS MAY RESULT IN THE CANCELLATION OF YOUR SURGERY  PATIENT SIGNATURE_________________________________  NURSE SIGNATURE__________________________________  ________________________________________________________________________

## 2024-01-17 NOTE — Progress Notes (Addendum)
 Date of COVID positive in last 90 days:  No  PCP - Bernardino Boone, DO Cardiologist - Youlanda Juba, MD Neurologist - [employer] (for OSA)  Chest x-ray - N/A EKG - 02-27-23 Epic, 07-26-23 (copy in media) Stress Test -08-09-22 [facility] ECHO - 07-13-22 [facility] Cardiac Cath - Yes Pacemaker/ICD device last checked:N/A Spinal Cord Stimulator:N/A  Bowel Prep - N/A  Sleep Study - Yes, +sleep apnea CPAP - Yes  Fasting Blood Sugar - N/A Checks Blood Sugar _____ times a day  Last dose of GLP1 agonist-  N/A GLP1 instructions:  Do not take after     Last dose of SGLT-2 inhibitors-  N/A SGLT-2 instructions:  Do not take after    Blood Thinner Instructions: Eliquis  (patient will call cardiology to see when to hold, has not been given instructions).  Took last dose on Friday night (01-26-24)  Time: Aspirin Instructions:N/A Last Dose:  Activity level:  Can go up a flight of stairs and perform activities of daily living without stopping and without symptoms of chest pain or shortness of breath.  Anesthesia review: Afib. Cough that developed 01-17-24, no other associated symptoms.  Patient had nonproductive cough during preop.  I advised patient to notify surgeon if symptoms worsened.   Patient denies shortness of breath, fever, and chest pain at PAT appointment  Patient verbalized understanding of instructions that were given to them at the PAT appointment. Patient was also instructed that they will need to review over the PAT instructions again at home before surgery.

## 2024-01-18 ENCOUNTER — Encounter (HOSPITAL_COMMUNITY)
Admission: RE | Admit: 2024-01-18 | Discharge: 2024-01-18 | Disposition: A | Source: Ambulatory Visit | Attending: General Surgery

## 2024-01-18 ENCOUNTER — Encounter (HOSPITAL_COMMUNITY): Payer: Self-pay

## 2024-01-18 ENCOUNTER — Other Ambulatory Visit: Payer: Self-pay

## 2024-01-18 VITALS — BP 158/88 | HR 61 | Temp 98.5°F | Resp 16 | Ht 68.0 in | Wt 267.6 lb

## 2024-01-18 DIAGNOSIS — H16223 Keratoconjunctivitis sicca, not specified as Sjogren's, bilateral: Secondary | ICD-10-CM | POA: Diagnosis not present

## 2024-01-18 DIAGNOSIS — Z01812 Encounter for preprocedural laboratory examination: Secondary | ICD-10-CM | POA: Diagnosis not present

## 2024-01-18 DIAGNOSIS — G4733 Obstructive sleep apnea (adult) (pediatric): Secondary | ICD-10-CM | POA: Diagnosis not present

## 2024-01-18 DIAGNOSIS — I480 Paroxysmal atrial fibrillation: Secondary | ICD-10-CM | POA: Diagnosis not present

## 2024-01-18 DIAGNOSIS — Z01818 Encounter for other preprocedural examination: Secondary | ICD-10-CM

## 2024-01-18 DIAGNOSIS — K8020 Calculus of gallbladder without cholecystitis without obstruction: Secondary | ICD-10-CM

## 2024-01-18 DIAGNOSIS — K219 Gastro-esophageal reflux disease without esophagitis: Secondary | ICD-10-CM | POA: Diagnosis not present

## 2024-01-18 DIAGNOSIS — I10 Essential (primary) hypertension: Secondary | ICD-10-CM | POA: Diagnosis not present

## 2024-01-18 HISTORY — DX: Unspecified osteoarthritis, unspecified site: M19.90

## 2024-01-18 HISTORY — DX: Inflammatory liver disease, unspecified: K75.9

## 2024-01-18 HISTORY — DX: Headache, unspecified: R51.9

## 2024-01-18 HISTORY — DX: Respiratory tuberculosis unspecified: A15.9

## 2024-01-18 HISTORY — DX: Anxiety disorder, unspecified: F41.9

## 2024-01-18 HISTORY — DX: Unspecified atrial fibrillation: I48.91

## 2024-01-18 HISTORY — DX: Unspecified asthma, uncomplicated: J45.909

## 2024-01-18 HISTORY — DX: Anemia, unspecified: D64.9

## 2024-01-18 HISTORY — DX: Depression, unspecified: F32.A

## 2024-01-18 HISTORY — DX: Pneumonia, unspecified organism: J18.9

## 2024-01-18 LAB — COMPREHENSIVE METABOLIC PANEL WITH GFR
ALT: 33 U/L (ref 0–44)
AST: 33 U/L (ref 15–41)
Albumin: 4.3 g/dL (ref 3.5–5.0)
Alkaline Phosphatase: 90 U/L (ref 38–126)
Anion gap: 10 (ref 5–15)
BUN: 13 mg/dL (ref 8–23)
CO2: 25 mmol/L (ref 22–32)
Calcium: 10.4 mg/dL — ABNORMAL HIGH (ref 8.9–10.3)
Chloride: 104 mmol/L (ref 98–111)
Creatinine, Ser: 0.78 mg/dL (ref 0.44–1.00)
GFR, Estimated: >60 mL/min (ref >60)
Glucose, Bld: 130 mg/dL — ABNORMAL HIGH (ref 70–99)
Potassium: 4.1 mmol/L (ref 3.5–5.1)
Sodium: 139 mmol/L (ref 135–145)
Total Bilirubin: 0.4 mg/dL (ref 0.0–1.2)
Total Protein: 7.5 g/dL (ref 6.5–8.1)

## 2024-01-18 LAB — CBC
HCT: 41.3 % (ref 36.0–46.0)
Hemoglobin: 13.3 g/dL (ref 12.0–15.0)
MCH: 28.2 pg (ref 26.0–34.0)
MCHC: 32.2 g/dL (ref 30.0–36.0)
MCV: 87.7 fL (ref 80.0–100.0)
Platelets: 287 K/uL (ref 150–400)
RBC: 4.71 MIL/uL (ref 3.87–5.11)
RDW: 13.6 % (ref 11.5–15.5)
WBC: 6.5 K/uL (ref 4.0–10.5)
nRBC: 0 % (ref 0.0–0.2)

## 2024-01-22 NOTE — Progress Notes (Signed)
 Anesthesia Chart Review   Case: [id number] Date/Time: 01/30/24 0715   Procedure: LAPAROSCOPIC CHOLECYSTECTOMY WITH INTRAOPERATIVE CHOLANGIOGRAM - LAP CHOLE W/POSSIBLE IOC   Anesthesia type: General   Pre-op diagnosis: gallstones   Location: [facility] ROOM 04 / [facility]   Surgeons: Curvin Deward MOULD, MD       DISCUSSION:68 y.o. never smoker with h/o OSA uses CPAP, HTN, GERD, paroxysmal a-fib, gallstones scheduled for above procedure 01/30/2024 with Dr. Deward Curvin.   Difficult intubation.  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 Short thick neck. Positioned pt with shoulder roll (2 rolled blankets) and head on foam sq pillow plus one regular bed pillow for good sniffing position. Easy mask aw without OA. Eyes taped closed prior to DL. DL with ant grade 2 view even using McGrath videoscope. Required suction due to copious clear oral frothy secretions. ETT placed after sharp J curve of styletted ETT. Adhesive goggles placed over taped closed eyes. Open face prone foam sq pillow placed over face.  Echo from May 2024 with normal LV systolic function and no significant valvular disease.  Nuclear stress test from June 2024 was negative for ischemia with normal LV systolic function and normal LV wall motion.  Pt last seen by cardiology June of this year, stable at this visit with no changes made. Pt reports to PAT nurse she can climb a flight of stairs without chest pain or shortness of breath.   VS: BP (!) 158/88   Pulse 61   Temp 36.9 C (Oral)   Resp 16   Ht 5' 8 (1.727 m)   Wt 121.4 kg   SpO2 99%   BMI 40.69 kg/m   PROVIDERS: Dayna Motto, DO is PCP  Fredrica Lipoma, MD is Cardiologist  LABS: Labs reviewed: Acceptable for surgery. (all labs ordered are listed, but only abnormal results are displayed)  Labs Reviewed  COMPREHENSIVE METABOLIC PANEL WITH GFR - Abnormal; Notable for the following components:      Result Value   Glucose, Bld  130 (*)    Calcium 10.4 (*)    All other components within normal limits  CBC     IMAGES:   EKG:   CV: Stress Test 08/09/2022 (Care Everywhere) IMPRESSION:  - No evidence of inducible ischemia.  -Large size mild to moderate intensity anteroapical and anteroseptal fixed defect likely secondary to significant breast attenuation and considering normal contractility of all LV wall segments, less likely secondary to scar.  -Small size mild intensity basal anterolateral and mid anterolateral fixed defect more likely secondary to breast attenuation and considering normal contractility and thickening of lateral segments, less likely secondary to small scar.  - Prognostically this is probably a low risk scan. Clinical correlation is recommended.  - Breast attenuation and diaphragmatic attenuation noted at rest and after Lexiscan  infusion and could mildly decrease specificity of this study and could decrease specificity of this study. Mild motion artifact noted after Lexiscan  infusion.  - Left ventricular ejection fraction of 70%. Normal LV size. LV end-diastolic volume - 104 ml, end-systolic volume - 31 ml.  - Normal left ventricular systolic wall motion.   Echo 07/13/2022 (Care Everywhere) Left Ventricle: Systolic function is normal. EF: 65%. Quantitative  analysis of left ventricular Global Longitudinal Strain (GLS) imaging is  -22.900%, which is normal. Ejection fraction measured by 3D is 62%, which  is normal.    Left Ventricle: Left ventricle is borderline dilated.    Left Ventricle: Wall  motion is normal.    Left Ventricle: Doppler parameters indicate normal diastolic function.    Left Atrium: Left atrium is borderline dilated at 4.000 cm. Left atrium  volume index is normal (16-34 mL/m2).    Aorta: The aortic root is normal in size.  Aortic arcg is at upper  limit of normal measuring 3.15 cm.    Tricuspid Valve: The right ventricular systolic pressure is normal (<36  mmHg).  Past  Medical History:  Diagnosis Date   A-fib (HCC)    Anemia    Anxiety    Arrhythmia    Arthritis    Asthma    Benign tumor    Cardiac anomaly    Depression    GERD (gastroesophageal reflux disease)    Hashimoto's disease    Headache    Sinus   Hepatitis    History of bone density study    Hyperlipidemia    Hypertension    Lyme disease    Malaria    OSA (obstructive sleep apnea)    Pap smear, as part of routine gynecological examination 2022   Pneumonia    Tuberculosis    1980, inactive TB    Past Surgical History:  Procedure Laterality Date   ABDOMINAL HYSTERECTOMY     ACHILLES TENDON SURGERY Right 02/10/2023   Procedure: ACHILLES TENDON REPAIR;  Surgeon: Malvin Marsa FALCON, DPM;  Location: [facility] ORS;  Service: Orthopedics/Podiatry;  Laterality: Right;   CARDIAC CATHETERIZATION     COLONOSCOPY     several   ECTOPIC PREGNANCY SURGERY     ELBOW SURGERY     OOPHORECTOMY Left    RECTOCELE REPAIR     TENDON TRANSFER Right 02/10/2023   Procedure: RIGHT FOOT TENDON TRANSFER WITH TENDON GRAFT APPLICATION;  Surgeon: Malvin Marsa FALCON, DPM;  Location: [facility] ORS;  Service: Orthopedics/Podiatry;  Laterality: Right;  BLOCK   VAGINAL HYSTERECTOMY      MEDICATIONS:  acetaminophen  (TYLENOL ) 325 MG tablet   albuterol  (VENTOLIN  HFA) 108 (90 Base) MCG/ACT inhaler   apixaban  (ELIQUIS ) 5 MG TABS tablet   Azelastine HCl 137 MCG/SPRAY SOLN   Bacillus Coagulans-Inulin (PROBIOTIC-PREBIOTIC PO)   cetirizine (ZYRTEC) 10 MG tablet   chlorhexidine  (PERIDEX ) 0.12 % solution   fluticasone (FLONASE) 50 MCG/ACT nasal spray   guaiFENesin -dextromethorphan (ROBITUSSIN DM) 100-10 MG/5ML syrup   hydrocortisone  cream 0.5 %   losartan  (COZAAR ) 25 MG tablet   MAGNESIUM PO   metoprolol  succinate (TOPROL -XL) 25 MG 24 hr tablet   OVER THE COUNTER MEDICATION   OVER THE COUNTER MEDICATION   OVER THE COUNTER MEDICATION   OVER THE COUNTER MEDICATION   oxyCODONE -acetaminophen  (PERCOCET/ROXICET)  5-325 MG tablet   pantoprazole  (PROTONIX ) 40 MG tablet   Phenylephrine -Acetaminophen  (TYLENOL  SINUS CONGESTION/PAIN PO)   Polyethyl Glycol-Propyl Glycol (SYSTANE ULTRA OP)   Povidone, PF, (IVIZIA DRY EYES) 0.5 % SOLN   pregabalin  (LYRICA ) 75 MG capsule   Vitamin D-Vitamin K (K2-D3 MAX PO)   XIIDRA 5 % SOLN   No current facility-administered medications for this encounter.     Harlene Hoots Ward, PA-C [facility] Pre-Surgical Testing [phone]

## 2024-01-23 ENCOUNTER — Other Ambulatory Visit: Payer: Self-pay | Admitting: General Surgery

## 2024-01-23 DIAGNOSIS — R1031 Right lower quadrant pain: Secondary | ICD-10-CM

## 2024-01-24 DIAGNOSIS — H5789 Other specified disorders of eye and adnexa: Secondary | ICD-10-CM | POA: Diagnosis not present

## 2024-01-24 DIAGNOSIS — H5712 Ocular pain, left eye: Secondary | ICD-10-CM | POA: Diagnosis not present

## 2024-01-25 DIAGNOSIS — S0502XA Injury of conjunctiva and corneal abrasion without foreign body, left eye, initial encounter: Secondary | ICD-10-CM | POA: Diagnosis not present

## 2024-01-25 DIAGNOSIS — M25531 Pain in right wrist: Secondary | ICD-10-CM | POA: Diagnosis not present

## 2024-01-25 DIAGNOSIS — S63501A Unspecified sprain of right wrist, initial encounter: Secondary | ICD-10-CM | POA: Diagnosis not present

## 2024-01-26 ENCOUNTER — Ambulatory Visit
Admission: RE | Admit: 2024-01-26 | Discharge: 2024-01-26 | Disposition: A | Discharge status: Home / Self Care | Source: Ambulatory Visit | Attending: General Surgery | Admitting: General Surgery

## 2024-01-26 DIAGNOSIS — H16012 Central corneal ulcer, left eye: Secondary | ICD-10-CM | POA: Diagnosis not present

## 2024-01-26 DIAGNOSIS — R1031 Right lower quadrant pain: Secondary | ICD-10-CM

## 2024-01-26 DIAGNOSIS — H5712 Ocular pain, left eye: Secondary | ICD-10-CM | POA: Diagnosis not present

## 2024-01-30 ENCOUNTER — Ambulatory Visit (HOSPITAL_COMMUNITY)
Admission: RE | Admit: 2024-01-30 | Discharge: 2024-01-30 | Disposition: A | Attending: General Surgery | Admitting: General Surgery

## 2024-01-30 ENCOUNTER — Encounter (HOSPITAL_COMMUNITY): Admission: RE | Disposition: A | Payer: Self-pay | Source: Ambulatory Visit | Attending: General Surgery

## 2024-01-30 ENCOUNTER — Ambulatory Visit (HOSPITAL_COMMUNITY): Payer: Self-pay | Admitting: Medical

## 2024-01-30 ENCOUNTER — Ambulatory Visit (HOSPITAL_COMMUNITY): Admitting: Anesthesiology

## 2024-01-30 ENCOUNTER — Encounter (HOSPITAL_COMMUNITY): Payer: Self-pay | Admitting: General Surgery

## 2024-01-30 DIAGNOSIS — Z7901 Long term (current) use of anticoagulants: Secondary | ICD-10-CM | POA: Diagnosis not present

## 2024-01-30 DIAGNOSIS — Z79899 Other long term (current) drug therapy: Secondary | ICD-10-CM | POA: Insufficient documentation

## 2024-01-30 DIAGNOSIS — K219 Gastro-esophageal reflux disease without esophagitis: Secondary | ICD-10-CM | POA: Insufficient documentation

## 2024-01-30 DIAGNOSIS — K8020 Calculus of gallbladder without cholecystitis without obstruction: Secondary | ICD-10-CM | POA: Diagnosis not present

## 2024-01-30 DIAGNOSIS — I10 Essential (primary) hypertension: Secondary | ICD-10-CM | POA: Insufficient documentation

## 2024-01-30 DIAGNOSIS — R599 Enlarged lymph nodes, unspecified: Secondary | ICD-10-CM | POA: Diagnosis not present

## 2024-01-30 DIAGNOSIS — K8010 Calculus of gallbladder with chronic cholecystitis without obstruction: Secondary | ICD-10-CM | POA: Diagnosis not present

## 2024-01-30 DIAGNOSIS — I4891 Unspecified atrial fibrillation: Secondary | ICD-10-CM | POA: Diagnosis not present

## 2024-01-30 DIAGNOSIS — G4730 Sleep apnea, unspecified: Secondary | ICD-10-CM | POA: Diagnosis not present

## 2024-01-30 DIAGNOSIS — J45909 Unspecified asthma, uncomplicated: Secondary | ICD-10-CM | POA: Insufficient documentation

## 2024-01-30 DIAGNOSIS — G4733 Obstructive sleep apnea (adult) (pediatric): Secondary | ICD-10-CM | POA: Diagnosis not present

## 2024-01-30 HISTORY — PX: CHOLECYSTECTOMY: SHX55

## 2024-01-30 SURGERY — LAPAROSCOPIC CHOLECYSTECTOMY WITH INTRAOPERATIVE CHOLANGIOGRAM
Anesthesia: General | Site: Abdomen

## 2024-01-30 MED ORDER — BUPIVACAINE-EPINEPHRINE 0.25% -1:200000 IJ SOLN
INTRAMUSCULAR | Status: DC | PRN
  Administered 2024-01-30: 09:00:00 10 mL
  Administered 2024-01-30: 08:00:00 15 mL

## 2024-01-30 MED ORDER — PROPOFOL 10 MG/ML IV BOLUS
INTRAVENOUS | Status: DC | PRN
  Administered 2024-01-30: 08:00:00 40 mg via INTRAVENOUS
  Administered 2024-01-30: 08:00:00 160 mg via INTRAVENOUS

## 2024-01-30 MED ORDER — LABETALOL HCL 5 MG/ML IV SOLN
INTRAVENOUS | Status: AC
  Filled 2024-01-30: qty 4

## 2024-01-30 MED ORDER — GLYCOPYRROLATE 0.2 MG/ML IJ SOLN
INTRAMUSCULAR | Status: DC | PRN
  Administered 2024-01-30: 08:00:00 .2 mg via INTRAVENOUS

## 2024-01-30 MED ORDER — FENTANYL CITRATE (PF) 50 MCG/ML IJ SOSY
PREFILLED_SYRINGE | INTRAMUSCULAR | Status: AC
  Filled 2024-01-30: qty 2

## 2024-01-30 MED ORDER — CIPROFLOXACIN IN D5W 400 MG/200ML IV SOLN
400.0000 mg | INTRAVENOUS | Status: AC
  Administered 2024-01-30: 08:00:00 400 mg via INTRAVENOUS
  Filled 2024-01-30: qty 200

## 2024-01-30 MED ORDER — GABAPENTIN 100 MG PO CAPS
100.0000 mg | ORAL_CAPSULE | ORAL | Status: AC
  Administered 2024-01-30: 06:00:00 100 mg via ORAL
  Filled 2024-01-30: qty 1

## 2024-01-30 MED ORDER — DEXMEDETOMIDINE HCL IN NACL 80 MCG/20ML IV SOLN
INTRAVENOUS | Status: AC
  Filled 2024-01-30: qty 20

## 2024-01-30 MED ORDER — INDOCYANINE GREEN 25 MG IJ SOLR
1.2500 mg | Freq: Once | INTRAMUSCULAR | Status: AC
  Administered 2024-01-30: 08:00:00 2.5 mg via INTRAVENOUS

## 2024-01-30 MED ORDER — ONDANSETRON HCL 4 MG/2ML IJ SOLN
INTRAMUSCULAR | Status: AC
  Filled 2024-01-30: qty 2

## 2024-01-30 MED ORDER — DEXAMETHASONE SOD PHOSPHATE PF 10 MG/ML IJ SOLN
INTRAMUSCULAR | Status: DC | PRN
  Administered 2024-01-30: 08:00:00 4 mg via INTRAVENOUS

## 2024-01-30 MED ORDER — FENTANYL CITRATE (PF) 50 MCG/ML IJ SOSY
25.0000 ug | PREFILLED_SYRINGE | INTRAMUSCULAR | Status: DC | PRN
  Administered 2024-01-30 (×2): 25 ug via INTRAVENOUS
  Administered 2024-01-30: 09:00:00 50 ug via INTRAVENOUS
  Administered 2024-01-30 (×2): 25 ug via INTRAVENOUS

## 2024-01-30 MED ORDER — FENTANYL CITRATE (PF) 100 MCG/2ML IJ SOLN
INTRAMUSCULAR | Status: AC
  Filled 2024-01-30: qty 2

## 2024-01-30 MED ORDER — LIDOCAINE HCL (PF) 2 % IJ SOLN
INTRAMUSCULAR | Status: AC
  Filled 2024-01-30: qty 5

## 2024-01-30 MED ORDER — ONDANSETRON HCL 4 MG/2ML IJ SOLN
INTRAMUSCULAR | Status: DC | PRN
  Administered 2024-01-30: 09:00:00 4 mg via INTRAVENOUS

## 2024-01-30 MED ORDER — OXYCODONE HCL 5 MG PO TABS: 5.0000 mg | ORAL_TABLET | Freq: Four times a day (QID) | ORAL | 0 refills | Status: AC | PRN

## 2024-01-30 MED ORDER — OXYCODONE HCL 5 MG PO TABS
ORAL_TABLET | ORAL | Status: AC
  Filled 2024-01-30: qty 1

## 2024-01-30 MED ORDER — FENTANYL CITRATE (PF) 100 MCG/2ML IJ SOLN
INTRAMUSCULAR | Status: DC | PRN
  Administered 2024-01-30: 08:00:00 100 ug via INTRAVENOUS
  Administered 2024-01-30 (×2): 50 ug via INTRAVENOUS

## 2024-01-30 MED ORDER — CHLORHEXIDINE GLUCONATE 0.12 % MT SOLN
15.0000 mL | Freq: Once | OROMUCOSAL | Status: AC
  Administered 2024-01-30: 06:00:00 15 mL via OROMUCOSAL

## 2024-01-30 MED ORDER — CHLORHEXIDINE GLUCONATE CLOTH 2 % EX PADS: 6.0000 | MEDICATED_PAD | Freq: Once | CUTANEOUS | Status: DC

## 2024-01-30 MED ORDER — SUGAMMADEX SODIUM 200 MG/2ML IV SOLN
INTRAVENOUS | Status: DC | PRN
  Administered 2024-01-30: 09:00:00 200 mg via INTRAVENOUS
  Administered 2024-01-30 (×2): 100 mg via INTRAVENOUS

## 2024-01-30 MED ORDER — MIDAZOLAM HCL 2 MG/2ML IJ SOLN
INTRAMUSCULAR | Status: AC
  Filled 2024-01-30: qty 2

## 2024-01-30 MED ORDER — ACETAMINOPHEN 500 MG PO TABS
1000.0000 mg | ORAL_TABLET | ORAL | Status: AC
  Administered 2024-01-30: 06:00:00 1000 mg via ORAL
  Filled 2024-01-30: qty 2

## 2024-01-30 MED ORDER — ACETAMINOPHEN 10 MG/ML IV SOLN: 1000.0000 mg | Freq: Once | INTRAVENOUS | Status: DC | PRN

## 2024-01-30 MED ORDER — SUGAMMADEX SODIUM 200 MG/2ML IV SOLN
INTRAVENOUS | Status: AC
  Filled 2024-01-30: qty 2

## 2024-01-30 MED ORDER — BUPIVACAINE-EPINEPHRINE (PF) 0.25% -1:200000 IJ SOLN
INTRAMUSCULAR | Status: AC
  Filled 2024-01-30: qty 60

## 2024-01-30 MED ORDER — ROCURONIUM BROMIDE 10 MG/ML (PF) SYRINGE
PREFILLED_SYRINGE | INTRAVENOUS | Status: AC
  Filled 2024-01-30: qty 10

## 2024-01-30 MED ORDER — LIDOCAINE HCL (CARDIAC) PF 100 MG/5ML IV SOSY
PREFILLED_SYRINGE | INTRAVENOUS | Status: DC | PRN
  Administered 2024-01-30: 08:00:00 80 mg via INTRAVENOUS

## 2024-01-30 MED ORDER — OXYCODONE HCL 5 MG PO TABS
5.0000 mg | ORAL_TABLET | Freq: Once | ORAL | Status: AC | PRN
  Administered 2024-01-30: 10:00:00 5 mg via ORAL

## 2024-01-30 MED ORDER — PROPOFOL 10 MG/ML IV BOLUS
INTRAVENOUS | Status: AC
  Filled 2024-01-30: qty 20

## 2024-01-30 MED ORDER — SODIUM CHLORIDE 0.9 % IR SOLN
Status: DC | PRN
  Administered 2024-01-30: 08:00:00 1000 mL

## 2024-01-30 MED ORDER — LACTATED RINGERS IR SOLN
Status: DC | PRN
  Administered 2024-01-30: 09:00:00 1000 mL

## 2024-01-30 MED ORDER — DEXMEDETOMIDINE HCL IN NACL 80 MCG/20ML IV SOLN
INTRAVENOUS | Status: DC | PRN
  Administered 2024-01-30: 09:00:00 8 ug via INTRAVENOUS
  Administered 2024-01-30: 08:00:00 4 ug via INTRAVENOUS
  Administered 2024-01-30: 08:00:00 8 ug via INTRAVENOUS

## 2024-01-30 MED ORDER — LABETALOL HCL 5 MG/ML IV SOLN
INTRAVENOUS | Status: DC | PRN
  Administered 2024-01-30: 09:00:00 10 mg via INTRAVENOUS

## 2024-01-30 MED ORDER — OXYCODONE HCL 5 MG/5ML PO SOLN: 5.0000 mg | Freq: Once | ORAL | Status: AC | PRN

## 2024-01-30 MED ORDER — GLYCOPYRROLATE 0.2 MG/ML IJ SOLN
INTRAMUSCULAR | Status: AC
  Filled 2024-01-30: qty 1

## 2024-01-30 MED ORDER — FENTANYL CITRATE (PF) 50 MCG/ML IJ SOSY
PREFILLED_SYRINGE | INTRAMUSCULAR | Status: AC
  Filled 2024-01-30: qty 1

## 2024-01-30 MED ORDER — LACTATED RINGERS IV SOLN: INTRAVENOUS | Status: DC

## 2024-01-30 MED ORDER — ORAL CARE MOUTH RINSE: 15.0000 mL | Freq: Once | OROMUCOSAL | Status: AC

## 2024-01-30 MED ORDER — ROCURONIUM BROMIDE 10 MG/ML (PF) SYRINGE
PREFILLED_SYRINGE | INTRAVENOUS | Status: DC | PRN
  Administered 2024-01-30: 08:00:00 100 mg via INTRAVENOUS

## 2024-01-30 SURGICAL SUPPLY — 33 items
BAG COUNTER SPONGE SURGICOUNT (BAG) IMPLANT
CATH REDDICK CHOLANGI 4FR 50CM (CATHETERS) ×1 IMPLANT
CHLORAPREP W/TINT 26 (MISCELLANEOUS) ×1 IMPLANT
CLIP APPLIE 5 13 M/L LIGAMAX5 (MISCELLANEOUS) ×1 IMPLANT
CNTNR URN SCR LID CUP LEK RST (MISCELLANEOUS) IMPLANT
CORD HIGH FREQUENCY UNIPOLAR (ELECTROSURGICAL) IMPLANT
COVER MAYO STAND XLG (MISCELLANEOUS) ×1 IMPLANT
COVER SURGICAL LIGHT HANDLE (MISCELLANEOUS) ×1 IMPLANT
DERMABOND ADVANCED .7 DNX12 (GAUZE/BANDAGES/DRESSINGS) ×1 IMPLANT
DRAPE C-ARM 42X120 X-RAY (DRAPES) ×1 IMPLANT
ELECT REM PT RETURN 15FT ADLT (MISCELLANEOUS) ×1 IMPLANT
GLOVE BIO SURGEON STRL SZ7.5 (GLOVE) ×1 IMPLANT
GLOVE BIOGEL PI IND STRL 8.5 (GLOVE) IMPLANT
GLOVE SURG SYN 8.0 PF PI (GLOVE) IMPLANT
GOWN STRL REUS W/ TWL LRG LVL3 (GOWN DISPOSABLE) IMPLANT
HEMOSTAT SNOW SURGICEL 2X4 (HEMOSTASIS) IMPLANT
IRRIGATION SUCT STRKRFLW 2 WTP (MISCELLANEOUS) ×1 IMPLANT
IV CATH 14GX2 1/4 (CATHETERS) ×1 IMPLANT
KIT BASIN OR (CUSTOM PROCEDURE TRAY) ×1 IMPLANT
KIT IMAGING PINPOINTPAQ (MISCELLANEOUS) IMPLANT
KIT TURNOVER KIT A (KITS) ×1 IMPLANT
PENCIL SMOKE EVACUATOR (MISCELLANEOUS) IMPLANT
SCISSORS LAP 5X35 DISP (ENDOMECHANICALS) ×1 IMPLANT
SET TUBE SMOKE EVAC HIGH FLOW (TUBING) ×1 IMPLANT
SLEEVE Z-THREAD 5X100MM (TROCAR) ×2 IMPLANT
SPIKE FLUID TRANSFER (MISCELLANEOUS) ×1 IMPLANT
SUT MNCRL AB 4-0 PS2 18 (SUTURE) ×1 IMPLANT
SUT VICRYL 0 UR6 27IN ABS (SUTURE) ×2 IMPLANT
SYSTEM BAG RETRIEVAL 10MM (BASKET) ×1 IMPLANT
TOWEL OR DSP ST BLU DLX 10/PK (DISPOSABLE) ×1 IMPLANT
TRAY LAPAROSCOPIC (CUSTOM PROCEDURE TRAY) ×1 IMPLANT
TROCAR BALLN 12MMX100 BLUNT (TROCAR) ×1 IMPLANT
TROCAR Z-THREAD OPTICAL 5X100M (TROCAR) ×1 IMPLANT

## 2024-01-30 NOTE — Anesthesia Postprocedure Evaluation (Signed)
 Anesthesia Post Note  Patient: Tamara Morgan  Procedure(s) Performed: LAPAROSCOPIC CHOLECYSTECTOMY WITH INDOCYANINE GREEN  (Abdomen)     Patient location during evaluation: PACU Anesthesia Type: General Level of consciousness: awake and patient cooperative Pain management: pain level controlled Vital Signs Assessment: post-procedure vital signs reviewed and stable Respiratory status: spontaneous breathing, nonlabored ventilation and respiratory function stable Cardiovascular status: blood pressure returned to baseline and stable Postop Assessment: no apparent nausea or vomiting Anesthetic complications: no   No notable events documented.  Last Vitals:  Vitals:   01/30/24 0945 01/30/24 1000  BP: (!) 153/72 (!) 144/81  Pulse: (!) 52 (!) 54  Resp: (!) 7 15  Temp:    SpO2: 93% 97%    Last Pain:  Vitals:   01/30/24 1006  TempSrc:   PainSc: 8                  Lyzbeth Genrich

## 2024-01-30 NOTE — Op Note (Signed)
 01/30/2024  8:48 AM  PATIENT:  Tamara Morgan  68 y.o. female  PRE-OPERATIVE DIAGNOSIS:  gallstones  POST-OPERATIVE DIAGNOSIS:  gallstones  PROCEDURE:  Procedures with comments: LAPAROSCOPIC CHOLECYSTECTOMY WITH INDOCYANINE GREEN  (N/A) - LAP CHOLE W/ INDOCYANINE GREEN   SURGEON:  Surgeons and Role:    * Curvin Deward MOULD, MD - Primary  PHYSICIAN ASSISTANT:   ASSISTANTS: none   ANESTHESIA:   local and general  EBL:  5 mL   BLOOD ADMINISTERED:none  DRAINS: none   LOCAL MEDICATIONS USED:  MARCAINE      SPECIMEN:  Source of Specimen:  gallbladder  DISPOSITION OF SPECIMEN:  PATHOLOGY  COUNTS:  YES  TOURNIQUET:  * No tourniquets in log *  DICTATION: .Dragon Dictation    Procedure: After informed consent was obtained the patient was brought to the operating room and placed in the supine position on the operating room table. After adequate induction of general anesthesia the patient's abdomen was prepped with ChloraPrep allowed to dry and draped in usual sterile manner. An appropriate timeout was performed. The area above the umbilicus was infiltrated with quarter percent  Marcaine . A small incision was made with a 15 blade knife. The incision was carried down through the subcutaneous tissue bluntly with a hemostat and [employer] retractors. The linea alba was identified. The linea alba was incised with a 15 blade knife and each side was grasped with Coker clamps. The preperitoneal space was then probed with a hemostat until the peritoneum was opened and access was gained to the abdominal cavity. A 0 Vicryl pursestring stitch was placed in the fascia surrounding the opening. A Hassan cannula was then placed through the opening and anchored in place with the previously placed Vicryl purse string stitch. The abdomen was insufflated with carbon dioxide without difficulty. A laparoscope was inserted through the [facility] cannula in the right upper quadrant was inspected. Next the epigastric  region was infiltrated with % Marcaine . A small incision was made with a 15 blade knife. A 5 mm port was placed bluntly through this incision into the abdominal cavity under direct vision. Next 2 sites were chosen laterally on the right side of the abdomen for placement of 5 mm ports. Each of these areas was infiltrated with quarter percent Marcaine . Small stab incisions were made with a 15 blade knife. 5 mm ports were then placed bluntly through these incisions into the abdominal cavity under direct vision without difficulty. A blunt grasper was placed through the lateralmost 5 mm port and used to grasp the dome of the gallbladder and elevate it anteriorly and superiorly. Another blunt grasper was placed through the other 5 mm port and used to retract the body and neck of the gallbladder. A dissector was placed through the epigastric port and using the electrocautery the peritoneal reflection at the gallbladder neck was opened. Blunt dissection was then carried out in this area until the gallbladder neck-cystic duct junction was readily identified and a good critical window was created.  The indocyanine green  was used but we could not identify the ducts using this.  I could see the anatomy very well.  2 clips were placed proximally on the cystic duct and 1 distally and the duct was divided between the 2 sets of clips. Posterior to this the cystic artery was identified and again dissected bluntly in a circumferential manner until a good window  was created. 2 clips were placed proximally and one distally on the artery and the artery was divided between the 2  sets of clips. Next a laparoscopic hook cautery device was used to separate the gallbladder from the liver bed. Prior to completely detaching the gallbladder from the liver bed the liver bed was inspected and several small bleeding points were coagulated with the electrocautery until the area was completely hemostatic. The gallbladder was then detached the rest  of it from the liver bed without difficulty. A laparoscopic bag was inserted through the hassan port. The laparoscope was moved to the epigastric port. The gallbladder was placed within the bag and the bag was sealed.  The bag with the gallbladder was then removed with the [facility] cannula through the infraumbilical port without difficulty. The fascial defect was then closed with the previously placed Vicryl pursestring stitch as well as with another figure-of-eight 0 Vicryl stitch. The liver bed was inspected again and found to be hemostatic. The abdomen was irrigated with copious amounts of saline until the effluent was clear. The ports were then removed under direct vision without difficulty and were found to be hemostatic. The gas was allowed to escape. No other abnormalities were noted on general inspection of the abdomen. The skin incisions were all closed with interrupted 4-0 Monocryl subcuticular stitches. Dermabond dressings were applied. The patient tolerated the procedure well. At the end of the case all needle sponge and instrument counts were correct. The patient was then awakened and taken to recovery in stable condition  PLAN OF CARE: Discharge to home after PACU  PATIENT DISPOSITION:  PACU - hemodynamically stable.   Delay start of Pharmacological VTE agent (>24hrs) due to surgical blood loss or risk of bleeding: not applicable

## 2024-01-30 NOTE — H&P (Signed)
 REFERRING PHYSICIAN: Oneita Pfeiffer PROVIDER: DEWARD GARNETTE NULL, MD MRN: [id number] DOB: 1956-02-04 DATE OF ENCOUNTER: 12/14/2023 Subjective   Chief Complaint: New Consultation  History of Present Illness: Tamara Morgan is a 68 y.o. female who is seen today as an office consultation for evaluation of New Consultation  We are asked to see the patient in consultation by Dr. Bernardino Collier to evaluate her for gallstones. The patient is a 68 year old white female who has had known gallstones for about 40 years. She typically has painful attack about once a month. She experiences right upper quadrant pain that radiates into her back. The pain is associated with significant nausea and vomiting. She underwent ultrasound that did show stones in the gallbladder but no gallbladder wall thickening or ductal dilatation. She is on Eliquis  and is followed by a cardiologist at [facility]  Review of Systems: A complete review of systems was obtained from the patient. I have reviewed this information and discussed as appropriate with the patient. See HPI as well for other ROS.  ROS   Medical History: Past Medical History:  Diagnosis Date  Anemia  Anxiety  Arrhythmia  Arthritis  Asthma, unspecified asthma severity, unspecified whether complicated, unspecified whether persistent (HHS-HCC)  Diabetes mellitus without complication (CMS/HHS-HCC)  DVT (deep venous thrombosis) (CMS/HHS-HCC)  GERD (gastroesophageal reflux disease)  Glaucoma (increased eye pressure)  Heart valve disease  Hypertension  Sleep apnea  Thyroid  disease   Patient Active Problem List  Diagnosis  Calculus of gallbladder without cholecystitis without obstruction   Past Surgical History:  Procedure Laterality Date  CATARACT EXTRACTION  Foot surgery Right  GYN surgery    Allergies  Allergen Reactions  Aspirin Other (See Comments) and Swelling  Edema   Edema  Codeine Other (See Comments)  Cant talk or walk  Can't talk  or walk, Central nervous system  Central nervous system   Other reaction(s): Other Cant talk or walk Central nervous system  Cant talk or walk  Other reaction(s): Other Cant talk or walk  Diazepam Other (See Comments), Nausea and Nausea And Vomiting  Other reaction(s): Other Cant talk or walk Stop breathing   Cant talk or walk  Can't talk or walk, Stop breathing  Stop breathing   Other reaction(s): Other Cant talk or walk Stop breathing  Other reaction(s): Other Cant talk or walk Stop breathing  Cant talk or walk  Other reaction(s): Other Cant talk or walk  Iodine Other (See Comments) and Rash  Latex Other (See Comments) and Rash  Other reaction(s): Unknown  Prednisolone Other (See Comments)  Vision changes, pressure in eyes changed.  Adhesive Other (See Comments)  Doxycycline  Other (See Comments)  Other Reaction(s): GI Intolerance, severe diarrhea  Flu Vac 2025 [id number](Pf) Other (See Comments)  Other Other (See Comments)  Premarin, parifon forte  Tramadol Other (See Comments)  Bacitracin Zinc-Polymyxin B Rash   Current Outpatient Medications on File Prior to Visit  Medication Sig Dispense Refill  apixaban  (ELIQUIS ) 5 mg tablet Take 5 mg by mouth 2 (two) times daily  azelastine (ASTELIN) 137 mcg nasal spray 1-2 SPRAY IN EACH NOSTRIL 2 TIMES A DAY AS NEEDED  b complex vitamins capsule Take 1 capsule by mouth once daily  calcium carbonate-vitamin D3 (OS-CAL 500+D) 500 mg-10 mcg (400 unit) tablet Take 1 tablet by mouth once daily  fluticasone propionate (FLONASE) 50 mcg/actuation nasal spray  losartan  (COZAAR ) 25 MG tablet TAKE 1 TABLET BY MOUTH TWICE A DAY HOLD IF BYSTOLIC BLOOD PRESSURE LESS THAN 115  oxyCODONE -acetaminophen  (PERCOCET)  5-325 mg tablet Take 1 tablet by mouth every 6 (six) hours as needed  pantoprazole  (PROTONIX ) 40 MG DR tablet Take 40 mg by mouth every evening  pregabalin  (LYRICA ) 75 MG capsule Take 75 mg by mouth 2 (two) times daily   VENTOLIN  HFA 90 mcg/actuation inhaler TAKE 2 PUFFS (INHALATION) EVERY 4 TO 6 HOURS (SHORTNESS OF BREATH OR WHEEZING) FOR 30 DAYS   No current facility-administered medications on file prior to visit.   Family History  Problem Relation Age of Onset  High blood pressure (Hypertension) Mother  Hyperlipidemia (Elevated cholesterol) Mother  Coronary Artery Disease (Blocked arteries around heart) Mother  Diabetes Father  Deep vein thrombosis (DVT or abnormal blood clot formation) Father  Coronary Artery Disease (Blocked arteries around heart) Father  Hyperlipidemia (Elevated cholesterol) Father  High blood pressure (Hypertension) Father  High blood pressure (Hypertension) Brother  Hyperlipidemia (Elevated cholesterol) Brother    Social History   Tobacco Use  Smoking Status Never  Smokeless Tobacco Never    Social History   Socioeconomic History  Marital status: Married  Tobacco Use  Smoking status: Never  Smokeless tobacco: Never  Substance and Sexual Activity  Alcohol use: Yes  Alcohol/week: 0.0 - 1.0 standard drinks of alcohol  Drug use: Never   Social Drivers of Corporate Investment Banker Strain: Low Risk (07/22/2023)  Received from [facility]  Overall Financial Resource Strain (CARDIA)  Difficulty of Paying Living Expenses: Not hard at all  Food Insecurity: Low Risk (08/04/2023)  Received from [facility]  Hunger Vital Sign  Within the past 12 months, you worried that your food would run out before you got money to buy more: Never true  Within the past 12 months, the food you bought just didn't last and you didn't have money to get more. : Never true  Transportation Needs: No Transportation Needs (08/04/2023)  Received from [employer] [facility]  In the past 12 months, has lack of reliable transportation kept you from medical appointments, meetings, work or from getting things needed for daily living? : No  Physical Activity: Inactive (07/22/2023)   Received from [facility]  Exercise Vital Sign  On average, how many days per week do you engage in moderate to strenuous exercise (like a brisk walk)?: 0 days  On average, how many minutes do you engage in exercise at this level?: 20 min  Stress: No Stress Concern Present (07/22/2023)  Received from [facility] of Occupational Health - Occupational Stress Questionnaire  Feeling of Stress : Only a little  Social Connections: Somewhat Isolated (07/22/2023)  Received from [facility]  Social Network  How would you rate your social network (family, work, friends)?: Restricted participation with some degree of social isolation  Housing Stability: Unknown (12/14/2023)  Housing Stability Vital Sign  Homeless in the Last Year: No   Objective:   Vitals:  12/14/23 1023  BP: 138/74  Pulse: 98  Temp: 37 C (98.6 F)  SpO2: 96%  Weight: (!) 121.5 kg (267 lb 12.8 oz)  Height: 172.7 cm (5' 8)  PainSc: 2  PainLoc: Abdomen   Body mass index is 40.72 kg/m.  Physical Exam Vitals reviewed.  Constitutional:  General: She is not in acute distress. Appearance: Normal appearance.  HENT:  Head: Normocephalic and atraumatic.  Right Ear: External ear normal.  Left Ear: External ear normal.  Nose: Nose normal.  Mouth/Throat:  Mouth: Mucous membranes are moist.  Pharynx: Oropharynx is clear.  Eyes:  General: No  scleral icterus. Extraocular Movements: Extraocular movements intact.  Conjunctiva/sclera: Conjunctivae normal.  Pupils: Pupils are equal, round, and reactive to light.  Cardiovascular:  Rate and Rhythm: Normal rate and regular rhythm.  Pulses: Normal pulses.  Heart sounds: Normal heart sounds.  Pulmonary:  Effort: Pulmonary effort is normal. No respiratory distress.  Breath sounds: Normal breath sounds.  Abdominal:  General: Bowel sounds are normal.  Palpations: Abdomen is soft.  Tenderness: There is no abdominal tenderness.  Comments: There is mild  right upper quadrant pain to palpation. There is no palpable mass.  Musculoskeletal:  General: No swelling, tenderness or deformity. Normal range of motion.  Cervical back: Normal range of motion and neck supple.  Skin: General: Skin is warm and dry.  Coloration: Skin is not jaundiced.  Neurological:  General: No focal deficit present.  Mental Status: She is alert and oriented to person, place, and time.  Psychiatric:  Mood and Affect: Mood normal.  Behavior: Behavior normal.     Labs, Imaging and Diagnostic Testing:  Assessment and Plan:   Diagnoses and all orders for this visit:  Calculus of gallbladder without cholecystitis without obstruction - [facility] Case Posting Request; Future   The patient appears to have symptomatic gallstones. Because of the risk of further painful episodes and possible pancreatitis I feel she would benefit from having her gallbladder removed. She would also like to have this done. I have discussed with her in detail the risks and benefits of the operation as well as some of the technical aspects including the risk of common bile duct injury and she understands and wishes to proceed. We will need cardiac clearance from her cardiologist at [facility]. Then we can proceed with surgical scheduling

## 2024-01-30 NOTE — Transfer of Care (Signed)
 Immediate Anesthesia Transfer of Care Note  Patient: Tamara Morgan  Procedure(s) Performed: LAPAROSCOPIC CHOLECYSTECTOMY WITH INDOCYANINE GREEN  (Abdomen)  Patient Location: PACU  Anesthesia Type:General  Level of Consciousness: awake, alert , oriented, and patient cooperative  Airway & Oxygen Therapy: Patient Spontanous Breathing and Patient connected to face mask oxygen  Post-op Assessment: Report given to RN and Post -op Vital signs reviewed and stable  Post vital signs: Reviewed and stable  Last Vitals:  Vitals Value Taken Time  BP 168/81 01/30/24 09:00  Temp    Pulse 64 01/30/24 09:02  Resp 13 01/30/24 09:02  SpO2 94 % 01/30/24 09:02  Vitals shown include unfiled device data.  Last Pain:  Vitals:   01/30/24 0611  TempSrc:   PainSc: 0-No pain         Complications: No notable events documented.

## 2024-01-30 NOTE — Anesthesia Preprocedure Evaluation (Addendum)
 Anesthesia Evaluation  Patient identified by MRN, date of birth, ID band Patient awake    Reviewed: Allergy & Precautions, NPO status , Patient's Chart, lab work & pertinent test results  History of Anesthesia Complications Negative for: history of anesthetic complications  Airway Mallampati: III  TM Distance: <3 FB Neck ROM: Full  Mouth opening: Limited Mouth Opening  Dental  (+) Teeth Intact, Dental Advisory Given   Pulmonary sleep apnea and Continuous Positive Airway Pressure Ventilation , neg COPD, Patient abstained from smoking.Not current smoker   breath sounds clear to auscultation       Cardiovascular Exercise Tolerance: Good METShypertension, Pt. on medications (-) CAD and (-) Past MI + dysrhythmias Atrial Fibrillation  Rhythm:Regular     Neuro/Psych neg Seizures negative neurological ROS  negative psych ROS   GI/Hepatic ,GERD  Medicated,,(+)     (-) substance abuse    Endo/Other    Class 3 obesityLab Results      Component                Value               Date                      HGBA1C                   6.1 (H)             02/11/2023             Renal/GU negative Renal ROSLab Results      Component                Value               Date                      NA                       139                 01/18/2024                K                        4.1                 01/18/2024                CO2                      25                  01/18/2024                GLUCOSE                  130 (H)             01/18/2024                BUN                      13                  01/18/2024  CREATININE               0.78                01/18/2024                CALCIUM                  10.4 (H)            01/18/2024                EGFR                     87                  03/25/2022                GFRNONAA                 >60                 01/18/2024                 Musculoskeletal  (+) Arthritis ,    Abdominal   Peds  Hematology negative hematology ROS (+) Lab Results      Component                Value               Date                      WBC                      6.5                 01/18/2024                HGB                      13.3                01/18/2024                HCT                      41.3                01/18/2024                MCV                      87.7                01/18/2024                PLT                      287                 01/18/2024              Anesthesia Other Findings 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  Reproductive/Obstetrics                              Anesthesia Physical Anesthesia Plan  ASA: 3  Anesthesia  Plan: General   Post-op Pain Management: Tylenol  PO (pre-op)*, Gabapentin  PO (pre-op)* and Toradol  IV (intra-op)*   Induction: Intravenous  PONV Risk Score and Plan: 4 or greater and Ondansetron , Dexamethasone  and Treatment may vary due to age or medical condition  Airway Management Planned: Oral ETT and Video Laryngoscope Planned  Additional Equipment: None  Intra-op Plan:   Post-operative Plan: Extubation in OR  Informed Consent: I have reviewed the patients History and Physical, chart, labs and discussed the procedure including the risks, benefits and alternatives for the proposed anesthesia with the patient or authorized representative who has indicated his/her understanding and acceptance.     Dental advisory given  Plan Discussed with: CRNA  Anesthesia Plan Comments:         Anesthesia Quick Evaluation

## 2024-01-30 NOTE — Anesthesia Procedure Notes (Signed)
 Procedure Name: Intubation Date/Time: 01/30/2024 7:57 AM  Performed by: Franchot Delon RAMAN, CRNAPre-anesthesia Checklist: Patient identified, Emergency Drugs available, Suction available and Patient being monitored Patient Re-evaluated:Patient Re-evaluated prior to induction Oxygen Delivery Method: Circle System Utilized Preoxygenation: Pre-oxygenation with 100% oxygen Induction Type: IV induction Ventilation: Mask ventilation without difficulty Laryngoscope Size: Glidescope and 3 Grade View: Grade I Tube type: Parker flex tip Tube size: 7.0 mm Number of attempts: 1 Airway Equipment and Method: Video-laryngoscopy and Patient positioned with wedge pillow Placement Confirmation: ETT inserted through vocal cords under direct vision, positive ETCO2 and breath sounds checked- equal and bilateral Secured at: 21 cm Tube secured with: Tape Dental Injury: Teeth and Oropharynx as per pre-operative assessment  Comments: Elective GlydeScope used for pt's history of 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 Today, pt with good oral opening without pain, pt ramped up, copious secretions (like reflux) noted.

## 2024-01-31 ENCOUNTER — Encounter (HOSPITAL_COMMUNITY): Payer: Self-pay | Admitting: General Surgery

## 2024-01-31 LAB — SURGICAL PATHOLOGY
